# Patient Record
Sex: Female | Born: 1995 | Race: White | Hispanic: No | Marital: Single | Smoking: Never smoker
Health system: Southern US, Community
[De-identification: ages and names within clinical notes are randomized; demographics above are authoritative.]

## PROBLEM LIST (undated history)

## (undated) DIAGNOSIS — T8859XA Other complications of anesthesia, initial encounter: Secondary | ICD-10-CM

## (undated) DIAGNOSIS — R51 Headache: Secondary | ICD-10-CM

## (undated) DIAGNOSIS — E063 Autoimmune thyroiditis: Secondary | ICD-10-CM

## (undated) DIAGNOSIS — F419 Anxiety disorder, unspecified: Secondary | ICD-10-CM

## (undated) DIAGNOSIS — G4700 Insomnia, unspecified: Secondary | ICD-10-CM

## (undated) DIAGNOSIS — N926 Irregular menstruation, unspecified: Secondary | ICD-10-CM

## (undated) DIAGNOSIS — G4730 Sleep apnea, unspecified: Secondary | ICD-10-CM

## (undated) DIAGNOSIS — Z803 Family history of malignant neoplasm of breast: Secondary | ICD-10-CM

## (undated) DIAGNOSIS — Z8719 Personal history of other diseases of the digestive system: Secondary | ICD-10-CM

## (undated) DIAGNOSIS — H539 Unspecified visual disturbance: Secondary | ICD-10-CM

## (undated) DIAGNOSIS — F909 Attention-deficit hyperactivity disorder, unspecified type: Secondary | ICD-10-CM

## (undated) DIAGNOSIS — K219 Gastro-esophageal reflux disease without esophagitis: Secondary | ICD-10-CM

## (undated) DIAGNOSIS — T7840XA Allergy, unspecified, initial encounter: Secondary | ICD-10-CM

## (undated) DIAGNOSIS — E669 Obesity, unspecified: Secondary | ICD-10-CM

## (undated) DIAGNOSIS — I10 Essential (primary) hypertension: Secondary | ICD-10-CM

## (undated) DIAGNOSIS — G932 Benign intracranial hypertension: Secondary | ICD-10-CM

## (undated) DIAGNOSIS — K2970 Gastritis, unspecified, without bleeding: Secondary | ICD-10-CM

## (undated) DIAGNOSIS — F32A Depression, unspecified: Secondary | ICD-10-CM

## (undated) HISTORY — PX: CHOLECYSTECTOMY: SHX55

## (undated) HISTORY — DX: Benign intracranial hypertension: G93.2

## (undated) HISTORY — PX: TONSILLECTOMY: SUR1361

## (undated) HISTORY — DX: Family history of malignant neoplasm of breast: Z80.3

## (undated) HISTORY — DX: Allergy, unspecified, initial encounter: T78.40XA

---
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
## 2014-09-16 NOTE — Op Note (Signed)
PATIENT NAME:  Martha [name] MR#:  [id number] DATE OF BIRTH:  08-17-1995  DATE OF PROCEDURE:  03/12/2012  PREOPERATIVE DIAGNOSIS: Chronic cholecystitis and cholelithiasis.   POSTOPERATIVE DIAGNOSIS: Chronic cholecystitis and cholelithiasis.   OPERATIVE PROCEDURE: Laparoscopic cholecystectomy with intraoperative cholangiograms.   SURGEON: Earline MayotteJeffrey W. Jermone Geister, [name]  ANESTHESIA: General endotracheal under Dr. Chipper OmanKephart  ESTIMATED BLOOD LOSS: Less than 5 mL.   CLINICAL NOTE: This 19 year old woman has had intermittent epigastric and right upper quadrant pain. Ultrasound showed evidence of cholelithiasis. She had previously undergone upper and lower endoscopy without a significant source finding for her symptoms and she was felt to be a candidate for elective cholecystectomy.   OPERATIVE NOTE: With the patient under adequate general endotracheal anesthesia and making use of knee high TED stockings and pneumatic compression stockings due to her morbid obesity for deep vein thrombosis prophylaxis, she underwent general endotracheal anesthesia without difficulty. The abdomen was prepped with ChloraPrep and draped. In Trendelenburg position, a Veress needle was placed through a transumbilical incision. After assuring intra-abdominal location with the hanging drop test, the abdomen was insufflated with CO2 at 12 mmHg pressure. A 10 mm step port was expanded and inspection showed no evidence of injury from initial port placement. An incidental finding was the identification of a patent processus vaginalis on the right side. The left side could not be visualized.   The patient was placed in reverse Trendelenburg position and rolled to the left. Gallbladder was placed on cephalad traction. Due to the generous volume of intra-abdominal adipose tissue a fifth port was placed along the anterior axillary line. A Peer retractor was used to displace the colon and duodenum medially. This allowed good visualization of the  neck of the gallbladder. The cystic duct was cleared and a cystic duct lymph node which was enlarged was removed and sent with the specimen for routine histology. Fluoroscopic cholangiograms were completed using 18 mL of one half strength Conray 60. Prompt filling of the right and left hepatic ducts and free flow into the duodenum without evidence of retained stones were seen. The cystic duct and two branches of the cystic artery were doubly clipped adjacent to the gallbladder. It was then removed from the liver bed making use of hook cautery dissection. It was placed in an Endo Catch bag due to the generous volume of adipose tissue to negotiate the gallbladder through the abdominal wall. The umbilical port site was gently spread and it was possible to deliver the gallbladder and multiple stones with the use of stone forceps. After re-establishing pneumoperitoneum the right upper quadrant was irrigated with lactated Ringer's solution. Excellent hemostasis was noted. The abdomen was then desufflated and ports removed under direct vision. Skin incisions were closed with 4-0 Vicryl subcuticular suture. Benzoin, Steri-Strips, Telfa, and Tegaderm dressing was then applied.   Patient tolerated procedure well and was taken to recovery room in stable condition.   ____________________________ Earline MayotteJeffrey W. Bastian Andreoli, [name] jwb:cms D: 03/12/2012 13:06:35 ET T: 03/12/2012 13:24:26 ET JOB#: [id number]  cc: Earline MayotteJeffrey W. Marven Veley, [name], <Dictator> Martha ParisElizabeth B. Cliffton AstersWhite, NP Christena DeemMartin U. Skulskie, [name]  Martha [name] ELECTRONICALLY SIGNED 03/12/2012 19:42

## 2015-02-23 ENCOUNTER — Encounter: Payer: Self-pay | Admitting: Emergency Medicine

## 2015-02-23 ENCOUNTER — Emergency Department
Admission: EM | Admit: 2015-02-23 | Discharge: 2015-02-23 | Disposition: A | Discharge status: Home / Self Care | Payer: BLUE CROSS/BLUE SHIELD | Attending: Emergency Medicine | Admitting: Emergency Medicine

## 2015-02-23 DIAGNOSIS — E11649 Type 2 diabetes mellitus with hypoglycemia without coma: Secondary | ICD-10-CM | POA: Insufficient documentation

## 2015-02-23 DIAGNOSIS — Z79899 Other long term (current) drug therapy: Secondary | ICD-10-CM | POA: Insufficient documentation

## 2015-02-23 DIAGNOSIS — E162 Hypoglycemia, unspecified: Secondary | ICD-10-CM

## 2015-02-23 DIAGNOSIS — R4182 Altered mental status, unspecified: Secondary | ICD-10-CM | POA: Diagnosis present

## 2015-02-23 HISTORY — DX: Insomnia, unspecified: G47.00

## 2015-02-23 LAB — COMPREHENSIVE METABOLIC PANEL
ALT: 29 U/L (ref 14–54)
ANION GAP: 9 (ref 5–15)
AST: 25 U/L (ref 15–41)
Albumin: 4 g/dL (ref 3.5–5.0)
Alkaline Phosphatase: 50 U/L (ref 38–126)
BUN: 8 mg/dL (ref 6–20)
CHLORIDE: 102 mmol/L (ref 101–111)
CO2: 28 mmol/L (ref 22–32)
CREATININE: 0.77 mg/dL (ref 0.44–1.00)
Calcium: 9.6 mg/dL (ref 8.9–10.3)
Glucose, Bld: 100 mg/dL — ABNORMAL HIGH (ref 65–99)
POTASSIUM: 3.7 mmol/L (ref 3.5–5.1)
SODIUM: 139 mmol/L (ref 135–145)
Total Bilirubin: 0.2 mg/dL — ABNORMAL LOW (ref 0.3–1.2)
Total Protein: 7.8 g/dL (ref 6.5–8.1)

## 2015-02-23 LAB — CBC
HEMATOCRIT: 38.8 % (ref 35.0–47.0)
HEMOGLOBIN: 12.3 g/dL (ref 12.0–16.0)
MCH: 26 pg (ref 26.0–34.0)
MCHC: 31.7 g/dL — ABNORMAL LOW (ref 32.0–36.0)
MCV: 82 fL (ref 80.0–100.0)
PLATELETS: 354 10*3/uL (ref 150–440)
RBC: 4.73 MIL/uL (ref 3.80–5.20)
RDW: 14.9 % — ABNORMAL HIGH (ref 11.5–14.5)
WBC: 11.4 10*3/uL — AB (ref 3.6–11.0)

## 2015-02-23 LAB — TSH: TSH: 2.405 u[IU]/mL (ref 0.350–4.500)

## 2015-02-23 LAB — GLUCOSE, CAPILLARY
GLUCOSE-CAPILLARY: 67 mg/dL (ref 65–99)
GLUCOSE-CAPILLARY: 63 mg/dL — AB (ref 65–99)
Glucose-Capillary: 39 mg/dL — CL (ref 65–99)
Glucose-Capillary: 90 mg/dL (ref 65–99)

## 2015-02-23 NOTE — ED Notes (Signed)
CBG-90, Dr. Huel Cote made aware.

## 2015-02-23 NOTE — Discharge Instructions (Signed)
Blood Glucose Monitoring °Monitoring your blood glucose (also know as blood sugar) helps you to manage your diabetes. It also helps you and your health care provider monitor your diabetes and determine how well your treatment plan is working. °WHY SHOULD YOU MONITOR YOUR BLOOD GLUCOSE? °· It can help you understand how food, exercise, and medicine affect your blood glucose. °· It allows you to know what your blood glucose is at any given moment. You can quickly tell if you are having low blood glucose (hypoglycemia) or high blood glucose (hyperglycemia). °· It can help you and your health care provider know how to adjust your medicines. °· It can help you understand how to manage an illness or adjust medicine for exercise. °WHEN SHOULD YOU TEST? °Your health care provider will help you decide how often you should check your blood glucose. This may depend on the type of diabetes you have, your diabetes control, or the types of medicines you are taking. Be sure to write down all of your blood glucose readings so that this information can be reviewed with your health care provider. See below for examples of testing times that your health care provider may suggest. °Type 1 Diabetes °· Test 4 times a day if you are in good control, using an insulin pump, or perform multiple daily injections. °· If your diabetes is not well controlled or if you are sick, you may need to monitor more often. °· It is a good idea to also monitor: °· Before and after exercise. °· Between meals and 2 hours after a meal. °· Occasionally between 2:00 a.m. and 3:00 a.m. °Type 2 Diabetes °· It can vary with each person, but generally, if you are on insulin, test 4 times a day. °· If you take medicines by mouth (orally), test 2 times a day. °· If you are on a controlled diet, test once a day. °· If your diabetes is not well controlled or if you are sick, you may need to monitor more often. °HOW TO MONITOR YOUR BLOOD GLUCOSE °Supplies  Needed °· Blood glucose meter. °· Test strips for your meter. Each meter has its own strips. You must use the strips that go with your own meter. °· A pricking needle (lancet). °· A device that holds the lancet (lancing device). °· A journal or log book to write down your results. °Procedure °· Wash your hands with soap and water. Alcohol is not preferred. °· Prick the side of your finger (not the tip) with the lancet. °· Gently milk the finger until a small drop of blood appears. °· Follow the instructions that come with your meter for inserting the test strip, applying blood to the strip, and using your blood glucose meter. °Other Areas to Get Blood for Testing °Some meters allow you to use other areas of your body (other than your finger) to test your blood. These areas are called alternative sites. The most common alternative sites are: °· The forearm. °· The thigh. °· The back area of the lower leg. °· The palm of the hand. °The blood flow in these areas is slower. Therefore, the blood glucose values you get may be delayed, and the numbers are different from what you would get from your fingers. Do not use alternative sites if you think you are having hypoglycemia. Your reading will not be accurate. Always use a finger if you are having hypoglycemia. Also, if you cannot feel your lows (hypoglycemia unawareness), always use your fingers for your   blood glucose checks. °ADDITIONAL TIPS FOR GLUCOSE MONITORING °· Do not reuse lancets. °· Always carry your supplies with you. °· All blood glucose meters have a 24-hour "hotline" number to call if you have questions or need help. °· Adjust (calibrate) your blood glucose meter with a control solution after finishing a few boxes of strips. °BLOOD GLUCOSE RECORD KEEPING °It is a good idea to keep a daily record or log of your blood glucose readings. Most glucose meters, if not all, keep your glucose records stored in the meter. Some meters come with the ability to download  your records to your home computer. Keeping a record of your blood glucose readings is especially helpful if you are wanting to look for patterns. Make notes to go along with the blood glucose readings because you might forget what happened at that exact time. Keeping good records helps you and your health care provider to work together to achieve good diabetes management.  °Document Released: 05/19/2003 Document Revised: 09/30/2013 Document Reviewed: 10/08/2012 °[employer]® Patient Information ©2015 [employer]. This information is not intended to replace advice given to you by your health care provider. Make sure you discuss any questions you have with your health care provider. ° °Hypoglycemia °Hypoglycemia occurs when the glucose in your blood is too low. Glucose is a type of sugar that is your body's main energy source. Hormones, such as insulin and glucagon, control the level of glucose in the blood. Insulin lowers blood glucose and glucagon increases blood glucose. Having too much insulin in your blood stream, or not eating enough food containing sugar, can result in hypoglycemia. Hypoglycemia can happen to people with or without diabetes. It can develop quickly and can be a medical emergency.  °CAUSES  °· Missing or delaying meals. °· Not eating enough carbohydrates at meals. °· Taking too much diabetes medicine. °· Not timing your oral diabetes medicine or insulin doses with meals, snacks, and exercise. °· Nausea and vomiting. °· Certain medicines. °· Severe illnesses, such as hepatitis, kidney disorders, and certain eating disorders. °· Increased activity or exercise without eating something extra or adjusting medicines. °· Drinking too much alcohol. °· A nerve disorder that affects body functions like your heart rate, blood pressure, and digestion (autonomic neuropathy). °· A condition where the stomach muscles do not function properly (gastroparesis). Therefore, medicines and food may not absorb  properly. °· Rarely, a tumor of the pancreas can produce too much insulin. °SYMPTOMS  °· Hunger. °· Sweating (diaphoresis). °· Change in body temperature. °· Shakiness. °· Headache. °· Anxiety. °· Lightheadedness. °· Irritability. °· Difficulty concentrating. °· Dry mouth. °· Tingling or numbness in the hands or feet. °· Restless sleep or sleep disturbances. °· Altered speech and coordination. °· Change in mental status. °· Seizures or prolonged convulsions. °· Combativeness. °· Drowsiness (lethargic). °· Weakness. °· Increased heart rate or palpitations. °· Confusion. °· Pale, gray skin color. °· Blurred or double vision. °· Fainting. °DIAGNOSIS  °A physical exam and medical history will be performed. Your caregiver may make a diagnosis based on your symptoms. Blood tests and other lab tests may be performed to confirm a diagnosis. Once the diagnosis is made, your caregiver will see if your signs and symptoms go away once your blood glucose is raised.  °TREATMENT  °Usually, you can easily treat your hypoglycemia when you notice symptoms. °· Check your blood glucose. If it is less than 70 mg/dl, take one of the following:   °¨ 3-4 glucose tablets.   °¨ ½ cup   juice.    cup regular soda.   1 cup skim milk.   -1 tube of glucose gel.   5-6 hard candies.   Avoid high-fat drinks or food that may delay a rise in blood glucose levels.  Do not take more than the recommended amount of sugary foods, drinks, gel, or tablets. Doing so will cause your blood glucose to go too high.   Wait 10-15 minutes and recheck your blood glucose. If it is still less than 70 mg/dl or below your target range, repeat treatment.   Eat a snack if it is more than 1 hour until your next meal.  There may be a time when your blood glucose may go so low that you are unable to treat yourself at home when you start to notice symptoms. You may need someone to help you. You may even faint or be unable to swallow. If you cannot  treat yourself, someone will need to bring you to the hospital.  HOME CARE INSTRUCTIONS  If you have diabetes, follow your diabetes management plan by:  Taking your medicines as directed.  Following your exercise plan.  Following your meal plan. Do not skip meals. Eat on time.  Testing your blood glucose regularly. Check your blood glucose before and after exercise. If you exercise longer or different than usual, be sure to check blood glucose more frequently.  Wearing your medical alert jewelry that says you have diabetes.  Identify the cause of your hypoglycemia. Then, develop ways to prevent the recurrence of hypoglycemia.  Do not take a hot bath or shower right after an insulin shot.  Always carry treatment with you. Glucose tablets are the easiest to carry.  If you are going to drink alcohol, drink it only with meals.  Tell friends or family members ways to keep you safe during a seizure. This may include removing hard or sharp objects from the area or turning you on your side.  Maintain a healthy weight. SEEK MEDICAL CARE IF:   You are having problems keeping your blood glucose in your target range.  You are having frequent episodes of hypoglycemia.  You feel you might be having side effects from your medicines.  You are not sure why your blood glucose is dropping so low.  You notice a change in vision or a new problem with your vision. SEEK IMMEDIATE MEDICAL CARE IF:   Confusion develops.  A change in mental status occurs.  The inability to swallow develops.  Fainting occurs. Document Released: 05/16/2005 Document Revised: 05/21/2013 Document Reviewed: 09/12/2011 [facility] Patient Information 2015 [place], [place]. This information is not intended to replace advice given to you by your health care provider. Make sure you discuss any questions you have with your health care provider.  Please return immediately if condition worsens. Please contact her primary  physician or the physician you were given for referral. If you have any specialist physicians involved in her treatment and plan please also contact them. Thank you for using [facility]. Please stop the Lamisil

## 2015-02-23 NOTE — ED Notes (Addendum)
Patient was given graham crackers and peanut butter. Patient is alert, pleasant. Patient's mother is with patient in the waiting room. Charge nurse is aware.

## 2015-02-23 NOTE — ED Notes (Addendum)
Pt reports last week at school she had an episode of being confused. She was writing and and then all then she didn't understand what she was writing. States, "I noticed I was writing jibberish" Pt reports she went home and told her mom. Pt denies pain, other sx's. Pt reports diabetes runs in the family so she thought maybe her sugar ran low. Denies HA, [place].

## 2015-02-23 NOTE — ED Notes (Signed)
Main lab notified to add TSH, spoke with Morrie Sheldon, states will add.

## 2015-02-24 NOTE — ED Provider Notes (Signed)
Time Seen: Approximately 1620 I have reviewed the triage notes  Chief Complaint: Altered Mental Status   History of Present Illness: Martha [name] is a 19 y.o. female *who describes some periodic episodes mainly at school when work where she intermittently feels confused and feels like she is going to pass out. She states one time she noticed that she was writing "" gibberish" while taking notes in class. Patient had her blood sugar checked prior to arrival which showed that it was slow. She states she was recently started on medication for fungus in her toenails. Medication turns out to be Lamisil. She states these episodes didn't start until after she had started taking Lamisil. She denies taking any other medications or insulin that would lower her blood sugar. She denies any chest pain or shortness of breath or heart palpitations with these episodes. No obvious witnessed seizure activity. Past Medical History  Diagnosis Date  . Anxiety   . Diabetes mellitus     pre-diabetic,told when younger  . ADHD (attention deficit hyperactivity disorder)     no meds taken  . Vision abnormalities     near sighted,wears glasses  . Obesity   . Irregular menstrual cycle     pt states she started BCP,unsure of name to help regulate cycle  . Headache(784.0)     chronic migraines, decrease since BCP started  . Insomnia     Patient Active Problem List   Diagnosis Date Noted  . Suicidal ideation 05/10/2011  . Depression 05/10/2011    Past Surgical History  Procedure Laterality Date  . Tonsillectomy      at 19yo  . Cholecystectomy    . Hernia repair      Past Surgical History  Procedure Laterality Date  . Tonsillectomy      at 19yo  . Cholecystectomy    . Hernia repair      Current Outpatient Rx  Name  Route  Sig  Dispense  Refill  . aspirin-acetaminophen-caffeine (EXCEDRIN MIGRAINE) 250-250-65 MG per tablet   Oral   Take 2 tablets by mouth every 6 (six) hours as needed. For  migraines.          . DULoxetine (CYMBALTA) 30 MG capsule   Oral   Take 90 mg by mouth every morning.           . norethindrone (JOLIVETTE) 0.35 MG tablet   Oral   Take 1 tablet by mouth daily.           Marland Kitchen zolpidem (AMBIEN) 10 MG tablet   Oral   Take 10 mg by mouth at bedtime.             Allergies:  Review of patient's allergies indicates no known allergies.  Family History: History reviewed. No pertinent family history.  Social History: Social History  Substance Use Topics  . Smoking status: Never Smoker   . Smokeless tobacco: None  . Alcohol Use: No     Review of Systems:   10 point review of systems was performed and was otherwise negative:  Constitutional: No fever Eyes: No visual disturbances ENT: No sore throat, ear pain Cardiac: No chest pain Respiratory: No shortness of breath, wheezing, or stridor Abdomen: No abdominal pain, no vomiting, No diarrhea Endocrine: No weight loss, No night sweats Extremities: No peripheral edema, cyanosis Skin: No rashes, easy bruising Neurologic: No focal weakness, trouble with speech or swollowing Urologic: No dysuria, Hematuria, or urinary frequency   Physical Exam:  ED Triage  Vitals  Enc Vitals Group     BP 02/23/15 1324 135/73 mmHg     Pulse Rate 02/23/15 1324 77     Resp 02/23/15 1324 18     Temp 02/23/15 1324 98.6 F (37 C)     Temp Source 02/23/15 1324 Oral     SpO2 02/23/15 1324 100 %     Weight 02/23/15 1324 325 lb (147.419 kg)     Height 02/23/15 1324  (1.753 m)     Head Cir --      Peak Flow --      Pain Score 02/23/15 1619 0     Pain Loc --      Pain Edu? --      Excl. in GC? --     General: Awake , Alert , and Oriented times 3; GCS 15 Head: Normal cephalic , atraumatic Eyes: Pupils equal , round, reactive to light Nose/Throat: No nasal drainage, patent upper airway without erythema or exudate.  Neck: Supple, Full range of motion, No anterior adenopathy or palpable thyroid  masses Lungs: Clear to ascultation without wheezes , rhonchi, or rales Heart: Regular rate, regular rhythm without murmurs , gallops , or rubs Abdomen: Soft, non tender without rebound, guarding , or rigidity; bowel sounds positive and symmetric in all 4 quadrants. No organomegaly .        Extremities: 2 plus symmetric pulses. No edema, clubbing or cyanosis Neurologic: normal ambulation, Motor symmetric without deficits, sensory intact Skin: warm, dry, no rashes   Labs:   All laboratory work was reviewed including any pertinent negatives or positives listed below:  Labs Reviewed  COMPREHENSIVE METABOLIC PANEL - Abnormal; Notable for the following:    Glucose, Bld 100 (*)    Total Bilirubin 0.2 (*)    All other components within normal limits  CBC - Abnormal; Notable for the following:    WBC 11.4 (*)    MCHC 31.7 (*)    RDW 14.9 (*)    All other components within normal limits  GLUCOSE, CAPILLARY - Abnormal; Notable for the following:    Glucose-Capillary 39 (*)    All other components within normal limits  GLUCOSE, CAPILLARY - Abnormal; Notable for the following:    Glucose-Capillary 63 (*)    All other components within normal limits  GLUCOSE, CAPILLARY  TSH  GLUCOSE, CAPILLARY  CBG MONITORING, ED   laboratory work reviewed shows some mild hypoglycemia otherwise no significant abnormalities    ED Course Patient's stay here was uneventful and her blood sugar was repeated periodically during her stay and showed to reach some form of steady state at of 100. Review of her medication (Lamisil) shows that he can have an side effect of hypoglycemia. Patient was advised to stop the medication and see if this does improve her situations. I did not see any other life-threatening cause for her near syncope at this time.    Assessment:  Hypoglycemia secondary to medication   Final Clinical Impression:   Final diagnoses:  Hypoglycemia     Plan:  Patient was advised to return  immediately if condition worsens. Patient was advised to follow up with her primary care physician or other specialized physicians involved and in their current assessment.            Jennye Moccasin, MD 02/24/15 [phone]

## 2015-07-06 DIAGNOSIS — K219 Gastro-esophageal reflux disease without esophagitis: Secondary | ICD-10-CM | POA: Insufficient documentation

## 2015-07-06 DIAGNOSIS — R030 Elevated blood-pressure reading, without diagnosis of hypertension: Secondary | ICD-10-CM | POA: Insufficient documentation

## 2015-10-07 DIAGNOSIS — K449 Diaphragmatic hernia without obstruction or gangrene: Secondary | ICD-10-CM | POA: Insufficient documentation

## 2016-10-25 ENCOUNTER — Telehealth: Payer: Self-pay

## 2016-10-25 NOTE — Telephone Encounter (Signed)
Pt called stating she was given Introvale and her normal is Scientist, research (physical sciences)Quasense.  Adv generic equivalent.

## 2016-10-25 NOTE — Telephone Encounter (Signed)
Pt called stating pharm gave her a totally different bcp than what she has been taking.  Wants to know if it's the same thing.  Left detailed msg to call back c the name of the med she recv'd and the name she was supposed to receive.

## 2016-12-27 ENCOUNTER — Other Ambulatory Visit: Payer: Self-pay | Admitting: Family Medicine

## 2016-12-27 DIAGNOSIS — R222 Localized swelling, mass and lump, trunk: Secondary | ICD-10-CM

## 2016-12-30 ENCOUNTER — Other Ambulatory Visit: Payer: Self-pay | Admitting: Family Medicine

## 2016-12-30 DIAGNOSIS — R229 Localized swelling, mass and lump, unspecified: Secondary | ICD-10-CM

## 2016-12-30 DIAGNOSIS — R222 Localized swelling, mass and lump, trunk: Secondary | ICD-10-CM

## 2017-01-02 ENCOUNTER — Ambulatory Visit
Admission: RE | Admit: 2017-01-02 | Discharge: 2017-01-02 | Disposition: A | Discharge status: Home / Self Care | Payer: BLUE CROSS/BLUE SHIELD | Source: Ambulatory Visit | Attending: Family Medicine | Admitting: Family Medicine

## 2017-01-02 DIAGNOSIS — R222 Localized swelling, mass and lump, trunk: Secondary | ICD-10-CM | POA: Insufficient documentation

## 2017-01-02 DIAGNOSIS — R229 Localized swelling, mass and lump, unspecified: Secondary | ICD-10-CM

## 2017-04-07 ENCOUNTER — Encounter: Payer: Self-pay | Admitting: Obstetrics and Gynecology

## 2017-04-07 ENCOUNTER — Ambulatory Visit (INDEPENDENT_AMBULATORY_CARE_PROVIDER_SITE_OTHER): Payer: BLUE CROSS/BLUE SHIELD | Admitting: Obstetrics and Gynecology

## 2017-04-07 VITALS — BP 126/82 | HR 94 | Ht 69.0 in | Wt 371.0 lb

## 2017-04-07 DIAGNOSIS — Z3041 Encounter for surveillance of contraceptive pills: Secondary | ICD-10-CM | POA: Diagnosis not present

## 2017-04-07 DIAGNOSIS — Z01419 Encounter for gynecological examination (general) (routine) without abnormal findings: Secondary | ICD-10-CM

## 2017-04-07 DIAGNOSIS — Z0000 Encounter for general adult medical examination without abnormal findings: Secondary | ICD-10-CM

## 2017-04-07 MED ORDER — INTROVALE 0.15-0.03 MG PO TABS: 1.0000 | ORAL_TABLET | Freq: Every day | ORAL | 4 refills | Status: DC

## 2017-04-07 NOTE — Progress Notes (Signed)
Gynecology Annual Exam  [place]: [place], [facility]  Chief Complaint:  Chief Complaint  Patient presents with  . Gynecologic Exam    History of Present Illness: Patient is a 21 y.o. No obstetric history on file. presents for annual exam. The patient has no complaints today.   LMP: No LMP recorded. Patient is not currently having periods (Reason: Oral contraceptives). Average Interval: regular, 91 days Duration of flow: 4 days Heavy Menses: no Clots: no Intermenstrual Bleeding: no Postcoital Bleeding: not applicable Dysmenorrhea: no  The patient has never been sexually active. She currently uses OCP (estrogen/progesterone) for contraception. She denies dyspareunia.  The patient does perform self breast exams.  There is notable family history of breast or ovarian cancer in her family.  The patient wears seatbelts: yes.  The patient has regular exercise: no.    The patient repots current symptoms of depression.  She is seeing a physiatrist regularly. She has had recent stressors including her mother needing emergency back surgery. She recently changed antidepressant and sleep aide medication and feels like her mood has improved.   Review of Systems: Review of Systems  Constitutional: Negative.   HENT: Negative.   Eyes: Negative.   Respiratory: Negative.   Cardiovascular: Negative.   Gastrointestinal: Negative.   Genitourinary: Negative.   Musculoskeletal: Negative.   Skin: Negative.   Neurological: Negative.   Endo/Heme/Allergies: Bruises/bleeds easily.  Psychiatric/Behavioral: Positive for depression. Negative for substance abuse and suicidal ideas. The patient does not have insomnia.     Past Medical History:  Past Medical History:  Diagnosis Date  . ADHD (attention deficit hyperactivity disorder)    no meds taken  . Anxiety   . Diabetes mellitus    pre-diabetic,told when younger  . Headache(784.0)    chronic migraines, decrease since BCP started  .  Insomnia   . Irregular menstrual cycle    pt states she started BCP,unsure of name to help regulate cycle  . Obesity   . Vision abnormalities    near sighted,wears glasses    Past Surgical History:  Past Surgical History:  Procedure Laterality Date  . CHOLECYSTECTOMY    . HERNIA REPAIR    . TONSILLECTOMY     at 21yo    Gynecologic History:  No LMP recorded. Patient is not currently having periods (Reason: Oral contraceptives). Contraception: OCP (estrogen/progesterone) Last Pap: Results were: NIL   Obstetric History: No obstetric history on file.  Family History:  No family history on file.  Social History:  Social History   Socioeconomic History  . Marital status: Single    Spouse name: Not on file  . Number of children: Not on file  . Years of education: Not on file  . Highest education level: Not on file  Social Needs  . Financial resource strain: Not on file  . Food insecurity - worry: Not on file  . Food insecurity - inability: Not on file  . Transportation needs - medical: Not on file  . Transportation needs - non-medical: Not on file  Occupational History  . Occupation: Consulting civil engineertudent    Comment: [employer] 12th grade  Tobacco Use  . Smoking status: Never Smoker  . Smokeless tobacco: Never Used  Substance and Sexual Activity  . Alcohol use: Yes    Comment: occ  . Drug use: No  . Sexual activity: Not Currently    Partners: Male    Birth control/protection: Pill  Other Topics Concern  . Not on file  Social  History Narrative  . Not on file    Allergies:  Allergies  Allergen Reactions  . Lamisil [Terbinafine Hcl]     Medications: Prior to Admission medications   Medication Sig Start Date End Date Taking? Authorizing Provider  aspirin-acetaminophen-caffeine (EXCEDRIN MIGRAINE) [phone] MG per tablet Take 2 tablets by mouth every 6 (six) hours as needed. For migraines.    Yes [provider]  desvenlafaxine (PRISTIQ) 50 MG 24 hr tablet  Take by mouth.   Yes [provider]  INTROVALE 0.15-0.03 MG tablet TK 1 T PO  QD FOR 91 DAYS 01/19/17  Yes [provider]  lansoprazole (PREVACID) 30 MG capsule Take by mouth. 07/04/16 07/04/17 Yes [provider]  lansoprazole (PREVACID) 30 MG capsule  04/01/17  Yes [provider]  methylphenidate (RITALIN) 5 MG tablet Take by mouth.   Yes [provider]  zaleplon (SONATA) 10 MG capsule TK 2 CS PO QHS 03/29/17  Yes [provider]    Physical Exam Vitals: Blood pressure 126/82, pulse 94, height 5\' 9"  (1.753 m), weight (!) 371 lb (168.3 kg).  General: NAD HEENT: normocephalic, anicteric Thyroid: no enlargement, no palpable nodules Pulmonary: No increased work of breathing, CTAB Cardiovascular: RRR, distal pulses 2+ Breast: Breast symmetrical, no tenderness, no palpable nodules or masses, no skin or nipple retraction present, no nipple discharge.  No axillary or supraclavicular lymphadenopathy. Abdomen: NABS, soft, non-tender, non-distended.  Umbilicus without lesions.  No hepatomegaly, splenomegaly or masses palpable. No evidence of hernia  Genitourinary:  External: Normal external female genitalia.  Normal urethral meatus, normal  Bartholin's and Skene's glands.    Vagina: Normal vaginal mucosa, no evidence of prolapse.    Uterus: Non-enlarged, mobile, normal contour.  No CMT  Adnexa: ovaries non-enlarged, no adnexal masses  Rectal: deferred  Lymphatic: no evidence of inguinal lymphadenopathy Extremities: no edema, erythema, or tenderness Neurologic: Grossly intact Psychiatric: mood appropriate, affect full  Female chaperone present for pelvic and breast  portions of the physical exam    Assessment: 21 y.o. No obstetric history on file. routine annual exam  Plan: Problem List Items Addressed This Visit    None      1) 4) Gardasil Series discussed and if applicable offered to patient - Patient has previously completed 3  shot series   2) STI screening was offered and declined   3) ASCCP guidelines and rational discussed.  Patient opts for every 3 years screening interval  4) Contraception -will continue current contraception method   5) Follow up 1 year for routine annual exam

## 2017-04-17 ENCOUNTER — Other Ambulatory Visit: Payer: Self-pay | Admitting: Advanced Practice Midwife

## 2017-09-26 ENCOUNTER — Emergency Department
Admission: EM | Admit: 2017-09-26 | Discharge: 2017-09-26 | Disposition: A | Discharge status: Home / Self Care | Payer: 59 | Attending: Emergency Medicine | Admitting: Emergency Medicine

## 2017-09-26 ENCOUNTER — Other Ambulatory Visit: Payer: Self-pay

## 2017-09-26 ENCOUNTER — Encounter: Payer: Self-pay | Admitting: *Deleted

## 2017-09-26 DIAGNOSIS — Z79899 Other long term (current) drug therapy: Secondary | ICD-10-CM | POA: Insufficient documentation

## 2017-09-26 DIAGNOSIS — L509 Urticaria, unspecified: Secondary | ICD-10-CM | POA: Insufficient documentation

## 2017-09-26 MED ORDER — PREDNISONE 50 MG PO TABS: 50.0000 mg | ORAL_TABLET | Freq: Every day | ORAL | 0 refills | Status: DC

## 2017-09-26 MED ORDER — METHYLPREDNISOLONE SODIUM SUCC 125 MG IJ SOLR
125.0000 mg | Freq: Once | INTRAMUSCULAR | Status: AC
  Administered 2017-09-26: 125 mg via INTRAMUSCULAR
  Filled 2017-09-26: qty 2

## 2017-09-26 MED ORDER — FAMOTIDINE 20 MG PO TABS
20.0000 mg | ORAL_TABLET | Freq: Once | ORAL | Status: AC
  Administered 2017-09-26: 20 mg via ORAL
  Filled 2017-09-26: qty 1

## 2017-09-26 MED ORDER — HYDROXYZINE PAMOATE 50 MG PO CAPS: 50.0000 mg | ORAL_CAPSULE | Freq: Three times a day (TID) | ORAL | 0 refills | Status: DC | PRN

## 2017-09-26 MED ORDER — DIPHENHYDRAMINE HCL 50 MG/ML IJ SOLN
50.0000 mg | Freq: Once | INTRAMUSCULAR | Status: AC
  Administered 2017-09-26: 50 mg via INTRAMUSCULAR
  Filled 2017-09-26: qty 1

## 2017-09-26 NOTE — ED Provider Notes (Signed)
[facility] Emergency Department Provider Note  ____________________________________________  Time seen: Approximately 10:23 PM  I have reviewed the triage vital signs and the nursing notes.   HISTORY  Chief Complaint Rash    HPI Martha [name] is a 22 y.o. female who presents emergency department complaining of hives.  Patient reports that over the past couple months she has had several outbreaks of hives with unknown trigger.  Last occurrence happened several months ago.  Patient was given a course of steroids which improved symptoms and she was placed on daily Zyrtec.  Until now, no return of symptoms.  No new foods, medications, beauty products.  Patient denies any angioedema-like symptoms, shortness of breath or difficulty breathing.  Patient reports that hives have improved without medication but she still is pruritic.  No other complaints at this time.  Past Medical History:  Diagnosis Date  . ADHD (attention deficit hyperactivity disorder)    no meds taken  . Anxiety   . Diabetes mellitus    pre-diabetic,told when younger  . Headache(784.0)    chronic migraines, decrease since BCP started  . Insomnia   . Irregular menstrual cycle    pt states she started BCP,unsure of name to help regulate cycle  . Obesity   . Vision abnormalities    near sighted,wears glasses    Patient Active Problem List   Diagnosis Date Noted  . Suicidal ideation 05/10/2011  . Depression 05/10/2011    Past Surgical History:  Procedure Laterality Date  . CHOLECYSTECTOMY    . TONSILLECTOMY     at 22yo    Prior to Admission medications   Medication Sig Start Date End Date Taking? Authorizing Provider  aspirin-acetaminophen-caffeine (EXCEDRIN MIGRAINE) [phone] MG per tablet Take 2 tablets by mouth every 6 (six) hours as needed. For migraines.     [provider]  desvenlafaxine (PRISTIQ) 50 MG 24 hr tablet Take by mouth.    [provider]   hydrOXYzine (VISTARIL) 50 MG capsule Take 1 capsule (50 mg total) by mouth 3 (three) times daily as needed for itching. 09/26/17   Cuthriell, Delorise Royals, PA-C  INTROVALE 0.15-0.03 MG tablet Take 1 tablet daily by mouth. 04/07/17   Schuman, Jaquelyn Bitter, MD  lansoprazole (PREVACID) 30 MG capsule Take by mouth. 07/04/16 07/04/17  [provider]  lansoprazole (PREVACID) 30 MG capsule  04/01/17   [provider]  methylphenidate (RITALIN) 5 MG tablet Take by mouth.    [provider]  predniSONE (DELTASONE) 50 MG tablet Take 1 tablet (50 mg total) by mouth daily with breakfast. 09/26/17   Cuthriell, Delorise Royals, PA-C  zaleplon (SONATA) 10 MG capsule TK 2 CS PO QHS 03/29/17   [provider]    Allergies Lamisil [terbinafine]  Family History  Problem Relation Age of Onset  . Thyroid cancer Mother 37  . Leukemia Paternal Aunt   . Breast cancer Paternal Aunt 40  . Skin cancer Maternal Grandfather     Social History Social History   Tobacco Use  . Smoking status: Never Smoker  . Smokeless tobacco: Never Used  Substance Use Topics  . Alcohol use: Yes    Comment: occ  . Drug use: No     Review of Systems  Constitutional: No fever/chills Eyes: No visual changes. No discharge ENT:  no swelling or itching of the airway Cardiovascular: no chest pain. Respiratory: no cough. No SOB. Gastrointestinal: No abdominal pain.  No nausea, no vomiting.  No diarrhea.  No  constipation. Musculoskeletal: Negative for musculoskeletal pain. Skin: Positive for hives to the upper extremities, chest Neurological: Negative for headaches, focal weakness or numbness. 10-point ROS otherwise negative.  ____________________________________________   PHYSICAL EXAM:  VITAL SIGNS: ED Triage Vitals  Enc Vitals Group     BP 09/26/17 2027 (!) 167/87     Pulse Rate 09/26/17 2027 92     Resp 09/26/17 2027 20     Temp 09/26/17 2023 98.6 F (37 C)     Temp Source 09/26/17 2023  Oral     SpO2 09/26/17 2027 99 %     Weight 09/26/17 2023 (!) 400 lb (181.4 kg)     Height 09/26/17 2023  (1.753 m)     Head Circumference --      Peak Flow --      Pain Score 09/26/17 2026 0     Pain Loc --      Pain Edu? --      Excl. in GC? --      Constitutional: Alert and oriented. Well appearing and in no acute distress. Eyes: Conjunctivae are normal. PERRL. EOMI. Head: Atraumatic. ENT:      Ears:       Nose: No congestion/rhinnorhea.      Mouth/Throat: Mucous membranes are moist.  No angioedema or oropharyngeal erythema or edema.  Neck: No stridor.   Cardiovascular: Normal rate, regular rhythm. Normal S1 and S2.  Good peripheral circulation. Respiratory: Normal respiratory effort without tachypnea or retractions. Lungs CTAB. Good air entry to the bases with no decreased or absent breath sounds. Musculoskeletal: Full range of motion to all extremities. No gross deformities appreciated. Neurologic:  Normal speech and language. No gross focal neurologic deficits are appreciated.  Skin:  Skin is warm, dry and intact.  Wheals noted to bilateral upper extremities, chest region.  These are not erythematous.  No pustules, open lesions.  Signs of infection.  No drainage. Psychiatric: Mood and affect are normal. Speech and behavior are normal. Patient exhibits appropriate insight and judgement.   ____________________________________________   LABS (all labs ordered are listed, but only abnormal results are displayed)  Labs Reviewed - No data to display ____________________________________________  EKG   ____________________________________________  RADIOLOGY   No results found.  ____________________________________________    PROCEDURES  Procedure(s) performed:    Procedures    Medications  methylPREDNISolone sodium succinate (SOLU-MEDROL) 125 mg/2 mL injection 125 mg (has no administration in time range)  diphenhydrAMINE (BENADRYL) injection 50 mg (has  no administration in time range)  famotidine (PEPCID) tablet 20 mg (has no administration in time range)     ____________________________________________   INITIAL IMPRESSION / ASSESSMENT AND PLAN / ED COURSE  Pertinent labs & imaging results that were available during my care of the patient were reviewed by me and considered in my medical decision making (see chart for details).  Review of the [place] CSRS was performed in accordance of the [facility] prior to dispensing any controlled drugs.     Patient's diagnosis is consistent with hives.  Patient presents with onset of hives.  No known trigger.  This is occurred several times in the past.  At this time, patient reports that she has been advised that if this recurs, she should follow-up with ENT for further management.  At this time, she will be given Solu-Medrol, diphenhydramine, Zantac.Marland Kitchen Patient will be discharged home with prescriptions for 5-day course of prednisone and Vistaril. Patient is to follow up with dermatology as needed or otherwise directed.  Patient is given ED precautions to return to the ED for any worsening or new symptoms.     ____________________________________________  FINAL CLINICAL IMPRESSION(S) / ED DIAGNOSES  Final diagnoses:  Hives      NEW MEDICATIONS STARTED DURING THIS VISIT:  ED Discharge Orders        Ordered    hydrOXYzine (VISTARIL) 50 MG capsule  3 times daily PRN     09/26/17 2225    predniSONE (DELTASONE) 50 MG tablet  Daily with breakfast     09/26/17 2225          This chart was dictated using voice recognition software/Dragon. Despite best efforts to proofread, errors can occur which can change the meaning. Any change was purely unintentional.    Racheal Patches, PA-C 09/26/17 2228    Minna Antis, MD 09/26/17 [phone]

## 2017-09-26 NOTE — ED Triage Notes (Signed)
Pt reports she noticed itching on her left thigh when she woke up this morning and this afternoon started having generalized rash and hives. She says that about 2 months ago she had similar episode and was seen by her PCP for the same.

## 2018-04-11 ENCOUNTER — Encounter: Payer: Self-pay | Admitting: Obstetrics and Gynecology

## 2018-04-11 ENCOUNTER — Other Ambulatory Visit (HOSPITAL_COMMUNITY)
Admission: RE | Admit: 2018-04-11 | Discharge: 2018-04-11 | Disposition: A | Discharge status: Home / Self Care | Payer: BLUE CROSS/BLUE SHIELD | Source: Ambulatory Visit | Attending: Obstetrics and Gynecology | Admitting: Obstetrics and Gynecology

## 2018-04-11 ENCOUNTER — Ambulatory Visit: Payer: BLUE CROSS/BLUE SHIELD | Admitting: Obstetrics and Gynecology

## 2018-04-11 ENCOUNTER — Ambulatory Visit (INDEPENDENT_AMBULATORY_CARE_PROVIDER_SITE_OTHER): Payer: BLUE CROSS/BLUE SHIELD | Admitting: Obstetrics and Gynecology

## 2018-04-11 VITALS — BP 130/80 | HR 98 | Ht 69.0 in | Wt 334.0 lb

## 2018-04-11 DIAGNOSIS — Z3009 Encounter for other general counseling and advice on contraception: Secondary | ICD-10-CM

## 2018-04-11 DIAGNOSIS — Z124 Encounter for screening for malignant neoplasm of cervix: Secondary | ICD-10-CM | POA: Diagnosis present

## 2018-04-11 DIAGNOSIS — Z0000 Encounter for general adult medical examination without abnormal findings: Secondary | ICD-10-CM

## 2018-04-11 MED ORDER — INTROVALE 0.15-0.03 MG PO TABS: 1.0000 | ORAL_TABLET | Freq: Every day | ORAL | 4 refills | Status: DC

## 2018-04-11 NOTE — Progress Notes (Signed)
Gynecology Annual Exam  [facility]  Chief Complaint:  Chief Complaint  Patient presents with  . Gynecologic Exam    Headache x 3 days     History of Present Illness: Patient is a 22 y.o. No obstetric history on file. presents for annual exam. The patient has no complaints today. Review of Systems: ROS  Past Medical History:  Past Medical History:  Diagnosis Date  . ADHD (attention deficit hyperactivity disorder)    no meds taken  . Anxiety   . Diabetes mellitus    pre-diabetic,told when younger  . Headache(784.0)    chronic migraines, decrease since BCP started  . Insomnia   . Irregular menstrual cycle    pt states she started BCP,unsure of name to help regulate cycle  . Obesity   . Vision abnormalities    near sighted,wears glasses    Past Surgical History:  Past Surgical History:  Procedure Laterality Date  . CHOLECYSTECTOMY    . TONSILLECTOMY     at 22yo    Gynecologic History:  No LMP recorded. (Menstrual status: Oral contraceptives).  Obstetric History: No obstetric history on file.  Family History:  Family History  Problem Relation Age of Onset  . Thyroid cancer Mother 17  . Leukemia Paternal Aunt   . Breast cancer Paternal Aunt 40  . Skin cancer Maternal Grandfather     Social History:  Social History   Socioeconomic History  . Marital status: Single    Spouse name: Not on file  . Number of children: Not on file  . Years of education: Not on file  . Highest education level: Not on file  Occupational History  . 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 Financial resource strain: Not on file  . Food insecurity:    Worry: Not on file    Inability: Not on file  . Transportation needs:    Medical: Not on file    Non-medical: Not on file  Tobacco Use  . Smoking status: Never Smoker  . Smokeless tobacco: Never Used  Substance and Sexual Activity  . Alcohol use: Yes    Comment:  occ  . Drug use: No  . Sexual activity: Not Currently    Partners: Male    Birth control/protection: Pill  Lifestyle  . Physical activity:    Days per week: 0 days    Minutes per session: 0 min  . Stress: To some extent  Relationships  . Social connections:    Talks on phone: Once a week    Gets together: More than three times a week    Attends religious service: Never    Active member of club or organization: Yes    Attends meetings of clubs or organizations: More than 4 times per year    Relationship status: Never married  . Intimate partner violence:    Fear of current or ex partner: No    Emotionally abused: No    Physically abused: No    Forced sexual activity: No  Other Topics Concern  . Not on file  Social History Narrative  . Not on file    Allergies:  Allergies  Allergen Reactions  . Lamisil [Terbinafine]     Medications: Prior to Admission medications   Medication Sig Start Date End Date Taking? Authorizing Provider  aspirin-acetaminophen-caffeine (EXCEDRIN MIGRAINE) [phone] MG per tablet Take 2 tablets by mouth every 6 (six) hours as needed.  For migraines.    Yes [provider]  cetirizine (ZYRTEC) 10 MG tablet TK 1 T PO BID 10/03/17  Yes [provider]  Cyanocobalamin (VITAMIN B-12) 2000 MCG TBCR Take by mouth. 07/26/17 07/26/18 Yes [provider]  desvenlafaxine (PRISTIQ) 50 MG 24 hr tablet Take by mouth.   Yes [provider]  diazepam (VALIUM) 5 MG tablet TK 1 TO 2 TS PO HS PRF INSOMNIA 12/08/17  Yes [provider]  hydrOXYzine (VISTARIL) 50 MG capsule Take 1 capsule (50 mg total) by mouth 3 (three) times daily as needed for itching. 09/26/17  Yes Cuthriell, Delorise Royals, PA-C  INTROVALE 0.15-0.03 MG tablet Take 1 tablet by mouth daily. 04/11/18  Yes Kaylinn Dedic, Jaquelyn Bitter, MD  lansoprazole (PREVACID) 30 MG capsule  04/01/17  Yes [provider]  meloxicam (MOBIC) 15 MG tablet TK 1 T PO ONCE A DAY 01/23/18   Yes [provider]  methylphenidate (RITALIN) 5 MG tablet Take by mouth.   Yes [provider]  pantoprazole (PROTONIX) 40 MG tablet  04/02/18  Yes [provider]  spironolactone (ALDACTONE) 100 MG tablet TK 1 T PO QD B NOON 04/02/18  Yes [provider]  tiZANidine (ZANAFLEX) 4 MG tablet  01/23/18  Yes [provider]  Vitamin D, Ergocalciferol, (DRISDOL) 1.25 MG (50000 UT) CAPS capsule Take by mouth. 11/17/17  Yes [provider]  zaleplon (SONATA) 10 MG capsule TK 2 CS PO QHS 03/29/17  Yes [provider]  lansoprazole (PREVACID) 30 MG capsule Take by mouth. 07/04/16 07/04/17  [provider]  predniSONE (DELTASONE) 50 MG tablet Take 1 tablet (50 mg total) by mouth daily with breakfast. Patient not taking: Reported on 04/11/2018 09/26/17   Cuthriell, Delorise Royals, PA-C    Physical Exam Vitals: Blood pressure 130/80, pulse 98, height 5\' 9"  (1.753 m), weight (!) 334 lb (151.5 kg).  General: NAD HEENT: normocephalic, anicteric Thyroid: no enlargement, no palpable nodules Pulmonary: No increased work of breathing, CTAB Cardiovascular: RRR, distal pulses 2+ Breast: Breast symmetrical, no tenderness, no palpable nodules or masses, no skin or nipple retraction present, no nipple discharge.  No axillary or supraclavicular lymphadenopathy. Abdomen: NABS, soft, non-tender, non-distended.  Umbilicus without lesions.  No hepatomegaly, splenomegaly or masses palpable. No evidence of hernia  Genitourinary:  External: Normal external female genitalia.  Normal urethral meatus, normal Bartholin's and Skene's glands.    Vagina: Normal vaginal mucosa, no evidence of prolapse.    Cervix: Grossly normal in appearance, no bleeding  Uterus: Non-enlarged, mobile, normal contour.  No CMT  Adnexa: ovaries non-enlarged, no adnexal masses  Rectal: deferred  Lymphatic: no evidence of inguinal lymphadenopathy Extremities: no edema, erythema, or  tenderness Neurologic: Grossly intact Psychiatric: mood appropriate, affect full  Female chaperone present for pelvic and breast  portions of the physical exam    Assessment: 22 y.o. No obstetric history on file. routine annual exam  Plan: Problem List Items Addressed This Visit    None    Visit Diagnoses    Encounter for annual physical exam    -  Primary   Relevant Medications   INTROVALE 0.15-0.03 MG tablet   Screening for cervical cancer       Relevant Orders   Cytology - PAP   Birth control counseling       Relevant Medications   INTROVALE 0.15-0.03 MG tablet     1) STI screening  wasoffered and declined  3)  ASCCP guidelines and rational discussed.  Patient  opts for every 3 years screening interval  4) Contraception - the patient is currently using  OCP (estrogen/progesterone).  She is happy with her current form of contraception and plans to continue We discussed safe sex practices to reduce her furture risk of STI's.    5) Return in about 1 year (around 04/12/2019) for annual.   6) Patient having headache- discussed neurology and posibility of referral Birth control refilled  Emotional support given .   Adelene Idlerhristanna Sharyn Brilliant MD [facility], [facility] 04/11/18 10:18 PM

## 2018-04-13 LAB — CYTOLOGY - PAP: DIAGNOSIS: NEGATIVE

## 2018-04-16 NOTE — Progress Notes (Signed)
Please call patient with normal result.

## 2018-08-28 DIAGNOSIS — J301 Allergic rhinitis due to pollen: Secondary | ICD-10-CM | POA: Insufficient documentation

## 2018-11-09 ENCOUNTER — Other Ambulatory Visit: Payer: Self-pay | Admitting: Neurology

## 2018-11-09 DIAGNOSIS — R519 Headache, unspecified: Secondary | ICD-10-CM

## 2018-11-20 ENCOUNTER — Encounter (HOSPITAL_COMMUNITY): Payer: Self-pay

## 2018-11-20 ENCOUNTER — Other Ambulatory Visit (HOSPITAL_COMMUNITY): Payer: BLUE CROSS/BLUE SHIELD

## 2018-11-20 ENCOUNTER — Ambulatory Visit (HOSPITAL_COMMUNITY): Payer: BLUE CROSS/BLUE SHIELD

## 2019-03-24 ENCOUNTER — Other Ambulatory Visit: Payer: Self-pay

## 2019-03-24 ENCOUNTER — Ambulatory Visit
Admission: EM | Admit: 2019-03-24 | Discharge: 2019-03-24 | Disposition: A | Discharge status: Home / Self Care | Payer: BC Managed Care – PPO | Attending: Emergency Medicine | Admitting: Emergency Medicine

## 2019-03-24 DIAGNOSIS — J019 Acute sinusitis, unspecified: Secondary | ICD-10-CM

## 2019-03-24 DIAGNOSIS — F419 Anxiety disorder, unspecified: Secondary | ICD-10-CM

## 2019-03-24 DIAGNOSIS — Z20828 Contact with and (suspected) exposure to other viral communicable diseases: Secondary | ICD-10-CM

## 2019-03-24 DIAGNOSIS — Z20822 Contact with and (suspected) exposure to covid-19: Secondary | ICD-10-CM

## 2019-03-24 MED ORDER — FLUTICASONE PROPIONATE 50 MCG/ACT NA SUSP: 2.0000 | Freq: Every day | NASAL | 2 refills | Status: DC

## 2019-03-24 MED ORDER — AMOXICILLIN 500 MG PO CAPS: 500.0000 mg | ORAL_CAPSULE | Freq: Three times a day (TID) | ORAL | 0 refills | Status: DC

## 2019-03-24 NOTE — ED Triage Notes (Signed)
Patient complains of possible allergic reaction. Patient states that she is concerned that her shortness of breath is from her pristiq. States that she has been on the medication for years but increased her medication x 2 months ago. Patient states that she is also having sinus drainage that she thinks is unrelated since it started this week. Patient states that she did a telehealth visit on Thursday and was told it was allergies but she said she thinks it a sinus infection and would like treatment. Unsure of any covid exposure so she would like to be swabbed.

## 2019-03-24 NOTE — ED Provider Notes (Signed)
[facility] [place]   Name: Martha [name] DOB: [birth year] MRN: [id number] CSN: [id number] [place]  Arrival date and time:  03/24/19 1205  Chief Complaint:  Shortness of Breath   NOTE: Prior to seeing the patient today, I have reviewed the triage nursing documentation and vital signs. Clinical staff has updated patient's PMH/PSHx, current medication list, and drug allergies/intolerances to ensure comprehensive history available to assist in medical decision making.   History:   HPI: Martha [name] is a 23 y.o. female who presents today with complaints of sinus pressure and shortness of breath. Her sinus pressure started on Tuesday and has not gotten better with oral or intranasal antihistaminic. Her congestion increases when laying down. She denies headaches and fever; associated symptoms include ear pressure and nasal drainage.   Her shortness of breath started last night. She attributes her shortness of breath to an increase of anxiety and depression. She has had multiple losses of life within her family in a short period of time.  She works at the [employer] and is requesting a COVID-19 test during this visit.  Past Medical History:  Diagnosis Date  . ADHD (attention deficit hyperactivity disorder)    no meds taken  . Anxiety   . Diabetes mellitus    pre-diabetic,told when younger  . Headache(784.0)    chronic migraines, decrease since BCP started  . Insomnia   . Irregular menstrual cycle    pt states she started BCP,unsure of name to help regulate cycle  . Obesity   . Vision abnormalities    near sighted,wears glasses    Past Surgical History:  Procedure Laterality Date  . CHOLECYSTECTOMY    . TONSILLECTOMY     at 23yo    Family History  Problem Relation Age of Onset  . Thyroid cancer Mother 7640  . Leukemia Paternal Aunt   . Breast cancer Paternal Aunt 40  . Skin cancer Maternal Grandfather     Social History    Tobacco Use  . Smoking status: Never Smoker  . Smokeless tobacco: Never Used  Substance Use Topics  . Alcohol use: Yes    Comment: occ  . Drug use: No    Patient Active Problem List   Diagnosis Date Noted  . Suicidal ideation 05/10/2011  . Depression 05/10/2011    Home Medications:    Current Meds  Medication Sig  . aspirin-acetaminophen-caffeine (EXCEDRIN MIGRAINE) 250-250-65 MG per tablet Take 2 tablets by mouth every 6 (six) hours as needed. For migraines.   . cetirizine (ZYRTEC) 10 MG tablet TK 1 T PO BID  . desvenlafaxine (PRISTIQ) 100 MG 24 hr tablet Take 100 mg by mouth.   . diazepam (VALIUM) 5 MG tablet TK 1 TO 2 TS PO HS PRF INSOMNIA  . INTROVALE 0.15-0.03 MG tablet Take 1 tablet by mouth daily.  . lansoprazole (PREVACID) 30 MG capsule   . meloxicam (MOBIC) 15 MG tablet TK 1 T PO ONCE A DAY  . methylphenidate (RITALIN) 5 MG tablet Take by mouth.  . spironolactone (ALDACTONE) 100 MG tablet TK 1 T PO QD B NOON  . tiZANidine (ZANAFLEX) 4 MG tablet   . Vitamin D, Ergocalciferol, (DRISDOL) 1.25 MG (50000 UT) CAPS capsule Take by mouth.  . zaleplon (SONATA) 10 MG capsule TK 2 CS PO QHS    Allergies:   Lamisil [terbinafine]  Review of Systems (ROS): Review of Systems  Constitutional: Negative for activity change, appetite change,  chills, diaphoresis, fatigue and fever.  HENT: Positive for congestion, ear pain, postnasal drip, sinus pressure, sinus pain and sore throat. Negative for ear discharge, facial swelling, hearing loss and sneezing.   Eyes: Negative for pain.  Respiratory: Positive for cough. Negative for shortness of breath and wheezing.   Gastrointestinal: Positive for nausea. Negative for constipation and vomiting.  Musculoskeletal: Negative for myalgias.  All other systems reviewed and are negative.    Vital Signs: Today's Vitals   03/24/19 1225 03/24/19 1232 03/24/19 1258  BP:  (!) 149/98   Pulse:  90   Resp:  16   Temp:  98.2 F (36.8 C)    TempSrc:  Oral   SpO2:  97%   Weight: (!) 350 lb (158.8 kg)    Height: 5\' 9"  (1.753 m)    PainSc: 0-No pain  0-No pain    Physical Exam: Physical Exam Vitals signs and nursing note reviewed.  HENT:     Head: Normocephalic.     Right Ear: No middle ear effusion.     Left Ear: Tympanic membrane normal.  No middle ear effusion.     Nose: Congestion present.     Right Sinus: No maxillary sinus tenderness or frontal sinus tenderness.     Left Sinus: Maxillary sinus tenderness and frontal sinus tenderness present.     Mouth/Throat:     Mouth: Mucous membranes are moist.     Pharynx: Posterior oropharyngeal erythema present.     Tonsils: No tonsillar exudate or tonsillar abscesses.  Neck:     Musculoskeletal: Normal range of motion.  Cardiovascular:     Rate and Rhythm: Normal rate and regular rhythm.     Heart sounds: Normal heart sounds.  Pulmonary:     Effort: Pulmonary effort is normal.     Breath sounds: Normal breath sounds.  Lymphadenopathy:     Cervical: No cervical adenopathy.  Skin:    General: Skin is warm and dry.  Neurological:     Mental Status: She is alert.      Urgent Care Treatments / Results:   LABS: PLEASE NOTE: all labs that were ordered this encounter are listed, however only abnormal results are displayed. Labs Reviewed  NOVEL CORONAVIRUS, NAA (HOSP ORDER, SEND-OUT TO REF LAB; TAT 18-24 HRS)    EKG: -None  RADIOLOGY: No results found.  PROCEDURES: Procedures  MEDICATIONS RECEIVED THIS VISIT: Medications - No data to display  PERTINENT CLINICAL COURSE NOTES/UPDATES:   Initial Impression / Assessment and Plan / Urgent Care Course:  Pertinent labs & imaging results that were available during my care of the patient were personally reviewed by me and considered in my medical decision making (see lab/imaging section of note for values and interpretations).  Martha [name] is a 23 y.o. female who presents to [facility] today with  complaints of sinus pressure and shortness of breath, diagnosed with sinusitis andanxiety, and treated as such with the medications below. NP and patient reviewed discharge instructions below during visit.   Patient is well appearing overall in clinic today. She does not appear to be in any acute distress. Presenting symptoms (see HPI) and exam as documented above.   I have reviewed the follow up and strict return precautions for any new or worsening symptoms. Patient is aware of symptoms that would be deemed urgent/emergent, and would thus require further evaluation either here or in the emergency department. At the time of discharge, she verbalized understanding and consent with the discharge plan  as it was reviewed with her. All questions were fielded by provider and/or clinic staff prior to patient discharge.    Final Clinical Impressions / Urgent Care Diagnoses:   Final diagnoses:  Acute non-recurrent sinusitis, unspecified location  Anxiety  Encounter for screening laboratory testing for COVID-19 virus in asymptomatic patient    New Prescriptions:  [place] Controlled Substance Registry consulted? Not Applicable  Meds ordered this encounter  Medications  . amoxicillin (AMOXIL) 500 MG capsule    Sig: Take 1 capsule (500 mg total) by mouth 3 (three) times daily.    Dispense:  30 capsule    Refill:  0  . fluticasone (FLONASE) 50 MCG/ACT nasal spray    Sig: Place 2 sprays into both nostrils daily.    Dispense:  18.2 mL    Refill:  2      Discharge Instructions     You are being treated for a sinus infection.  Take the antibiotics as prescribed until they're finished. If you think you're having a reaction, stop the medication, take benadryl and go to the nearest urgent care/emergency room. Take a probiotic while taking the antibiotic to decrease the chances of stomach upset.   Follow-up with your therapist regarding your increased anxiety/depression.   You were tested for COVID-19  during this visit. You should restrict activities outside your home, except for getting medical care. Do not go to work, school, or public areas, and do not use public transportation or taxis until you are notified of a negative test.       Recommended Follow up Care:  Patient encouraged to follow up with the following provider within the specified time frame, or sooner as dictated by the severity of her symptoms. As always, she was instructed that for any urgent/emergent care needs, she should seek care either here or in the emergency department for more immediate evaluation.   Gertie Baron, DNP, NP-c   Gertie Baron, NP 03/24/19 [phone]

## 2019-03-24 NOTE — Discharge Instructions (Signed)
You are being treated for a sinus infection.  Take the antibiotics as prescribed until they're finished. If you think you're having a reaction, stop the medication, take benadryl and go to the nearest urgent care/emergency room. Take a probiotic while taking the antibiotic to decrease the chances of stomach upset.   Follow-up with your therapist regarding your increased anxiety/depression.   You were tested for COVID-19 during this visit. You should restrict activities outside your home, except for getting medical care. Do not go to work, school, or public areas, and do not use public transportation or taxis until you are notified of a negative test.

## 2019-03-26 LAB — NOVEL CORONAVIRUS, NAA (HOSP ORDER, SEND-OUT TO REF LAB; TAT 18-24 HRS): SARS-CoV-2, NAA: NOT DETECTED

## 2019-04-15 ENCOUNTER — Encounter: Payer: Self-pay | Admitting: Obstetrics and Gynecology

## 2019-04-15 ENCOUNTER — Ambulatory Visit (INDEPENDENT_AMBULATORY_CARE_PROVIDER_SITE_OTHER): Payer: BC Managed Care – PPO | Admitting: Obstetrics and Gynecology

## 2019-04-15 ENCOUNTER — Other Ambulatory Visit: Payer: Self-pay

## 2019-04-15 DIAGNOSIS — Z3009 Encounter for other general counseling and advice on contraception: Secondary | ICD-10-CM

## 2019-04-15 DIAGNOSIS — Z01419 Encounter for gynecological examination (general) (routine) without abnormal findings: Secondary | ICD-10-CM

## 2019-04-15 DIAGNOSIS — Z0000 Encounter for general adult medical examination without abnormal findings: Secondary | ICD-10-CM

## 2019-04-15 MED ORDER — INTROVALE 0.15-0.03 MG PO TABS: 1.0000 | ORAL_TABLET | Freq: Every day | ORAL | 4 refills | Status: DC

## 2019-04-15 NOTE — Progress Notes (Signed)
Gynecology Annual Exam  PCP: [facility]  Chief Complaint:  Chief Complaint  Patient presents with  . Gynecologic Exam    History of Present Illness: Patient is a 23 y.o. G0P0000 presents for annual exam. The patient has no complaints today.   LMP: Patient's last menstrual period was 01/14/2019 (approximate). Menarche:not applicable Average Interval: regular, 90 days Duration of flow: 5 days Heavy Menses: no Clots: no Intermenstrual Bleeding: no Postcoital Bleeding: no Dysmenorrhea: no  The patient is not sexually active. She currently uses OCP (estrogen/progesterone) for contraception. She denies dyspareunia.  The patient does perform self breast exams.  There is notable family history of breast or ovarian cancer in her family.  The patient wears seatbelts: no.  The patient has regular exercise: yes.    The patient denies current symptoms of depression.    Review of Systems: Review of Systems  Constitutional: Negative for chills, fever, malaise/fatigue and weight loss.  HENT: Negative for congestion, hearing loss and sinus pain.   Eyes: Negative for blurred vision and double vision.  Respiratory: Negative for cough, sputum production, shortness of breath and wheezing.   Cardiovascular: Negative for chest pain, palpitations, orthopnea and leg swelling.  Gastrointestinal: Negative for abdominal pain, constipation, diarrhea, nausea and vomiting.  Genitourinary: Negative for dysuria, flank pain, frequency, hematuria and urgency.  Musculoskeletal: Negative for back pain, falls and joint pain.  Skin: Negative for itching and rash.  Neurological: Negative for dizziness and headaches.  Psychiatric/Behavioral: Negative for depression, substance abuse and suicidal ideas. The patient is not nervous/anxious.     Past Medical History:  Past Medical History:  Diagnosis Date  . ADHD (attention deficit hyperactivity disorder)    no meds taken  . Anxiety   .  Diabetes mellitus    pre-diabetic,told when younger  . Headache(784.0)    chronic migraines, decrease since BCP started  . Idiopathic intracranial hypertension   . Insomnia   . Irregular menstrual cycle    pt states she started BCP,unsure of name to help regulate cycle  . Obesity   . Vision abnormalities    near sighted,wears glasses    Past Surgical History:  Past Surgical History:  Procedure Laterality Date  . CHOLECYSTECTOMY    . TONSILLECTOMY     at [place] History:  Patient's last menstrual period was 01/14/2019 (approximate). Contraception: OCP (estrogen/progesterone) Last Pap: Results were: NIL 2019  Obstetric History: G0P0000  Family History:  Family History  Problem Relation Age of Onset  . Thyroid cancer Mother 50  . Leukemia Paternal Aunt   . Breast cancer Paternal Aunt 107  . Skin cancer Maternal Grandfather     Social History:  Social History   Socioeconomic History  . Marital status: Single    Spouse name: Not on file  . Number of children: Not on file  . Years of education: Not on file  . Highest education level: Not on file  Occupational History  . Occupation: Ship broker    Comment: [employer] 12th grade  Social Needs  . Financial resource strain: Not on file  . Food insecurity    Worry: Not on file    Inability: Not on file  . Transportation needs    Medical: Not on file    Non-medical: Not on file  Tobacco Use  . Smoking status: Never Smoker  . Smokeless tobacco: Never Used  Substance and Sexual Activity  . Alcohol use: Yes    Comment: occ  .  Drug use: No  . Sexual activity: Not Currently    Partners: Male    Birth control/protection: Pill  Lifestyle  . Physical activity    Days per week: 0 days    Minutes per session: 0 min  . Stress: To some extent  Relationships  . Social Musicianconnections    Talks on phone: Once a week    Gets together: More than three times a week    Attends religious service: Never    Active  member of club or organization: Yes    Attends meetings of clubs or organizations: More than 4 times per year    Relationship status: Never married  . Intimate partner violence    Fear of current or ex partner: No    Emotionally abused: No    Physically abused: No    Forced sexual activity: No  Other Topics Concern  . Not on file  Social History Narrative  . Not on file    Allergies:  Allergies  Allergen Reactions  . Lamisil [Terbinafine]     Medications: Prior to Admission medications   Medication Sig Start Date End Date Taking? Authorizing Provider  aspirin-acetaminophen-caffeine (EXCEDRIN MIGRAINE) [phone] MG per tablet Take 2 tablets by mouth every 6 (six) hours as needed. For migraines.    Yes [provider]  busPIRone (BUSPAR) 10 MG tablet Take 10 mg by mouth 2 (two) times daily. 04/08/19  Yes [provider]  cetirizine (ZYRTEC) 10 MG tablet TK 1 T PO BID 10/03/17  Yes [provider]  Cholecalciferol (VITAMIN D-1000 MAX ST) 25 MCG (1000 UT) tablet Take by mouth.   Yes [provider]  desvenlafaxine (PRISTIQ) 100 MG 24 hr tablet Take 100 mg by mouth.    Yes [provider]  diazepam (VALIUM) 5 MG tablet TK 1 TO 2 TS PO HS PRF INSOMNIA 12/08/17  Yes [provider]  INTROVALE 0.15-0.03 MG tablet Take 1 tablet by mouth daily. 04/11/18  Yes Marcellus Pulliam, Jaquelyn Bitterhristanna R, MD  lansoprazole (PREVACID) 30 MG capsule  04/01/17  Yes [provider]  meloxicam (MOBIC) 15 MG tablet TK 1 T PO ONCE A DAY 01/23/18  Yes [provider]  methylphenidate (RITALIN) 10 MG tablet Take 10 mg by mouth daily. 04/02/19  Yes [provider]  Multiple Vitamin (MULTI-VITAMIN) tablet Take by mouth.   Yes [provider]  pantoprazole (PROTONIX) 40 MG tablet  04/02/18  Yes [provider]  predniSONE (DELTASONE) 50 MG tablet Take 1 tablet (50 mg total) by mouth daily with breakfast. 09/26/17  Yes Cuthriell, Delorise RoyalsJonathan  D, PA-C  spironolactone (ALDACTONE) 100 MG tablet TK 1 T PO QD B NOON 04/02/18  Yes [provider]  tiZANidine (ZANAFLEX) 4 MG tablet  01/23/18  Yes [provider]  topiramate (TOPAMAX) 50 MG tablet 50 mg in the morning and 100 mg at night. 12/13/18  Yes [provider]  vitamin B-12 (CYANOCOBALAMIN) 1000 MCG tablet Take by mouth.   Yes [provider]  zaleplon (SONATA) 10 MG capsule TK 2 CS PO QHS 03/29/17  Yes [provider]    Physical Exam Vitals: Blood pressure 130/80, height 5\' 8"  (1.727 m), weight (!) 343 lb (155.6 kg), last menstrual period 01/14/2019.  General: NAD HEENT: normocephalic, anicteric Thyroid: no enlargement, no palpable nodules Pulmonary: No increased work of breathing, CTAB Cardiovascular: RRR, distal pulses 2+ Breast: Breast symmetrical, no tenderness, no palpable nodules or masses, no skin or nipple retraction present, no nipple discharge.  No axillary or supraclavicular lymphadenopathy. Abdomen: NABS, soft, non-tender, non-distended.  Umbilicus without lesions.  No hepatomegaly, splenomegaly or masses palpable. No evidence of hernia  Genitourinary:  External: Normal external female genitalia.  Normal urethral meatus, normal Bartholin's and Skene's glands.    Vagina: Normal vaginal mucosa, no evidence of prolapse.    Cervix: Grossly normal in appearance, no bleeding  Uterus: Non-enlarged, mobile, normal contour.  No CMT  Adnexa: ovaries non-enlarged, no adnexal masses  Rectal: deferred  Lymphatic: no evidence of inguinal lymphadenopathy Extremities: no edema, erythema, or tenderness Neurologic: Grossly intact Psychiatric: mood appropriate, affect full  Female chaperone present for pelvic and breast  portions of the physical exam    Assessment: 23 y.o. G0P0000 routine annual exam  Plan: Problem List Items Addressed This Visit    None    Visit Diagnoses    Encounter for annual physical exam       Relevant  Medications   INTROVALE 0.15-0.03 MG tablet   Birth control counseling       Relevant Medications   INTROVALE 0.15-0.03 MG tablet      1)  STI screening  was offered and declined  2)  ASCCP guidelines and rational discussed.  Patient opts for every 3 years screening interval  3) Contraception - the patient is currently using  OCP (estrogen/progesterone).  She is happy with her current form of contraception and plans to continue We discussed safe sex practices to reduce her furture risk of STI's.    Refill sent for birth control  4) Discussed depression and anxiety- she is following up with her PCP actively for this issue every month and it is managed by them. Stable.   5) Return in about 1 year (around 04/14/2020) for annual.   Adelene Idler MD [facility], [facility] 04/15/2019 11:33 AM

## 2019-04-15 NOTE — Patient Instructions (Signed)
Institute of Medicine Recommended Dietary Allowances for Calcium and Vitamin D  Age (yr) Calcium Recommended Dietary Allowance (mg/day) Vitamin D Recommended Dietary Allowance (international units/day)  9-18 1,300 600  19-50 1,000 600  51-70 1,200 600  71 and older 1,200 800  Data from Institute of Medicine. Dietary reference intakes: calcium, vitamin D. [place], [place]: National Academies Press; 2011.    

## 2019-05-15 ENCOUNTER — Encounter: Payer: Self-pay | Admitting: Obstetrics and Gynecology

## 2019-05-15 NOTE — Telephone Encounter (Signed)
This encounter was created in error - please disregard.

## 2019-07-10 ENCOUNTER — Ambulatory Visit: Admit: 2019-07-10 | Payer: BC Managed Care – PPO | Admitting: Internal Medicine

## 2019-07-10 SURGERY — ESOPHAGOGASTRODUODENOSCOPY (EGD) WITH PROPOFOL
Anesthesia: General

## 2019-08-01 IMAGING — US US ABDOMEN LIMITED
1 series · 14 of 15 positions shown · non-contrast
Comparison: None

CLINICAL DATA: Superficial lump in abdominal wall of RIGHT abdomen

EXAM:
LIMITED ULTRASOUND OF ABDOMINAL SOFT TISSUES
TECHNIQUE: Ultrasound examination of the abdominal wall soft tissues was
performed in the area of clinical concern.

[Series 1: us abdomen limited · 0.07mm/px · 15 acquisitions, 14 frames shown]
[im 1/15]
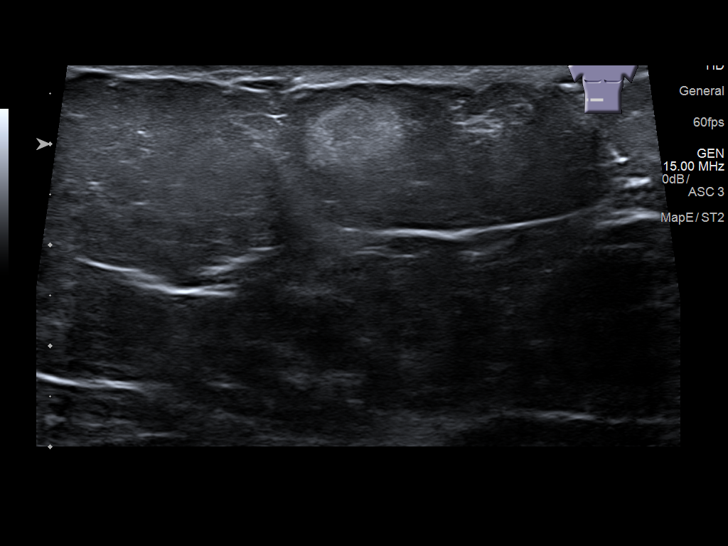
[im 2/15]
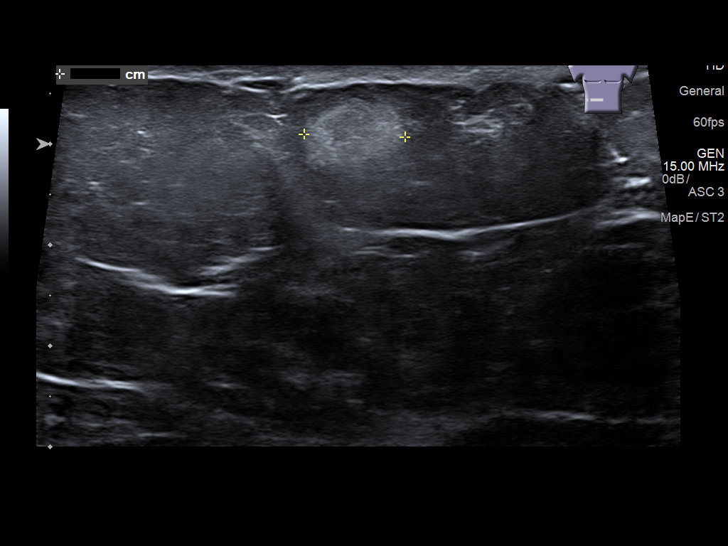
[im 3/15]
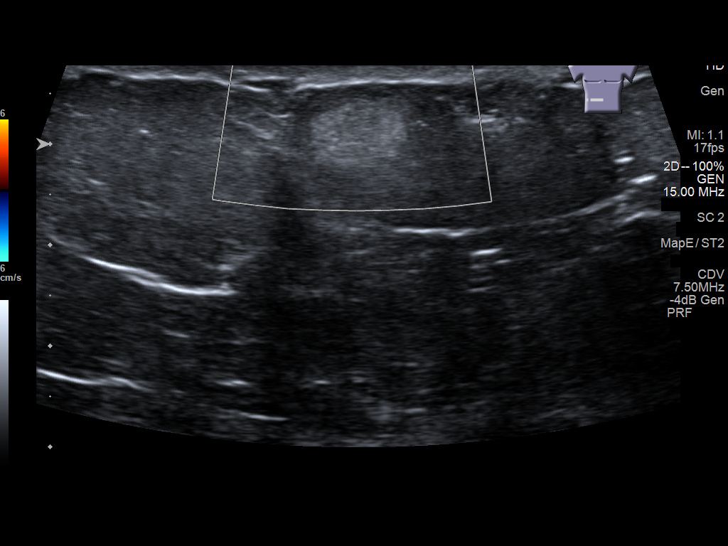
[im 4/15]
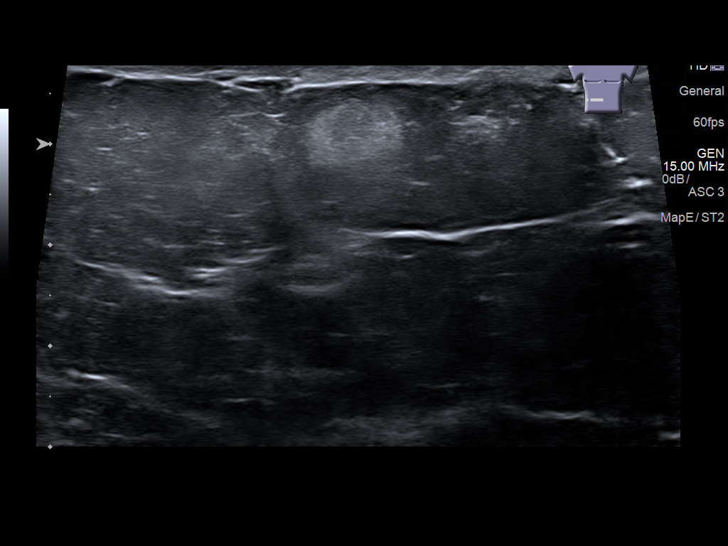
[im 5/15]
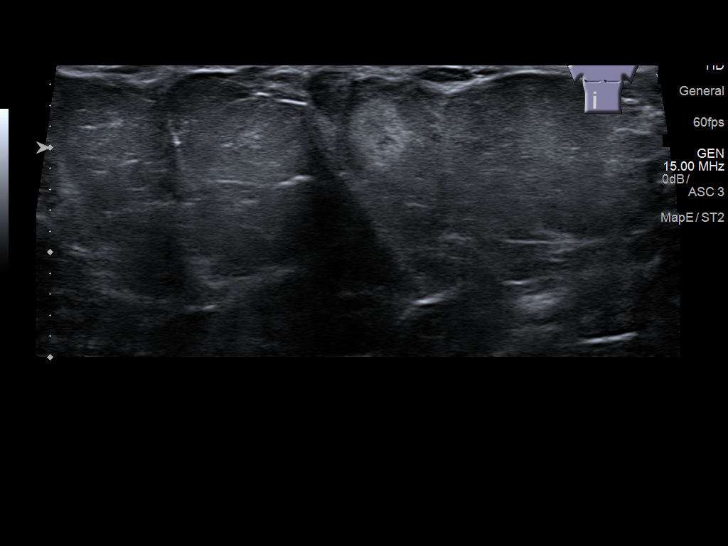
[im 6/15]
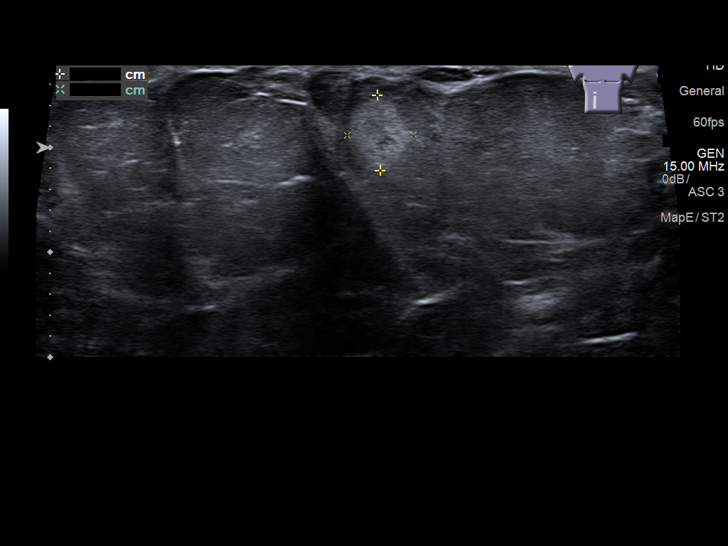
[im 7/15]
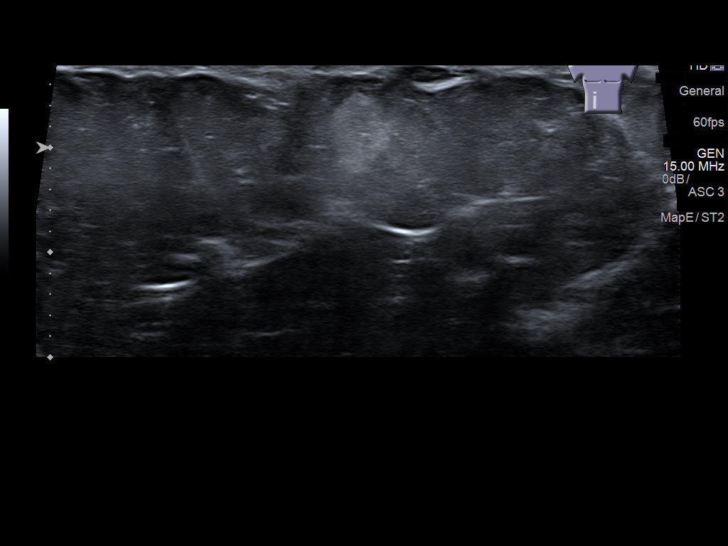
[im 9/15]
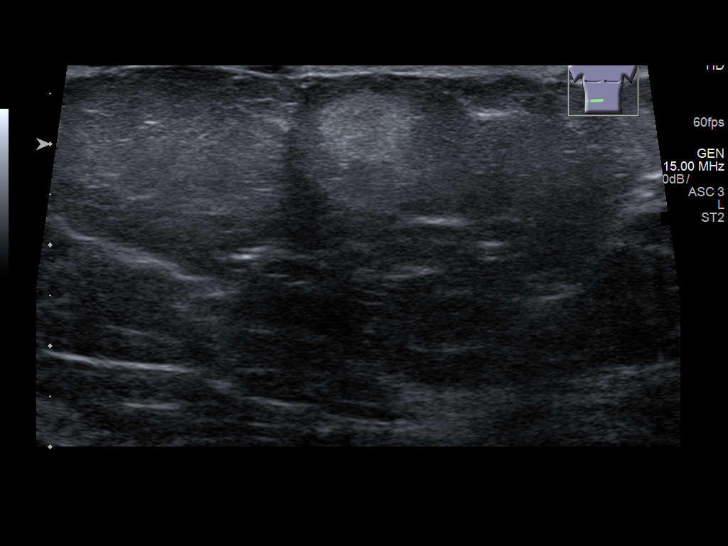
[im 10/15]
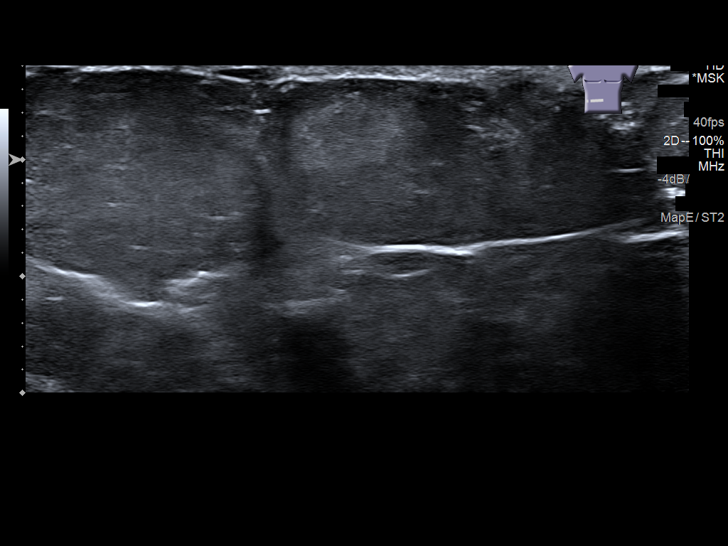
[im 11/15]
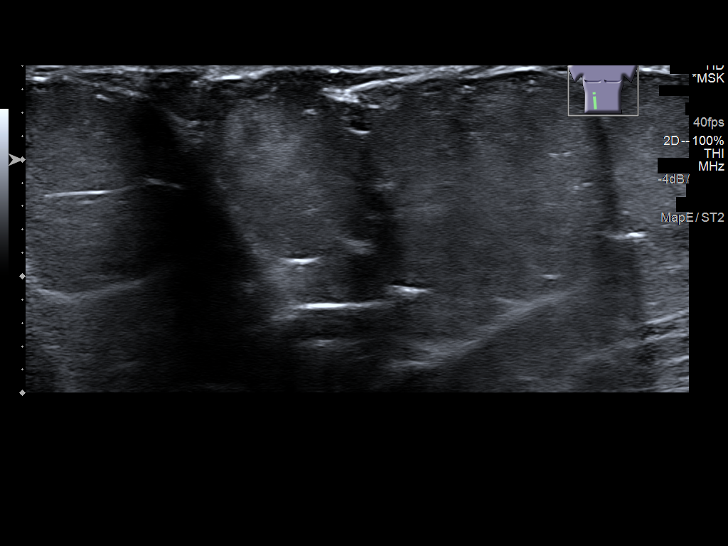
[im 12/15]
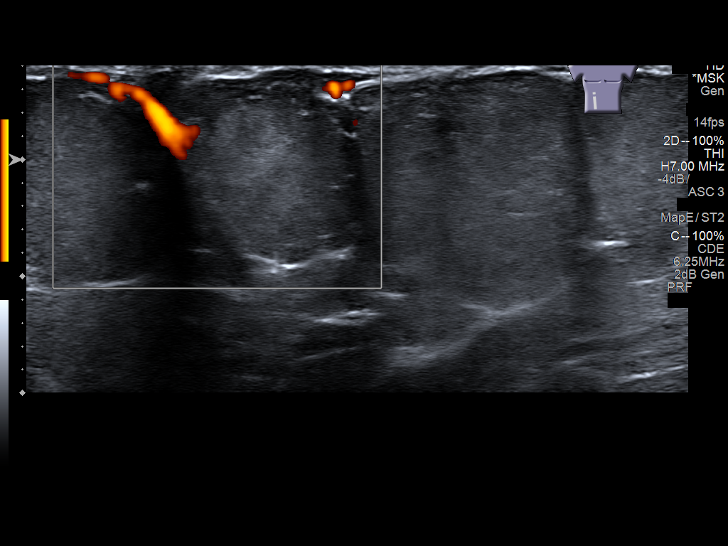
[im 13/15]
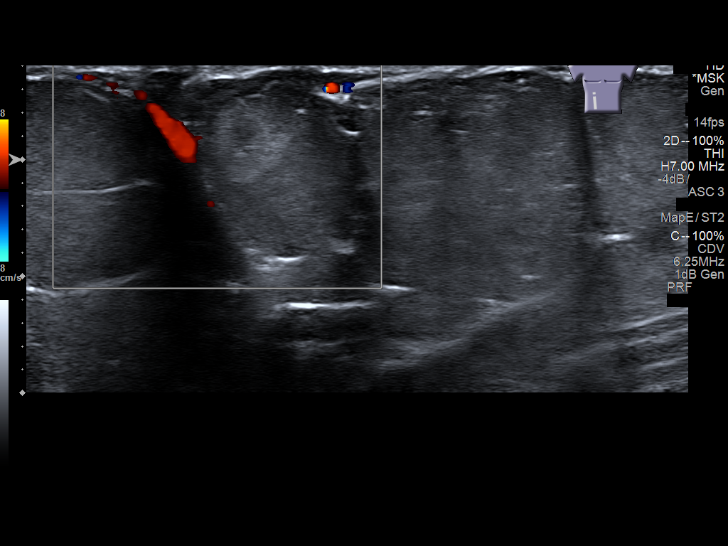
[im 14/15]
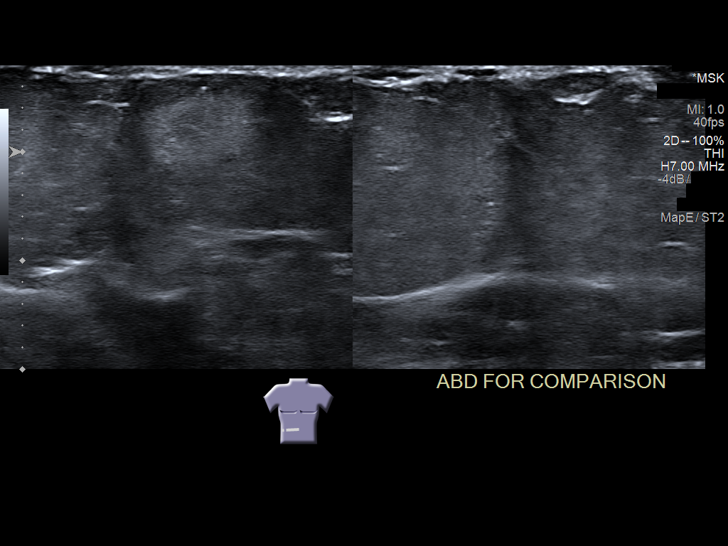
[im 15/15]
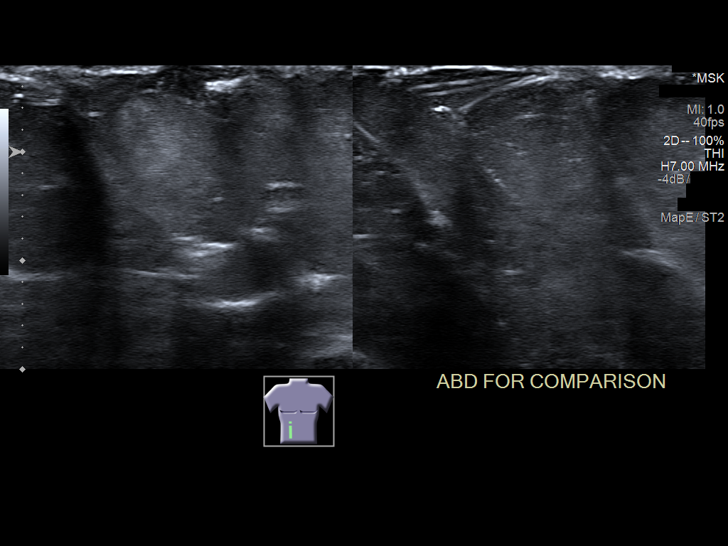

[14 of 15 positions shown; findings below may reference images not displayed]

FINDINGS: At the site of clinical concern, an area of hyperechogenicity seen
within the RIGHT rectus abdominus muscle, 10 x 6 x 7 mm in size.
Sonographic appearance is nonspecific. No hypervascularity on color
Doppler imaging. No calcifications or fluid component.
IMPRESSION: Nonspecific 10 mm hyperechoic focus within the rectus abdominis
muscle in the anterior RIGHT abdominal wall, corresponding to site
of palpable concern.

Sonographic appearance is nonspecific and this could represent a
small intramuscular lipoma, site of muscular injury, hemorrhage, or
fibrosis though a discrete soft tissue mass is not entirely
excluded.

## 2019-08-21 ENCOUNTER — Other Ambulatory Visit: Payer: Self-pay

## 2019-08-21 ENCOUNTER — Other Ambulatory Visit
Admission: RE | Admit: 2019-08-21 | Discharge: 2019-08-21 | Disposition: A | Discharge status: Home / Self Care | Payer: BC Managed Care – PPO | Source: Ambulatory Visit | Attending: Neurology | Admitting: Neurology

## 2019-08-21 DIAGNOSIS — Z01812 Encounter for preprocedural laboratory examination: Secondary | ICD-10-CM | POA: Diagnosis present

## 2019-08-21 DIAGNOSIS — Z20822 Contact with and (suspected) exposure to covid-19: Secondary | ICD-10-CM | POA: Insufficient documentation

## 2019-08-21 LAB — SARS CORONAVIRUS 2 (TAT 6-24 HRS): SARS Coronavirus 2: NEGATIVE

## 2019-08-23 ENCOUNTER — Ambulatory Visit: Payer: BC Managed Care – PPO | Attending: Neurology

## 2019-08-23 DIAGNOSIS — R0683 Snoring: Secondary | ICD-10-CM | POA: Diagnosis present

## 2019-08-23 DIAGNOSIS — G4733 Obstructive sleep apnea (adult) (pediatric): Secondary | ICD-10-CM | POA: Insufficient documentation

## 2019-08-26 ENCOUNTER — Other Ambulatory Visit: Payer: Self-pay

## 2019-10-13 ENCOUNTER — Ambulatory Visit
Admission: EM | Admit: 2019-10-13 | Discharge: 2019-10-13 | Disposition: A | Discharge status: Home / Self Care | Payer: BC Managed Care – PPO | Attending: Family Medicine | Admitting: Family Medicine

## 2019-10-13 ENCOUNTER — Encounter: Payer: Self-pay | Admitting: Emergency Medicine

## 2019-10-13 ENCOUNTER — Other Ambulatory Visit: Payer: Self-pay

## 2019-10-13 DIAGNOSIS — H6501 Acute serous otitis media, right ear: Secondary | ICD-10-CM | POA: Diagnosis not present

## 2019-10-13 MED ORDER — AMOXICILLIN 875 MG PO TABS: 875.0000 mg | ORAL_TABLET | Freq: Two times a day (BID) | ORAL | 0 refills | Status: DC

## 2019-10-13 NOTE — ED Triage Notes (Signed)
Patient c/o right ear pain that started yesterday.  Patient has not checked her temperature.

## 2019-10-13 NOTE — ED Provider Notes (Signed)
[facility]    CSN: [id number] Arrival date & time: 10/13/19  1317      History   Chief Complaint Chief Complaint  Patient presents with  . Otalgia    right    HPI Martha [name] is a 24 y.o. female.   24 yo female with a c/o right ear pain since yesterday. Denies any drainage, fevers, chills, congestion. States she takes medication for seasonal allergies.    Otalgia   Past Medical History:  Diagnosis Date  . ADHD (attention deficit hyperactivity disorder)    no meds taken  . Anxiety   . Diabetes mellitus    pre-diabetic,told when younger  . Family history of breast cancer    12/20 cancer genetic testing letter sent  . Headache(784.0)    chronic migraines, decrease since BCP started  . Idiopathic intracranial hypertension   . Insomnia   . Irregular menstrual cycle    pt states she started BCP,unsure of name to help regulate cycle  . Obesity   . Vision abnormalities    near sighted,wears glasses    Patient Active Problem List   Diagnosis Date Noted  . Suicidal ideation 05/10/2011  . Depression 05/10/2011    Past Surgical History:  Procedure Laterality Date  . CHOLECYSTECTOMY    . TONSILLECTOMY     at 24yo    OB History    Gravida  0   Para  0   Term  0   Preterm  0   AB  0   Living  0     SAB  0   TAB  0   Ectopic  0   Multiple  0   Live Births  0            Home Medications    Prior to Admission medications   Medication Sig Start Date End Date Taking? Authorizing Provider  busPIRone (BUSPAR) 10 MG tablet Take 15 mg by mouth 2 (two) times daily.  04/08/19  Yes [provider]  cetirizine (ZYRTEC) 10 MG tablet TK 1 T PO BID 10/03/17  Yes [provider]  Cholecalciferol (VITAMIN D-1000 MAX ST) 25 MCG (1000 UT) tablet Take by mouth.   Yes [provider]  desvenlafaxine (PRISTIQ) 100 MG 24 hr tablet Take 100 mg by mouth.    Yes [provider]  INTROVALE 0.15-0.03 MG tablet Take 1  tablet by mouth daily. 04/15/19  Yes Schuman, Jaquelyn Bitter, MD  lansoprazole (PREVACID) 30 MG capsule  04/01/17  Yes [provider]  methylphenidate (RITALIN) 10 MG tablet Take 10 mg by mouth daily. 04/02/19  Yes [provider]  Multiple Vitamin (MULTI-VITAMIN) tablet Take by mouth.   Yes [provider]  topiramate (TOPAMAX) 50 MG tablet 50 mg in the morning and 100 mg at night. 12/13/18  Yes [provider]  vitamin B-12 (CYANOCOBALAMIN) 1000 MCG tablet Take by mouth.   Yes [provider]  zaleplon (SONATA) 10 MG capsule TK 2 CS PO QHS 03/29/17  Yes [provider]  amoxicillin (AMOXIL) 875 MG tablet Take 1 tablet (875 mg total) by mouth 2 (two) times daily. 10/13/19   Payton Mccallum, MD  aspirin-acetaminophen-caffeine (EXCEDRIN MIGRAINE) [phone] MG per tablet Take 2 tablets by mouth every 6 (six) hours as needed. For migraines.     [provider]  diazepam (VALIUM) 5 MG tablet TK 1 TO 2 TS PO HS PRF INSOMNIA 12/08/17   [provider]  EMGALITY 120  MG/ML SOAJ SMARTSIG:1 Milliliter(s) SUB-Q Every 4 Weeks 09/16/19   [provider]  meloxicam (MOBIC) 15 MG tablet TK 1 T PO ONCE A DAY 01/23/18   [provider]  pantoprazole (PROTONIX) 40 MG tablet  04/02/18   [provider]  predniSONE (DELTASONE) 50 MG tablet Take 1 tablet (50 mg total) by mouth daily with breakfast. 09/26/17   Cuthriell, Delorise Royals, PA-C  spironolactone (ALDACTONE) 100 MG tablet TK 1 T PO QD B NOON 04/02/18   [provider]  tiZANidine (ZANAFLEX) 4 MG tablet  01/23/18   [provider]    Family History Family History  Problem Relation Age of Onset  . Thyroid cancer Mother 62  . Leukemia Paternal Aunt   . Breast cancer Paternal Aunt 40  . Skin cancer Maternal Grandfather     Social History Social History   Tobacco Use  . Smoking status: Never Smoker  . Smokeless tobacco: Never Used  Substance Use  Topics  . Alcohol use: Yes    Comment: occ  . Drug use: No     Allergies   Lamisil [terbinafine]   Review of Systems Review of Systems  HENT: Positive for ear pain.      Physical Exam Triage Vital Signs ED Triage Vitals  Enc Vitals Group     BP 10/13/19 1336 (!) 155/96     Pulse Rate 10/13/19 1336 78     Resp 10/13/19 1336 16     Temp 10/13/19 1336 98.1 F (36.7 C)     Temp Source 10/13/19 1336 Oral     SpO2 10/13/19 1336 98 %     Weight 10/13/19 1331 (!) 320 lb (145.2 kg)     Height 10/13/19 1331 5\' 8"  (1.727 m)     Head Circumference --      Peak Flow --      Pain Score 10/13/19 1331 3     Pain Loc --      Pain Edu? --      Excl. in GC? --    No data found.  Updated Vital Signs BP (!) 155/96 (BP Location: Left Arm)   Pulse 78   Temp 98.1 F (36.7 C) (Oral)   Resp 16   Ht 5\' 8"  (1.727 m)   Wt (!) 145.2 kg   SpO2 98%   BMI 48.66 kg/m   Visual Acuity Right Eye Distance:   Left Eye Distance:   Bilateral Distance:    Right Eye Near:   Left Eye Near:    Bilateral Near:     Physical Exam Vitals and nursing note reviewed.  Constitutional:      General: She is not in acute distress.    Appearance: She is not toxic-appearing or diaphoretic.  HENT:     Right Ear: Tympanic membrane is erythematous and bulging.     Left Ear: Tympanic membrane and ear canal normal.  Neurological:     Mental Status: She is alert.      UC Treatments / Results  Labs (all labs ordered are listed, but only abnormal results are displayed) Labs Reviewed - No data to display  EKG   Radiology No results found.  Procedures Procedures (including critical care time)  Medications Ordered in UC Medications - No data to display  Initial Impression / Assessment and Plan / UC Course  I have reviewed the triage vital signs and the nursing notes.  Pertinent labs & imaging results that were available during my care of  the patient were reviewed by me and considered in my  medical decision making (see chart for details).      Final Clinical Impressions(s) / UC Diagnoses   Final diagnoses:  Right acute serous otitis media, recurrence not specified    ED Prescriptions    Medication Sig Dispense Auth. Provider   amoxicillin (AMOXIL) 875 MG tablet Take 1 tablet (875 mg total) by mouth 2 (two) times daily. 20 tablet Norval Gable, MD      1. diagnosis reviewed with patient 2. rx as per orders above; reviewed possible side effects, interactions, risks and benefits  3. Recommend supportive treatment with otc analgesics prn 4. Follow-up prn if symptoms worsen or don't improve   PDMP not reviewed this encounter.   Norval Gable, MD 10/13/19 1504

## 2020-04-16 ENCOUNTER — Encounter: Payer: Self-pay | Admitting: Obstetrics and Gynecology

## 2020-04-16 ENCOUNTER — Ambulatory Visit (INDEPENDENT_AMBULATORY_CARE_PROVIDER_SITE_OTHER): Payer: 59 | Admitting: Obstetrics and Gynecology

## 2020-04-16 ENCOUNTER — Other Ambulatory Visit: Payer: Self-pay

## 2020-04-16 VITALS — BP 136/74 | Ht 68.0 in | Wt 352.4 lb

## 2020-04-16 DIAGNOSIS — Z01419 Encounter for gynecological examination (general) (routine) without abnormal findings: Secondary | ICD-10-CM

## 2020-04-16 DIAGNOSIS — Z131 Encounter for screening for diabetes mellitus: Secondary | ICD-10-CM

## 2020-04-16 DIAGNOSIS — Z0000 Encounter for general adult medical examination without abnormal findings: Secondary | ICD-10-CM

## 2020-04-16 DIAGNOSIS — Z130 Encounter for screening for diseases of the blood and blood-forming organs and certain disorders involving the immune mechanism: Secondary | ICD-10-CM

## 2020-04-16 DIAGNOSIS — Z1329 Encounter for screening for other suspected endocrine disorder: Secondary | ICD-10-CM | POA: Diagnosis not present

## 2020-04-16 DIAGNOSIS — Z1239 Encounter for other screening for malignant neoplasm of breast: Secondary | ICD-10-CM

## 2020-04-16 DIAGNOSIS — Z23 Encounter for immunization: Secondary | ICD-10-CM | POA: Diagnosis not present

## 2020-04-16 DIAGNOSIS — Z3041 Encounter for surveillance of contraceptive pills: Secondary | ICD-10-CM

## 2020-04-16 DIAGNOSIS — Z13220 Encounter for screening for lipoid disorders: Secondary | ICD-10-CM

## 2020-04-16 MED ORDER — LEVONORGEST-ETH ESTRAD 91-DAY 0.15-0.03 MG PO TABS: 1.0000 | ORAL_TABLET | Freq: Every day | ORAL | 4 refills | Status: DC

## 2020-04-16 NOTE — Progress Notes (Signed)
Gynecology Annual Exam  PCP: [facility]  Chief Complaint:  Chief Complaint  Patient presents with  . Gynecologic Exam    annual exam    History of Present Illness: Patient is a 24 y.o. G0P0000 presents for annual exam. The patient has no complaints today.   LMP: Patient's last menstrual period was 04/06/2020. Average Interval: regular, every 90 days Duration of flow: 3 days Heavy Menses: no Clots: no Intermenstrual Bleeding: no Postcoital Bleeding: not applicable Dysmenorrhea: no   The patient is not currently sexually active. She currently uses OCP (estrogen/progesterone) for contraception. She denies dyspareunia.  The patient does perform self breast exams.  There is notable family history of breast or ovarian cancer in her family.  The patient wears seatbelts: no.   The patient has regular exercise: no.    The patient denies current symptoms of depression.    PHQ-9: 9 GAD-7: 7  Review of Systems: Review of Systems  Constitutional: Negative for chills, fever, malaise/fatigue and weight loss.  HENT: Negative for congestion, hearing loss and sinus pain.   Eyes: Negative for blurred vision and double vision.  Respiratory: Negative for cough, sputum production, shortness of breath and wheezing.   Cardiovascular: Negative for chest pain, palpitations, orthopnea and leg swelling.  Gastrointestinal: Negative for abdominal pain, constipation, diarrhea, nausea and vomiting.  Genitourinary: Negative for dysuria, flank pain, frequency, hematuria and urgency.  Musculoskeletal: Negative for back pain, falls and joint pain.  Skin: Negative for itching and rash.  Neurological: Negative for dizziness and headaches.  Psychiatric/Behavioral: Negative for depression, substance abuse and suicidal ideas. The patient is not nervous/anxious.     Past Medical History:  Patient Active Problem List   Diagnosis Date Noted  . Suicidal ideation 05/10/2011  . Depression  05/10/2011    Past Surgical History:  Past Surgical History:  Procedure Laterality Date  . CHOLECYSTECTOMY    . TONSILLECTOMY     at 24yo    Gynecologic History:  Patient's last menstrual period was 04/06/2020. Contraception: OCP (estrogen/progesterone) Last Pap: Results were: 2019 NIL  Obstetric History: G0P0000  Family History:  Family History  Problem Relation Age of Onset  . Thyroid cancer Mother 36  . Leukemia Paternal Aunt   . Breast cancer Paternal Aunt 40  . Skin cancer Maternal Grandfather     Social History:  Social History   Socioeconomic History  . Marital status: Single    Spouse name: Not on file  . Number of children: Not on file  . Years of education: Not on file  . Highest education level: Not on file  Occupational History  . Occupation: Consulting civil engineer    Comment: [employer] 12th grade  Tobacco Use  . Smoking status: Never Smoker  . Smokeless tobacco: Never Used  Vaping Use  . Vaping Use: Some days  Substance and Sexual Activity  . Alcohol use: Yes    Comment: occ  . Drug use: No  . Sexual activity: Not Currently    Partners: Male    Birth control/protection: Pill  Other Topics Concern  . Not on file  Social History Narrative  . Not on file   Social Determinants of Health   Financial Resource Strain:   . Difficulty of Paying Living Expenses: Not on file  Food Insecurity:   . Worried About Programme researcher, broadcasting/film/video in the Last Year: Not on file  . Ran Out of Food in the Last Year: Not on file  Transportation Needs:   .  Lack of Transportation (Medical): Not on file  . Lack of Transportation (Non-Medical): Not on file  Physical Activity:   . Days of Exercise per Week: Not on file  . Minutes of Exercise per Session: Not on file  Stress:   . Feeling of Stress : Not on file  Social Connections:   . Frequency of Communication with Friends and Family: Not on file  . Frequency of Social Gatherings with Friends and Family: Not on file  .  Attends Religious Services: Not on file  . Active Member of Clubs or Organizations: Not on file  . Attends Banker Meetings: Not on file  . Marital Status: Not on file  Intimate Partner Violence:   . Fear of Current or Ex-Partner: Not on file  . Emotionally Abused: Not on file  . Physically Abused: Not on file  . Sexually Abused: Not on file    Allergies:  Allergies  Allergen Reactions  . Lamisil [Terbinafine]     Medications: Prior to Admission medications   Medication Sig Start Date End Date Taking? Authorizing Provider  busPIRone (BUSPAR) 10 MG tablet Take 15 mg by mouth 2 (two) times daily.  04/08/19  Yes [provider]  cetirizine (ZYRTEC) 10 MG tablet TK 1 T PO BID 10/03/17  Yes [provider]  Cholecalciferol (VITAMIN D-1000 MAX ST) 25 MCG (1000 UT) tablet Take by mouth.   Yes [provider]  desvenlafaxine (PRISTIQ) 100 MG 24 hr tablet Take 100 mg by mouth.    Yes [provider]  diazepam (VALIUM) 5 MG tablet TK 1 TO 2 TS PO HS PRF INSOMNIA 12/08/17  Yes [provider]  meloxicam (MOBIC) 15 MG tablet TK 1 T PO ONCE A DAY 01/23/18  Yes [provider]  methylphenidate (RITALIN) 10 MG tablet Take 10 mg by mouth daily. 04/02/19  Yes [provider]  Multiple Vitamin (MULTI-VITAMIN) tablet Take by mouth.   Yes [provider]  pantoprazole (PROTONIX) 40 MG tablet  04/02/18  Yes [provider]  spironolactone (ALDACTONE) 100 MG tablet TK 1 T PO QD B NOON 04/02/18  Yes [provider]  vitamin B-12 (CYANOCOBALAMIN) 1000 MCG tablet Take by mouth.   Yes [provider]  zaleplon (SONATA) 10 MG capsule TK 2 CS PO QHS 03/29/17  Yes [provider]  aspirin-acetaminophen-caffeine (EXCEDRIN MIGRAINE) 250-250-65 MG per tablet Take 2 tablets by mouth every 6 (six) hours as needed. For migraines.     [provider]  levonorgestrel-ethinyl estradiol (INTROVALE)  0.15-0.03 MG tablet Take 1 tablet by mouth daily. 04/16/20   Natale Milch, MD    Physical Exam Vitals: Height 5\' 8"  (1.727 m), weight (!) 352 lb 6.4 oz (159.8 kg), last menstrual period 04/06/2020.  General: NAD HEENT: normocephalic, anicteric Thyroid: no enlargement, no palpable nodules Pulmonary: No increased work of breathing, CTAB Cardiovascular: RRR, distal pulses 2+ Breast: Breast symmetrical, no tenderness, no palpable nodules or masses, no skin or nipple retraction present, no nipple discharge.  No axillary or supraclavicular lymphadenopathy. Abdomen: NABS, soft, non-tender, non-distended.  Umbilicus without lesions.  No hepatomegaly, splenomegaly or masses palpable. No evidence of hernia  Genitourinary:  External: Normal external female genitalia.  Normal urethral meatus, normal Bartholin's and Skene's glands.    Vagina: Normal vaginal mucosa, no evidence of prolapse.    Cervix: Grossly normal in appearance, no bleeding  Uterus: Non-enlarged, mobile, normal contour.  No CMT  Adnexa: ovaries non-enlarged, no adnexal masses  Rectal:  deferred  Lymphatic: no evidence of inguinal lymphadenopathy Extremities: no edema, erythema, or tenderness Neurologic: Grossly intact Psychiatric: mood appropriate, affect full  Female chaperone present for pelvic and breast  portions of the physical exam    Assessment: 24 y.o. G0P0000 routine annual exam  Plan: Problem List Items Addressed This Visit    None    Visit Diagnoses    Need for immunization against influenza    -  Primary   Relevant Orders   Flu Vaccine QUAD 36+ mos IM (Completed)   Encounter for annual routine gynecological examination       Health maintenance examination       Screening for thyroid disorder       Relevant Orders   TSH + free T4   Screening for deficiency anemia       Relevant Orders   CBC With Differential   Screening for diabetes mellitus       Relevant Orders   Comprehensive metabolic panel    Hemoglobin A1c   Screening cholesterol level       Relevant Orders   Lipid panel   Encounter for screening breast examination       Encounter for gynecological examination without abnormal finding       Encounter for birth control pills maintenance       Relevant Medications   levonorgestrel-ethinyl estradiol (INTROVALE) 0.15-0.03 MG tablet      1) STI screening  wasoffered and declined  2) ASCCP guidelines and rational discussed.  Patient opts for every 3 years screening interval  3) Contraception - the patient is currently using  OCP (estrogen/progesterone).  She is happy with her current form of contraception and plans to continue  4)  Routine healthcare maintenance including cholesterol, diabetes screening discussed To return fasting at a later date  5) Return for fasting labs, asap, annual 1 year.   Natale Milch [facility], [facility] 04/16/2020, 11:04 AM

## 2020-04-16 NOTE — Progress Notes (Signed)
Annual Exam

## 2020-04-16 NOTE — Patient Instructions (Addendum)
Institute [employer] Recommended Dietary Allowances for Calcium and Vitamin D  Age (yr) Calcium Recommended Dietary Allowance (mg/day) Vitamin D Recommended Dietary Allowance (international units/day)  9-18 1,300 600  19-50 1,000 600  51-70 1,200 600  71 and older 1,200 800  Data from [employer]. Dietary reference intakes: calcium, vitamin D. [place], [place]: [employer] Academies [employer]; 2011.     Exercising to Stay Healthy To become healthy and stay healthy, it is recommended that you do moderate-intensity and vigorous-intensity exercise. You can tell that you are exercising at a moderate intensity if your heart starts beating faster and you start breathing faster but can still hold a conversation. You can tell that you are exercising at a vigorous intensity if you are breathing much harder and faster and cannot hold a conversation while exercising. Exercising regularly is important. It has many health benefits, such as:  Improving overall fitness, flexibility, and endurance.  Increasing bone density.  Helping with weight control.  Decreasing body fat.  Increasing muscle strength.  Reducing stress and tension.  Improving overall health. How often should I exercise? Choose an activity that you enjoy, and set realistic goals. Your health care provider can help you make an activity plan that works for you. Exercise regularly as told by your health care provider. This may include:  Doing strength training two times a week, such as: ? Lifting weights. ? Using resistance bands. ? Push-ups. ? Sit-ups. ? Yoga.  Doing a certain intensity of exercise for a given amount of time. Choose from these options: ? A total of 150 minutes of moderate-intensity exercise every week. ? A total of 75 minutes of vigorous-intensity exercise every week. ? A mix of moderate-intensity and vigorous-intensity exercise every week. Children, pregnant women, people who have not exercised  regularly, people who are overweight, and older adults may need to talk with a health care provider about what activities are safe to do. If you have a medical condition, be sure to talk with your health care provider before you start a new exercise program. What are some exercise ideas? Moderate-intensity exercise ideas include:  Walking 1 mile (1.6 km) in about 15 minutes.  Biking.  Hiking.  Golfing.  Dancing.  Water aerobics. Vigorous-intensity exercise ideas include:  Walking 4.5 miles (7.2 km) or more in about 1 hour.  Jogging or running 5 miles (8 km) in about 1 hour.  Biking 10 miles (16.1 km) or more in about 1 hour.  Lap swimming.  Roller-skating or in-line skating.  Cross-country skiing.  Vigorous competitive sports, such as football, basketball, and soccer.  Jumping rope.  Aerobic dancing. What are some everyday activities that can help me to get exercise?  Yard work, such as: ? Pushing a lawn mower. ? Raking and bagging leaves.  Washing your car.  Pushing a stroller.  Shoveling snow.  Gardening.  Washing windows or floors. How can I be more active in my day-to-day activities?  Use stairs instead of an elevator.  Take a walk during your lunch break.  If you drive, park your car farther away from your work or school.  If you take public transportation, get off one stop early and walk the rest of the way.  Stand up or walk around during all of your indoor phone calls.  Get up, stretch, and walk around every 30 minutes throughout the day.  Enjoy exercise with a friend. Support to continue exercising will help you keep a regular routine of activity. What guidelines can   I follow while exercising?  Before you start a new exercise program, talk with your health care provider.  Do not exercise so much that you hurt yourself, feel dizzy, or get very short of breath.  Wear comfortable clothes and wear shoes with good support.  Drink plenty of  water while you exercise to prevent dehydration or heat stroke.  Work out until your breathing and your heartbeat get faster. Where to find more information  [place] Department of Health and Human Services: www.hhs.gov  Centers for Disease Control and Prevention (CDC): www.cdc.gov Summary  Exercising regularly is important. It will improve your overall fitness, flexibility, and endurance.  Regular exercise also will improve your overall health. It can help you control your weight, reduce stress, and improve your bone density.  Do not exercise so much that you hurt yourself, feel dizzy, or get very short of breath.  Before you start a new exercise program, talk with your health care provider. This information is not intended to replace advice given to you by your health care provider. Make sure you discuss any questions you have with your health care provider. Document Revised: 04/28/2017 Document Reviewed: 04/06/2017 Elsevier Patient Education  2020 [employer]   Budget-Friendly Healthy Eating There are many ways to save money at the grocery store and continue to eat healthy. You can be successful if you:  Plan meals according to your budget.  Make a grocery list and only purchase food according to your grocery list.  Prepare food yourself. What are tips for following this plan?  Reading food labels  Compare food labels between brand name foods and the store brand. Often the nutritional value is the same, but the store brand is lower cost.  Look for products that do not have added sugar, fat, or salt (sodium). These often cost the same but are healthier for you. Products may be labeled as: ? Sugar-free. ? Nonfat. ? Low-fat. ? Sodium-free. ? Low-sodium.  Look for lean ground beef labeled as at least 92% lean and 8% fat. Shopping  Buy only the items on your grocery list and go only to the areas of the store that have the items on your list.  Use coupons only for foods  and brands you normally buy. Avoid buying items you wouldn't normally buy simply because they are on sale.  Check online and in newspapers for weekly deals.  Buy healthy items from the bulk bins when available, such as herbs, spices, flour, pasta, nuts, and dried fruit.  Buy fruits and vegetables that are in season. Prices are usually lower on in-season produce.  Look at the unit price on the price tag. Use it to compare different brands and sizes to find out which item is the best deal.  Choose healthy items that are often low-cost, such as carrots, potatoes, apples, bananas, and oranges. Dried or canned beans are a low-cost protein source.  Buy in bulk and freeze extra food. Items you can buy in bulk include meats, fish, poultry, frozen fruits, and frozen vegetables.  Avoid buying "ready-to-eat" foods, such as pre-cut fruits and vegetables and pre-made salads.  If possible, shop around to discover where you can find the best prices. Consider other retailers such as dollar stores, larger wholesale stores, local fruit and vegetable stands, and farmers markets.  Do not shop when you are hungry. If you shop while hungry, it may be hard to stick to your list and budget.  Resist impulse buying. Use your grocery list as   your official plan for the week.  Buy a variety of vegetables and fruits by purchasing fresh, frozen, and canned items.  Look at the top and bottom shelves for deals. Foods at eye level (eye level of an adult or child) are usually more expensive.  Be efficient with your time when shopping. The more time you spend at the store, the more money you are likely to spend.  To save money when choosing more expensive foods like meats and dairy: ? Choose cheaper cuts of meat, such as bone-in chicken thighs and drumsticks instead of skinless and boneless chicken. When you are ready to prepare the chicken, you can remove the skin yourself to make it healthier. ? Choose lean meats like  chicken or turkey instead of beef. ? Choose canned seafood, such as tuna, salmon, or sardines. ? Buy eggs as a low-cost source of protein. ? Buy dried beans and peas, such as lentils, split peas, or kidney beans instead of meats. Dried beans and peas are a good alternative source of protein. ? Buy the larger tubs of yogurt instead of individual-sized containers.  Choose water instead of sodas and other sweetened beverages.  Avoid buying chips, cookies, and other "junk food." These items are usually expensive and not healthy. Cooking  Make extra food and freeze the extras in meal-sized containers or in individual portions for fast meals and snacks.  Pre-cook on days when you have extra time to prepare meals in advance. You can keep these meals in the fridge or freezer and reheat for a quick meal.  When you come home from the grocery store, wash, peel, and cut fruits and vegetables so they are ready to use and eat. This will help reduce food waste. Meal planning  Do not eat out or get fast food. Prepare food at home.  Make a grocery list and make sure to bring it with you to the store. If you have a smart phone, you could use your phone to create your shopping list.  Plan meals and snacks according to a grocery list and budget you create.  Use leftovers in your meal plan for the week.  Look for recipes where you can cook once and make enough food for two meals.  Include budget-friendly meals like stews, casseroles, and stir-fry dishes.  Try some meatless meals or try "no cook" meals like salads.  Make sure that half your plate is filled with fruits or vegetables. Choose from fresh, frozen, or canned fruits and vegetables. If eating canned, remember to rinse them before eating. This will remove any excess salt added for packaging. Summary  Eating healthy on a budget is possible if you plan your meals according to your budget, purchase according to your budget and grocery list, and  prepare food yourself.  Tips for buying more food on a limited budget include buying generic brands, using coupons only for foods you normally buy, and buying healthy items from the bulk bins when available.  Tips for buying cheaper food to replace expensive food include choosing cheaper, lean cuts of meat, and buying dried beans and peas. This information is not intended to replace advice given to you by your health care provider. Make sure you discuss any questions you have with your health care provider. Document Revised: 05/17/2017 Document Reviewed: 05/17/2017 Elsevier Patient Education  2020 [employer].   Bone Health Bones protect organs, store calcium, anchor muscles, and support the whole body. Keeping your bones strong is important, especially as you   get older. You can take actions to help keep your bones strong and healthy. Why is keeping my bones healthy important?  Keeping your bones healthy is important because your body constantly replaces bone cells. Cells get old, and new cells take their place. As we age, we lose bone cells because the body may not be able to make enough new cells to replace the old cells. The amount of bone cells and bone tissue you have is referred to as bone mass. The higher your bone mass, the stronger your bones. The aging process leads to an overall loss of bone mass in the body, which can increase the likelihood of:  Joint pain and stiffness.  Broken bones.  A condition in which the bones become weak and brittle (osteoporosis). A large decline in bone mass occurs in older adults. In women, it occurs about the time of menopause. What actions can I take to keep my bones healthy? Good health habits are important for maintaining healthy bones. This includes eating nutritious foods and exercising regularly. To have healthy bones, you need to get enough of the right minerals and vitamins. Most nutrition experts recommend getting these nutrients from the  foods that you eat. In some cases, taking supplements may also be recommended. Doing certain types of exercise is also important for bone health. What are the nutritional recommendations for healthy bones?  Eating a well-balanced diet with plenty of calcium and vitamin D will help to protect your bones. Nutritional recommendations vary from person to person. Ask your health care provider what is healthy for you. Here are some general guidelines. Get enough calcium Calcium is the most important (essential) mineral for bone health. Most people can get enough calcium from their diet, but supplements may be recommended for people who are at risk for osteoporosis. Good sources of calcium include:  Dairy products, such as low-fat or nonfat milk, cheese, and yogurt.  Dark green leafy vegetables, such as bok choy and broccoli.  Calcium-fortified foods, such as orange juice, cereal, bread, soy beverages, and tofu products.  Nuts, such as almonds. Follow these recommended amounts for daily calcium intake:  Children, age 1-3: 700 mg.  Children, age 4-8: 1,000 mg.  Children, age 9-13: 1,300 mg.  Teens, age 14-18: 1,300 mg.  Adults, age 19-50: 1,000 mg.  Adults, age 51-70: ? Men: 1,000 mg. ? Women: 1,200 mg.  Adults, age 71 or older: 1,200 mg.  Pregnant and breastfeeding females: ? Teens: 1,300 mg. ? Adults: 1,000 mg. Get enough vitamin D Vitamin D is the most essential vitamin for bone health. It helps the body absorb calcium. Sunlight stimulates the skin to make vitamin D, so be sure to get enough sunlight. If you live in a cold climate or you do not get outside often, your health care provider may recommend that you take vitamin D supplements. Good sources of vitamin D in your diet include:  Egg yolks.  Saltwater fish.  Milk and cereal fortified with vitamin D. Follow these recommended amounts for daily vitamin D intake:  Children and teens, age 1-18: 600 international  units.  Adults, age 50 or younger: 400-800 international units.  Adults, age 51 or older: 800-1,000 international units. Get other important nutrients Other nutrients that are important for bone health include:  Phosphorus. This mineral is found in meat, poultry, dairy foods, nuts, and legumes. The recommended daily intake for adult men and adult women is 700 mg.  Magnesium. This mineral is found in seeds, nuts, dark   green vegetables, and legumes. The recommended daily intake for adult men is 400-420 mg. For adult women, it is 310-320 mg.  Vitamin K. This vitamin is found in green leafy vegetables. The recommended daily intake is 120 mg for adult men and 90 mg for adult women. What type of physical activity is best for building and maintaining healthy bones? Weight-bearing and strength-building activities are important for building and maintaining healthy bones. Weight-bearing activities cause muscles and bones to work against gravity. Strength-building activities increase the strength of the muscles that support bones. Weight-bearing and muscle-building activities include:  Walking and hiking.  Jogging and running.  Dancing.  Gym exercises.  Lifting weights.  Tennis and racquetball.  Climbing stairs.  Aerobics. Adults should get at least 30 minutes of moderate physical activity on most days. Children should get at least 60 minutes of moderate physical activity on most days. Ask your health care provider what type of exercise is best for you. How can I find out if my bone mass is low? Bone mass can be measured with an X-ray test called a bone mineral density (BMD) test. This test is recommended for all women who are age 65 or older. It may also be recommended for:  Men who are age 70 or older.  People who are at risk for osteoporosis because of: ? Having bones that break easily. ? Having a long-term disease that weakens bones, such as kidney disease or rheumatoid  arthritis. ? Having menopause earlier than normal. ? Taking medicine that weakens bones, such as steroids, thyroid hormones, or hormone treatment for breast cancer or prostate cancer. ? Smoking. ? Drinking three or more alcoholic drinks a day. If you find that you have a low bone mass, you may be able to prevent osteoporosis or further bone loss by changing your diet and lifestyle. Where can I find more information? For more information, check out the following websites:  National Osteoporosis Foundation: www.nof.org/patients  National Institutes of Health: www.bones.nih.gov  International Osteoporosis Foundation: www.iofbonehealth.org Summary  The aging process leads to an overall loss of bone mass in the body, which can increase the likelihood of broken bones and osteoporosis.  Eating a well-balanced diet with plenty of calcium and vitamin D will help to protect your bones.  Weight-bearing and strength-building activities are also important for building and maintaining strong bones.  Bone mass can be measured with an X-ray test called a bone mineral density (BMD) test. This information is not intended to replace advice given to you by your health care provider. Make sure you discuss any questions you have with your health care provider. Document Revised: 06/12/2017 Document Reviewed: 06/12/2017 Elsevier Patient Education  2020 [employer].   

## 2020-04-20 ENCOUNTER — Other Ambulatory Visit: Payer: Self-pay

## 2020-04-20 ENCOUNTER — Other Ambulatory Visit: Payer: 59

## 2020-04-20 DIAGNOSIS — Z131 Encounter for screening for diabetes mellitus: Secondary | ICD-10-CM

## 2020-04-20 DIAGNOSIS — Z13220 Encounter for screening for lipoid disorders: Secondary | ICD-10-CM

## 2020-04-20 DIAGNOSIS — Z130 Encounter for screening for diseases of the blood and blood-forming organs and certain disorders involving the immune mechanism: Secondary | ICD-10-CM

## 2020-04-20 DIAGNOSIS — Z1329 Encounter for screening for other suspected endocrine disorder: Secondary | ICD-10-CM

## 2020-04-21 ENCOUNTER — Other Ambulatory Visit: Payer: 59

## 2020-04-21 LAB — LIPID PANEL
Chol/HDL Ratio: 3.8 ratio (ref 0.0–4.4)
Cholesterol, Total: 151 mg/dL (ref 100–199)
HDL: 40 mg/dL (ref >39)
LDL Chol Calc (NIH): 87 mg/dL (ref 0–99)
Triglycerides: 136 mg/dL (ref 0–149)
VLDL Cholesterol Cal: 24 mg/dL (ref 5–40)

## 2020-04-21 LAB — COMPREHENSIVE METABOLIC PANEL
ALT: 15 IU/L (ref 0–32)
AST: 15 IU/L (ref 0–40)
Albumin/Globulin Ratio: 1.7 (ref 1.2–2.2)
Albumin: 4 g/dL (ref 3.9–5.0)
Alkaline Phosphatase: 46 IU/L (ref 44–121)
BUN/Creatinine Ratio: 13 (ref 9–23)
BUN: 10 mg/dL (ref 6–20)
Bilirubin Total: 0.2 mg/dL (ref 0.0–1.2)
CO2: 25 mmol/L (ref 20–29)
Calcium: 9.2 mg/dL (ref 8.7–10.2)
Chloride: 103 mmol/L (ref 96–106)
Creatinine, Ser: 0.77 mg/dL (ref 0.57–1.00)
GFR calc Af Amer: 123 mL/min/{1.73_m2} (ref >59)
GFR calc non Af Amer: 107 mL/min/{1.73_m2} (ref >59)
Globulin, Total: 2.4 g/dL (ref 1.5–4.5)
Glucose: 93 mg/dL (ref 65–99)
Potassium: 4.6 mmol/L (ref 3.5–5.2)
Sodium: 141 mmol/L (ref 134–144)
Total Protein: 6.4 g/dL (ref 6.0–8.5)

## 2020-04-21 LAB — CBC WITH DIFFERENTIAL
Basophils Absolute: 0 10*3/uL (ref 0.0–0.2)
Basos: 0 %
EOS (ABSOLUTE): 0.1 10*3/uL (ref 0.0–0.4)
Eos: 1 %
Hematocrit: 36.7 % (ref 34.0–46.6)
Hemoglobin: 12.3 g/dL (ref 11.1–15.9)
Immature Grans (Abs): 0 10*3/uL (ref 0.0–0.1)
Immature Granulocytes: 0 %
Lymphocytes Absolute: 2.9 10*3/uL (ref 0.7–3.1)
Lymphs: 28 %
MCH: 29.6 pg (ref 26.6–33.0)
MCHC: 33.5 g/dL (ref 31.5–35.7)
MCV: 88 fL (ref 79–97)
Monocytes Absolute: 0.6 10*3/uL (ref 0.1–0.9)
Monocytes: 6 %
Neutrophils Absolute: 6.6 10*3/uL (ref 1.4–7.0)
Neutrophils: 65 %
RBC: 4.15 x10E6/uL (ref 3.77–5.28)
RDW: 12.1 % (ref 11.7–15.4)
WBC: 10.3 10*3/uL (ref 3.4–10.8)

## 2020-04-21 LAB — HEMOGLOBIN A1C
Est. average glucose Bld gHb Est-mCnc: 134 mg/dL
Hgb A1c MFr Bld: 6.3 % — ABNORMAL HIGH (ref 4.8–5.6)

## 2020-04-21 LAB — TSH+FREE T4
Free T4: 1.13 ng/dL (ref 0.82–1.77)
TSH: 6.67 u[IU]/mL — ABNORMAL HIGH (ref 0.450–4.500)
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
## 2020-07-02 ENCOUNTER — Telehealth (INDEPENDENT_AMBULATORY_CARE_PROVIDER_SITE_OTHER): Payer: 59 | Admitting: Psychiatry

## 2020-07-02 ENCOUNTER — Encounter: Payer: Self-pay | Admitting: Psychiatry

## 2020-07-02 ENCOUNTER — Other Ambulatory Visit: Payer: Self-pay

## 2020-07-02 DIAGNOSIS — F330 Major depressive disorder, recurrent, mild: Secondary | ICD-10-CM

## 2020-07-13 ENCOUNTER — Telehealth: Payer: Self-pay

## 2020-07-13 NOTE — Telephone Encounter (Signed)
pt called left a message that she will need refills on all her medications.

## 2020-07-13 NOTE — Telephone Encounter (Signed)
She has several meds which has been prescribed by other provider. Could you ask her which med she needs refill? Thanks.

## 2020-07-14 ENCOUNTER — Other Ambulatory Visit: Payer: Self-pay | Admitting: Psychiatry

## 2020-07-14 DIAGNOSIS — F419 Anxiety disorder, unspecified: Secondary | ICD-10-CM

## 2020-07-14 DIAGNOSIS — R41840 Attention and concentration deficit: Secondary | ICD-10-CM

## 2020-07-14 NOTE — Telephone Encounter (Signed)
Could you ask her which pharmacy she uses. Please also remind her that ritalin will not be prescribed until she has neuropsych testing/uds done. Advise her to get uds at any [facility].

## 2020-07-14 NOTE — Telephone Encounter (Signed)
Sorry for the type of. Pt states that she been working for 8 hours straight.  She stated that she has not received her labwork orders to mail them to her and when I ask her about the  neuropsych testing she stated that she had not had time to call them back to make and appt but she will do it today.

## 2020-07-14 NOTE — Telephone Encounter (Signed)
Noted. Let me know which pharmacy she uses so that I can call in the rest of her medication. Although labs is ordered electronically, please print out and mail it to her if she prefers that way.

## 2020-07-14 NOTE — Telephone Encounter (Signed)
pt states she been 8 hours straight for last few weeks she will call today and make appt. she also states she needs the labwork orders mailed to her

## 2020-07-14 NOTE — Telephone Encounter (Signed)
pt return call she states that you were takin over care for her medication. she needs buspirone, ritalin, zaleplon and pristiq

## 2020-07-20 ENCOUNTER — Other Ambulatory Visit: Payer: Self-pay | Admitting: Psychiatry

## 2020-07-20 MED ORDER — DESVENLAFAXINE SUCCINATE ER 100 MG PO TB24: 100.0000 mg | ORAL_TABLET | Freq: Every day | ORAL | 0 refills | Status: DC

## 2020-07-20 MED ORDER — BUSPIRONE HCL 30 MG PO TABS: 30.0000 mg | ORAL_TABLET | Freq: Every day | ORAL | 0 refills | Status: DC

## 2020-07-23 NOTE — Progress Notes (Signed)
Virtual Visit via Video Note  I connected with Martha [name] on 07/30/20 at  2:30 PM EST by a video enabled telemedicine application and verified that I am speaking with the correct person using two identifiers.  Location: Patient: home Provider: office Persons participated in the visit- patient, provider    I discussed the limitations of evaluation and management by telemedicine and the availability of in person appointments. The patient expressed understanding and agreed to proceed.      I discussed the assessment and treatment plan with the patient. The patient was provided an opportunity to ask questions and all were answered. The patient agreed with the plan and demonstrated an understanding of the instructions.   The patient was advised to call back or seek an in-person evaluation if the symptoms worsen or if the condition fails to improve as anticipated.  I provided 20 minutes of non-face-to-face time during this encounter.   Martha Hottereina Kahmari Koller, MD    [facility] MD/[facility]/NP OP Progress Note  07/30/2020 3:05 PM Martha [name]  MRN:  [id number]  Chief Complaint:  Chief Complaint    Follow-up; Depression     HPI:  This is a follow-up appointment for depression.  She states that she was extremely busy.  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  She was burnout, and she quit the job few days ago.  She has been feeling better since then.  Although she used to have panic attacks when she was around this manager, and had anticipatory anxiety before going to work, she feels that weights have been lifted. She has been doing more house duties, and her father is fine with the current situation as long as she does these things.  She is also seeking a job as she is concerned about financial strain.  She visits her grandmother when she is outside the facility.  She is concerned that disease is progressing. She has been doing well at school.  She sleeps better for  the past few days.  She feels less depressed.  She has been able to focus better.  She denies change in weight or appetite.  She denies SI.  She feels comfortable to stay on her medication.   Daily routine: School work, Dietitianvideo games, social media Exercise:  Employment: quit in March, used to work as [employer] for one year Support: mother Household: parents, one Medical laboratory scientific officercat. Older brother lives next to them.  Marital status: single Number of children: 0  Education: [employer], will be graduating in June, majoring in medical office administration. She was reportedly diagnosed with ADHD. 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 Mild episode of recurrent major depressive disorder (HCC)  F33.0   2. Insomnia, unspecified type  G47.00     Past Psychiatric History: Please see initial evaluation for full details. I have reviewed the history. No updates at this time.     Past Medical History:  Past Medical History:  Diagnosis Date  . ADHD (attention deficit hyperactivity disorder)    no meds taken  . Anxiety   . Diabetes mellitus    pre-diabetic,told when younger  . Family history of breast cancer    12/20 cancer genetic testing letter sent  . Headache(784.0)    chronic migraines, decrease since BCP started  . Idiopathic intracranial hypertension   . Insomnia   . Irregular menstrual cycle    pt states she  started BCP,unsure of name to help regulate cycle  . Obesity   . Vision abnormalities    near sighted,wears glasses    Past Surgical History:  Procedure Laterality Date  . CHOLECYSTECTOMY    . TONSILLECTOMY     at 25yo    Family Psychiatric History: Please see initial evaluation for full details. I have reviewed the history. No updates at this time.     Family History:  Family History  Problem Relation Age of Onset  . Thyroid cancer Mother 63  . Depression Mother   . Leukemia Paternal Aunt   . Breast cancer Paternal Aunt 40   . Skin cancer Maternal Grandfather   . Anxiety disorder Father     Social History:  Social History   Socioeconomic History  . Marital status: Single    Spouse name: Not on file  . Number of children: Not on file  . Years of education: Not on file  . Highest education level: Not on file  Occupational History  . Occupation: Consulting civil engineer    Comment: [employer] 12th grade  Tobacco Use  . Smoking status: Never Smoker  . Smokeless tobacco: Never Used  Vaping Use  . Vaping Use: Some days  Substance and Sexual Activity  . Alcohol use: Yes    Comment: occ  . Drug use: No  . Sexual activity: Not Currently    Partners: Male    Birth control/protection: Pill  Other Topics Concern  . Not on file  Social History Narrative  . Not on file   Social Determinants of Health   Financial Resource Strain: Not on file  Food Insecurity: Not on file  Transportation Needs: Not on file  Physical Activity: Not on file  Stress: Not on file  Social Connections: Not on file    Allergies:  Allergies  Allergen Reactions  . Lamisil [Terbinafine]     Metabolic Disorder Labs: Lab Results  Component Value Date   HGBA1C 6.3 (H) 04/20/2020   No results found for: PROLACTIN Lab Results  Component Value Date   CHOL 151 04/20/2020   TRIG 136 04/20/2020   HDL 40 04/20/2020   CHOLHDL 3.8 04/20/2020   LDLCALC 87 04/20/2020   Lab Results  Component Value Date   TSH 6.670 (H) 04/20/2020   TSH 2.405 02/23/2015    Therapeutic Level Labs: No results found for: LITHIUM No results found for: VALPROATE No components found for:  CBMZ  Current Medications: Current Outpatient Medications  Medication Sig Dispense Refill  . aspirin-acetaminophen-caffeine (EXCEDRIN MIGRAINE) 250-250-65 MG per tablet Take 2 tablets by mouth every 6 (six) hours as needed. For migraines.     Melene Muller ON 08/17/2020] busPIRone (BUSPAR) 30 MG tablet Take 1 tablet (30 mg total) by mouth at bedtime. 30 tablet 2  .  cetirizine (ZYRTEC) 10 MG tablet TK 1 T PO BID    . Cholecalciferol (VITAMIN D-1000 MAX ST) 25 MCG (1000 UT) tablet Take by mouth.    Melene Muller ON 08/17/2020] desvenlafaxine (PRISTIQ) 100 MG 24 hr tablet Take 1 tablet (100 mg total) by mouth daily. 30 tablet 2  . levonorgestrel-ethinyl estradiol (INTROVALE) 0.15-0.03 MG tablet Take 1 tablet by mouth daily. 91 tablet 4  . meloxicam (MOBIC) 15 MG tablet TK 1 T PO ONCE A DAY  2  . methylphenidate (RITALIN) 10 MG tablet Take 10 mg by mouth daily.    . Multiple Vitamin (MULTI-VITAMIN) tablet Take by mouth.    . pantoprazole (PROTONIX) 40 MG tablet  11  . spironolactone (ALDACTONE) 100 MG tablet TK 1 T PO QD B NOON  9  . vitamin B-12 (CYANOCOBALAMIN) 1000 MCG tablet Take by mouth.    . zaleplon (SONATA) 10 MG capsule TK 2 CS PO QHS  1   No current facility-administered medications for this visit.     Musculoskeletal: Strength & Muscle Tone: N/A Gait & Station: N/A Patient leans: N/A  Psychiatric Specialty Exam: Review of Systems  Psychiatric/Behavioral: Positive for dysphoric mood and sleep disturbance. Negative for agitation, behavioral problems, confusion, decreased concentration, hallucinations, self-injury and suicidal ideas. The patient is nervous/anxious. The patient is not hyperactive.   All other systems reviewed and are negative.   There were no vitals taken for this visit.There is no height or weight on file to calculate BMI.  General Appearance: Fairly Groomed  Eye Contact:  Good  Speech:  Clear and Coherent  Volume:  Normal  Mood:  better  Affect:  Appropriate, Congruent and less down  Thought Process:  Coherent  Orientation:  Full (Time, Place, and Person)  Thought Content: Logical   Suicidal Thoughts:  No  Homicidal Thoughts:  No  Memory:  Immediate;   Good  Judgement:  Good  Insight:  Good  Psychomotor Activity:  Normal  Concentration:  Concentration: Good and Attention Span: Good  Recall:  Good  Fund of Knowledge:  Good  Language: Good  Akathisia:  No  Handed:  Right  AIMS (if indicated): not done  Assets:  Communication Skills Desire for Improvement  ADL's:  Intact  Cognition: WNL  Sleep:  Fair   Screenings: GAD-7   Flowsheet Row Office Visit from [facility]/16/2020 in [facility] Office Visit from 04/15/2019 in [facility]  Total GAD-7 Score 7 10    PHQ2-9   Flowsheet Row Office Visit from 04/16/2020 in [facility] Office Visit from 04/15/2019 in [facility]  PHQ-2 Total Score 2 1  PHQ-9 Total Score 9 10    Flowsheet Row Video Visit [facility] 07/30/2020 in [facility]  C-SSRS RISK CATEGORY No Risk    PHQ9 not done due to significant improvement/change in her mood symptoms over the past few days   Assessment and Plan:  TOREY REGAN is a 25 y.o. year old female with a history of depression, anxiety, ADHD by history, migraine, GERD , who presents for follow up appointment for below.   1. Mild episode of recurrent major depressive disorder (HCC) She reports significant improvement in depressive symptoms since quitting her job a few days ago.  Other psychosocial stressors includes financial strain, conflict with her father, medical condition of her grandmother with Alzheimer disease, who used to be a mother for a year as a child.  She has grief of loss of her grandfather and her aunt as well.  Will continue current medication regimen given she reports significant improvement in her mood symptoms over the past few days.  Will continue Pristiq to target depression.  Will continue BuSpar for anxiety.  She will greatly benefit from CBT; she has an appointment today.   # Insomnia She has pending another sleep evaluation after payment is done.  She is aware to pursue another evaluation after this payment. Will continue zaleplon at the current dose to target insomnia.   # history of ADHD She was reportedly diagnosed with ADHD and learning  disability as a child.  She is willing to have an exam given she does not have any recent test/record of  this diagnosis.   Plan - she will contact the clinic if she needs refill.  1. Continue Pristiq 100 mg daily  2. Continue buspirone 30 mg at night  3. Continue Zaleplon 10 mg qhs - she has another refill from other provider 4. Continue Ritalin 10 mg daily- she verbalized her understanding that this medication will not be filled unless she gets UDS and schedule testing for ADHD.  5. Referral to ADHD testing 6. Next appointment: 4/14 at 3 PM for 30 mins, video - She will have an appointment for therapy today - She has an upcoming appointment with her neurology for migraine  The patient demonstrates the following risk factors for suicide: Chronic risk factors for suicide include: psychiatric disorder of depression, anxiety. Acute risk factors for suicide include: family or marital conflict. Protective factors for this patient include: positive social support, coping skills and hope for the future. Considering these factors, the overall suicide risk at this point appears to be low. Patient is appropriate for outpatient follow up.   Martha Hotter, MD 07/30/2020, 3:05 PM

## 2020-07-30 ENCOUNTER — Other Ambulatory Visit: Payer: Self-pay

## 2020-07-30 ENCOUNTER — Ambulatory Visit (INDEPENDENT_AMBULATORY_CARE_PROVIDER_SITE_OTHER): Payer: 59 | Admitting: Licensed Clinical Social Worker

## 2020-07-30 ENCOUNTER — Telehealth (INDEPENDENT_AMBULATORY_CARE_PROVIDER_SITE_OTHER): Payer: 59 | Admitting: Psychiatry

## 2020-07-30 ENCOUNTER — Encounter: Payer: Self-pay | Admitting: Psychiatry

## 2020-07-30 DIAGNOSIS — F330 Major depressive disorder, recurrent, mild: Secondary | ICD-10-CM

## 2020-07-30 DIAGNOSIS — G4700 Insomnia, unspecified: Secondary | ICD-10-CM

## 2020-07-30 MED ORDER — DESVENLAFAXINE SUCCINATE ER 100 MG PO TB24: 100.0000 mg | ORAL_TABLET | Freq: Every day | ORAL | 2 refills | Status: DC

## 2020-07-30 MED ORDER — BUSPIRONE HCL 30 MG PO TABS: 30.0000 mg | ORAL_TABLET | Freq: Every day | ORAL | 2 refills | Status: DC
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
## 2020-08-24 ENCOUNTER — Ambulatory Visit (INDEPENDENT_AMBULATORY_CARE_PROVIDER_SITE_OTHER): Payer: 59 | Admitting: Licensed Clinical Social Worker

## 2020-08-24 ENCOUNTER — Other Ambulatory Visit: Payer: Self-pay

## 2020-08-24 DIAGNOSIS — F330 Major depressive disorder, recurrent, mild: Secondary | ICD-10-CM | POA: Diagnosis not present

## 2020-08-24 NOTE — Progress Notes (Signed)
Virtual Visit via Video Note  I connected with Martha [name] on 08/24/20 at  2:00 PM EDT by a video enabled telemedicine application and verified that I am speaking with the correct person using two identifiers.  Location: Patient: home Provider: remote office [place], [place])   I discussed the limitations of evaluation and management by telemedicine and the availability of in person appointments. The patient expressed understanding and agreed to proceed.   I discussed the assessment and treatment plan with the patient. The patient was provided an opportunity to ask questions and all were answered. The patient agreed with the plan and demonstrated an understanding of the instructions.   The patient was advised to call back or seek an in-person evaluation if the symptoms worsen or if the condition fails to improve as anticipated.  I provided 35 minutes of non-face-to-face time during this encounter.   Younique Casad R Oden Lindaman, LCSW    THERAPIST PROGRESS NOTE  Session Time: 2-2:35p  Participation Level: Active  Behavioral Response: Neat and Well GroomedAlertAnxious  Type of Therapy: Individual Therapy  Treatment Goals addressed: Anxiety and Coping  Interventions: CBT and Reframing  Summary: Martha [name] is a 25 y.o. female who presents with improving symptoms related to depression diagnosis. Pt reports that overall mood is stable and that she is managing stress and anxiety well. Pt reports that she is getting fair quality and quantity of sleep, depending on whether she is getting distracted at bedtime.   Allowed pt to  explore and express thoughts and feelings about new job. Pt is working at [employer]. Not sure if she likes it or not--has only worked two shifts. Discussed previous job at [employer] and how pt left--just walked out. 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 Pt currently going to school for medical office administration.  Reviewed current coping skills and  encouraged pt to continue utilizing.  Continued recommendations are as follows: self care behaviors, positive social engagements, focusing on overall work/home/life balance, and focusing on positive physical and emotional wellness.   Suicidal/Homicidal: No  Therapist Response: Jahni reports that her overall mood has improved and that she is trying to incorporate activities to boost socialization. Jesica is active in overall academic achievement and gaining and maintaining healthy eating patterns. Saidy is continuing to work on developing healthy cognitive patterns and beliefs about self and the world that help alleviate relapse of depression symptoms. These behaviors are reflective of personal growth and progress. Treatment to continue.  Plan: Return again in 3 weeks.  Diagnosis: Axis I: MDD, mild    Axis II: No diagnosis    Ernest Haber Pleasant Bensinger, LCSW 08/24/2020

## 2020-09-03 ENCOUNTER — Other Ambulatory Visit: Payer: Self-pay | Admitting: Psychiatry

## 2020-09-03 ENCOUNTER — Telehealth: Payer: Self-pay

## 2020-09-03 MED ORDER — ZALEPLON 10 MG PO CAPS: 10.0000 mg | ORAL_CAPSULE | Freq: Every day | ORAL | 0 refills | Status: DC

## 2020-09-03 NOTE — Progress Notes (Signed)
Virtual Visit via Video Note  I connected with Martha [name] on 09/09/20 at  2:00 PM EDT by a video enabled telemedicine application and verified that I am speaking with the correct person using two identifiers.  Location: Patient: home Provider: office Persons participated in the visit- patient, provider   I discussed the limitations of evaluation and management by telemedicine and the availability of in person appointments. The patient expressed understanding and agreed to proceed.   I discussed the assessment and treatment plan with the patient. The patient was provided an opportunity to ask questions and all were answered. The patient agreed with the plan and demonstrated an understanding of the instructions.   The patient was advised to call back or seek an in-person evaluation if the symptoms worsen or if the condition fails to improve as anticipated.  I provided 15 minutes of non-face-to-face time during this encounter.   Neysa Hotter, MD    [facility] MD/PA/NP OP Progress Note  09/09/2020 2:30 PM Martha [name]  MRN:  [id number]  Chief Complaint:  Chief Complaint    Depression; Follow-up     HPI:  This is a follow-up appointment for depression and insomnia.  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  It has been going well.  She is now doing 4 hours shift.  She feels less stressed compared to the job she had before.  She will be done for school in 3 weeks.  She believes she has been handling things well.  She has depressive symptoms as in PHQ-9.  She denies SI.  She was sleeping better on the higher dose of Zaleplon.  She is trying to make payment to have another sleep study, although she does not know when she can do it.  She denies alcohol use or drug use.   Daily routine:School work, Clinical cytogeneticist games, social media Exercise:  Employment: works in a Teacher, music,  used to work [employer] for one year Support:mother Household:parents, one cat.  Older brother lives next to them. 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 She was reportedly diagnosed with ADHD. 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 Mild episode of recurrent major depressive disorder (HCC)  F33.0   2. Insomnia, unspecified type  G47.00     Past Psychiatric History: Please see initial evaluation for full details. I have reviewed the history. No updates at this time.     Past Medical History:  Past Medical History:  Diagnosis Date  . ADHD (attention deficit hyperactivity disorder)    no meds taken  . Anxiety   . Diabetes mellitus    pre-diabetic,told when younger  . Family history of breast cancer    12/20 cancer genetic testing letter sent  . Headache(784.0)    chronic migraines, decrease since BCP started  . Idiopathic intracranial hypertension   . Insomnia   . Irregular menstrual cycle    pt states she started BCP,unsure of name to help regulate cycle  . Obesity   . Vision abnormalities    near sighted,wears glasses    Past Surgical History:  Procedure Laterality Date  . CHOLECYSTECTOMY    . TONSILLECTOMY     at 25yo    Family Psychiatric History: Please see initial evaluation for full details. I have reviewed the history. No updates at this time.     Family  History:  Family History  Problem Relation Age of Onset  . Thyroid cancer Mother 32  . Depression Mother   . Leukemia Paternal Aunt   . Breast cancer Paternal Aunt 40  . Skin cancer Maternal Grandfather   . Anxiety disorder Father     Social History:  Social History   Socioeconomic History  . Marital status: Single    Spouse name: Not on file  . Number of children: Not on file  . Years of education: Not on file  . Highest education level: Not on file  Occupational History  . Occupation: Consulting civil engineer    Comment: [employer]  12th grade  Tobacco Use  . Smoking status: Never Smoker  . Smokeless tobacco: Never Used  Vaping Use  . Vaping Use: Some days  Substance and Sexual Activity  . Alcohol use: Yes    Comment: occ  . Drug use: No  . Sexual activity: Not Currently    Partners: Male    Birth control/protection: Pill  Other Topics Concern  . Not on file  Social History Narrative  . Not on file   Social Determinants of Health   Financial Resource Strain: Not on file  Food Insecurity: Not on file  Transportation Needs: Not on file  Physical Activity: Not on file  Stress: Not on file  Social Connections: Not on file    Allergies:  Allergies  Allergen Reactions  . Lamisil [Terbinafine]     Metabolic Disorder Labs: Lab Results  Component Value Date   HGBA1C 6.3 (H) 04/20/2020   No results found for: PROLACTIN Lab Results  Component Value Date   CHOL 151 04/20/2020   TRIG 136 04/20/2020   HDL 40 04/20/2020   CHOLHDL 3.8 04/20/2020   LDLCALC 87 04/20/2020   Lab Results  Component Value Date   TSH 6.670 (H) 04/20/2020   TSH 2.405 02/23/2015    Therapeutic Level Labs: No results found for: LITHIUM No results found for: VALPROATE No components found for:  CBMZ  Current Medications: Current Outpatient Medications  Medication Sig Dispense Refill  . aspirin-acetaminophen-caffeine (EXCEDRIN MIGRAINE) 250-250-65 MG per tablet Take 2 tablets by mouth every 6 (six) hours as needed. For migraines.     Melene Muller ON 11/17/2020] busPIRone (BUSPAR) 30 MG tablet Take 1 tablet (30 mg total) by mouth at bedtime. 30 tablet 0  . cetirizine (ZYRTEC) 10 MG tablet TK 1 T PO BID    . Cholecalciferol (VITAMIN D-1000 MAX ST) 25 MCG (1000 UT) tablet Take by mouth.    Melene Muller ON 11/17/2020] desvenlafaxine (PRISTIQ) 100 MG 24 hr tablet Take 1 tablet (100 mg total) by mouth daily. 30 tablet 0  . levonorgestrel-ethinyl estradiol (INTROVALE) 0.15-0.03 MG tablet Take 1 tablet by mouth daily. 91 tablet 4  .  meloxicam (MOBIC) 15 MG tablet TK 1 T PO ONCE A DAY  2  . methylphenidate (RITALIN) 10 MG tablet Take 10 mg by mouth daily.    . Multiple Vitamin (MULTI-VITAMIN) tablet Take by mouth.    . pantoprazole (PROTONIX) 40 MG tablet   11  . spironolactone (ALDACTONE) 100 MG tablet TK 1 T PO QD B NOON  9  . vitamin B-12 (CYANOCOBALAMIN) 1000 MCG tablet Take by mouth.    Melene Muller ON 09/22/2020] zaleplon (SONATA) 10 MG capsule Take 2 capsules (20 mg total) by mouth at bedtime. 60 capsule 2   No current facility-administered medications for this visit.     Musculoskeletal: Strength & Muscle Tone: N/A  Gait & Station: N/A Patient leans: N/A  Psychiatric Specialty Exam: Review of Systems  Psychiatric/Behavioral: Positive for decreased concentration, dysphoric mood and sleep disturbance. Negative for agitation, behavioral problems, confusion, hallucinations, self-injury and suicidal ideas. The patient is not nervous/anxious and is not hyperactive.   All other systems reviewed and are negative.   There were no vitals taken for this visit.There is no height or weight on file to calculate BMI.  General Appearance: Fairly Groomed  Eye Contact:  Good  Speech:  Clear and Coherent  Volume:  Normal  Mood:  good  Affect:  Appropriate, Congruent and slightly fatigued  Thought Process:  Coherent  Orientation:  Full (Time, Place, and Person)  Thought Content: Logical   Suicidal Thoughts:  No  Homicidal Thoughts:  No  Memory:  Immediate;   Good  Judgement:  Good  Insight:  Fair  Psychomotor Activity:  Normal  Concentration:  Concentration: Good and Attention Span: Good  Recall:  Good  Fund of Knowledge: Good  Language: Good  Akathisia:  No  Handed:  Right  AIMS (if indicated): not done  Assets:  Communication Skills Desire for Improvement  ADL's:  Intact  Cognition: WNL  Sleep:  Poor   Screenings: GAD-7   Flowsheet Row Office Visit from 04/16/2020 in [facility] Office Visit from  04/15/2019 in [facility]  Total GAD-7 Score 7 10    PHQ2-9   Flowsheet Row Video Visit from 09/09/2020 in [facility] Office Visit from 04/16/2020 in [facility] Office Visit from 04/15/2019 in [facility]  PHQ-2 Total Score 2 2 1   PHQ-9 Total Score 9 9 10     Flowsheet Row Video Visit from 09/09/2020 in [facility] Counselor from 08/24/2020 in [facility] Video Visit from 07/30/2020 in [name]  C-SSRS RISK CATEGORY No Risk No Risk No Risk       Assessment and Plan:  Martha [name] is a 26 y.o. year old female with a history of  depression, anxiety, ADHD by history, migraine, GERD, who presents for follow up appointment for below.    1. Mild episode of recurrent major depressive disorder (HCC) There has been overall improvement in her mood symptoms since changing in her job.  Other psychosocial stressors includes financial strain, conflict with her father, medical condition of her grandmother with Alzheimer disease, who used to be a mother for a year as a child.  She has grief of loss of her grandfather and her aunt as well.   Will continue Pristiq to target depression.  We will continue BuSpar for anxiety.  She will continue to see a therapist.   2. Insomnia, unspecified type She has limited benefit from the Zaleplon.  We uptitrate the dose to optimize treatment for insomnia.  Noted that she does have a pending sleep evaluation after payment is done.  She is encouraged to arrange this to ensure appropriate management.   # history of ADHD She was reportedly diagnosed with ADHD and learning disability as a child.Referral was made to neuropsychological testing.  She is advised to call the clinic for an appointment. Will obtain UDS.    Plan - she will contact the clinic if she needs refill.  1. ContinuePristiq 100 mg daily 2.  Continuebuspirone30 mg at night  3. ContinueZaleplon 10 mg qhs- she has another refill from other provider 4. Continue Ritalin 10 mg daily- sheverbalized her understanding that this medication will not be  filled unless shegets UDS and schedule testing for ADHD.  5. Referral to ADHD testing 6. Next appointment: 7/12 at 1 PM for 20 mins, video - She has an upcoming appointment with her neurology for migraine  The patient demonstrates the following risk factors for suicide: Chronic risk factors for suicide include:psychiatric disorder ofdepression, anxiety. Acute risk factorsfor suicide include: family or marital conflict. Protective factorsfor this patient include: positive social support, coping skills and hope for the future. Considering these factors, the overall suicide risk at this point appears to below. Patientisappropriate for outpatient follow up.   Neysa Hottereina Darin Redmann, MD 09/09/2020, 2:30 PM

## 2020-09-03 NOTE — Telephone Encounter (Signed)
Ordered.   I have utilized the [place] Controlled Substances Reporting System (PMP AWARxE) to confirm adherence regarding the patient's medication. My review reveals appropriate prescription fills.

## 2020-09-03 NOTE — Telephone Encounter (Signed)
pt called states she needs refill on the zaleplon that she is out and she needs it to sleep.

## 2020-09-09 ENCOUNTER — Telehealth: Payer: Self-pay

## 2020-09-09 ENCOUNTER — Other Ambulatory Visit: Payer: Self-pay

## 2020-09-09 ENCOUNTER — Encounter: Payer: Self-pay | Admitting: Psychiatry

## 2020-09-09 ENCOUNTER — Telehealth (INDEPENDENT_AMBULATORY_CARE_PROVIDER_SITE_OTHER): Payer: 59 | Admitting: Psychiatry

## 2020-09-09 DIAGNOSIS — G4700 Insomnia, unspecified: Secondary | ICD-10-CM

## 2020-09-09 DIAGNOSIS — F330 Major depressive disorder, recurrent, mild: Secondary | ICD-10-CM | POA: Diagnosis not present

## 2020-09-09 MED ORDER — DESVENLAFAXINE SUCCINATE ER 100 MG PO TB24: 100.0000 mg | ORAL_TABLET | Freq: Every day | ORAL | 0 refills | Status: DC

## 2020-09-09 MED ORDER — BUSPIRONE HCL 30 MG PO TABS: 30.0000 mg | ORAL_TABLET | Freq: Every day | ORAL | 0 refills | Status: DC

## 2020-09-09 MED ORDER — ZALEPLON 10 MG PO CAPS: 20.0000 mg | ORAL_CAPSULE | Freq: Every day | ORAL | 2 refills | Status: DC

## 2020-09-09 NOTE — Telephone Encounter (Signed)
faxed and confirmed labwork order for toxassure select 13 unrine dx r41.840. sent to [facility]

## 2020-09-10 ENCOUNTER — Telehealth: Payer: 59 | Admitting: Psychiatry

## 2020-09-21 ENCOUNTER — Ambulatory Visit (INDEPENDENT_AMBULATORY_CARE_PROVIDER_SITE_OTHER): Payer: 59 | Admitting: Licensed Clinical Social Worker

## 2020-09-21 ENCOUNTER — Other Ambulatory Visit: Payer: Self-pay

## 2020-09-21 DIAGNOSIS — F330 Major depressive disorder, recurrent, mild: Secondary | ICD-10-CM
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
## 2020-10-14 ENCOUNTER — Other Ambulatory Visit: Payer: Self-pay

## 2020-10-14 ENCOUNTER — Ambulatory Visit (INDEPENDENT_AMBULATORY_CARE_PROVIDER_SITE_OTHER): Payer: 59 | Admitting: Licensed Clinical Social Worker

## 2020-10-14 DIAGNOSIS — F330 Major depressive disorder, recurrent, mild: Secondary | ICD-10-CM

## 2020-10-14 NOTE — Progress Notes (Signed)
Virtual Visit via Video Note  I connected with Martha [name] on 10/14/20 at  1:00 PM EDT by a video enabled telemedicine application and verified that I am speaking with the correct person using two identifiers.  Location: Patient: home Provider: [facility]   I discussed the limitations of evaluation and management by telemedicine and the availability of in person appointments. The patient expressed understanding and agreed to proceed.  I discussed the assessment and treatment plan with the patient. The patient was provided an opportunity to ask questions and all were answered. The patient agreed with the plan and demonstrated an understanding of the instructions.   The patient was advised to call back or seek an in-person evaluation if the symptoms worsen or if the condition fails to improve as anticipated.  I provided 41 minutes of non-face-to-face time during this encounter.   Delphia Kaylor R Alban Marucci, LCSW    THERAPIST PROGRESS NOTE  Session Time: 1-1:41p  Participation Level: Active  Behavioral Response: Neat and Well GroomedAlertAnxious  Type of Therapy: Individual Therapy  Treatment Goals addressed: Anxiety  Interventions: CBT, Strength-based and Supportive  Summary: Martha [name] is a 25 y.o. female who presents with symptoms consistent with depression.  Pt reports that currently her mood is stable and that she is managing stress and anxiety well.   Allowed pt to explore and express thoughts and feelings associated with recent external stressors. Pt reports that it is now the end of semester and pt is very happy that she is finished with her degree. Pt reports that she wants to graduate with cap and gown, but doesn't feel if anyone would attend graduation. "My mom is going to a horse show with my nieces.   Pt expressed concerns about recent diagnosis of hashimoto's. "at least I know what's been going on with me". Pt reports that she also has hypertension and is currently taking  medication to control that. Pt did not reflect any additional lifestyle changes that were recommended or that she would like to pursue.   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 Pt was very appreciative and is currently working half-shifts (four hours).    Pt wants to pursue a full time job and wants that search to start next week. Pt is feeling more accomplished with improved sense of self worth.  Continued recommendations are as follows: self care behaviors, positive social engagements, focusing on overall work/home/life balance, and focusing on positive physical and emotional wellness.   Suicidal/Homicidal: No  Therapist Response: Martha [name] is reporting an elevation in mood and is functioning well academically and vocationally. Martha [name] is able to verbally express understanding of the relationship between depressed mood and repression of feelings. Martha [name] is expressing feelings more during session. These behaviors are reflective of both personal growth and progress. Treatment to continue as indicated.  Plan: Return again in 4 weeks.  Diagnosis: Axis I: MDD, mild    Axis II: No diagnosis    Ernest Haber Shaterra Sanzone, LCSW 10/14/2020

## 2020-11-11 ENCOUNTER — Ambulatory Visit (INDEPENDENT_AMBULATORY_CARE_PROVIDER_SITE_OTHER): Payer: 59 | Admitting: Licensed Clinical Social Worker

## 2020-11-11 ENCOUNTER — Other Ambulatory Visit: Payer: Self-pay

## 2020-11-11 DIAGNOSIS — F330 Major depressive disorder, recurrent, mild: Secondary | ICD-10-CM
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
## 2020-11-19 ENCOUNTER — Telehealth: Payer: Self-pay

## 2020-11-19 NOTE — Telephone Encounter (Signed)
pt called very upset she stated that you stopped her ritalin because she had not done labwork she states that she was stopped cold [place] on her ritalin and she also went several time to get labwork done and both times they dont have an order so she needs to speak with you to find out what is going on and if you going to give her the medication or not.

## 2020-11-20 ENCOUNTER — Other Ambulatory Visit: Payer: Self-pay | Admitting: Psychiatry

## 2020-11-20 DIAGNOSIS — R41840 Attention and concentration deficit: Secondary | ICD-10-CM

## 2020-11-20 NOTE — Telephone Encounter (Signed)
Discussed with the patient.  She reports her frustration of not being able to get UDS, although she went to lab yesterday.  She states that it occurred a few times.  She was informed that the order was expired last month.  She also states that although she called back to the clinic for neuropsychological testing, she does not hear back from them.  She verbalized her understanding that with that and will not be prescribed unless she is done for both UDS/neuropsychological testing do indicate ADHD.  She agrees to try both again.  We will plan to mail you this older.  She agrees to contact the clinic again for neuropsychological testing.  Will contact the clinic to see whether they can try to reach her again.

## 2020-12-06 NOTE — Progress Notes (Signed)
Virtual Visit via Video Note  I connected with Martha [name] on 12/08/20 at  1:00 PM EDT by a video enabled telemedicine application and verified that I am speaking with the correct person using two identifiers.  Location: Patient: home Provider: office Persons participated in the visit- patient, provider    I discussed the limitations of evaluation and management by telemedicine and the availability of in person appointments. The patient expressed understanding and agreed to proceed.   I discussed the assessment and treatment plan with the patient. The patient was provided an opportunity to ask questions and all were answered. The patient agreed with the plan and demonstrated an understanding of the instructions.   The patient was advised to call back or seek an in-person evaluation if the symptoms worsen or if the condition fails to improve as anticipated.  I provided 10 minutes of non-face-to-face time during this encounter.   Martha Hotter, MD   [facility] MD/PA/NP OP Progress Note  12/08/2020 1:26 PM Martha [name]  MRN:  [id number]  Chief Complaint:  Chief Complaint   Follow-up; Depression; Anxiety    HPI:  This is a follow-up appointment for depression.  She states that she was found to have Hashimoto's disease, and will find out more after the blot test.  She graduated in June, and she is trying to get the new job, hopefully at the medical office.  She tends to stay in the house most of the time due to financial strain.  She enjoys playing video games.  She states that she has "okay" relationship with her parents.  Although she tends to feel down, defeated on Sundays regardless of what she does, she tries to push through it.  She has been feeling anxious and tense at times and has occasional panic attacks.  She has difficulty in concentration.  She has an upcoming appointment with her neuropsychologist.   She believes that her mood symptoms are likely stemming from her inability to  focus, and preferred to stay on the current medication regimen.  She denies alcohol use or drug use. She has fair sleep.    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 Older brother lives next to them.  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 She was reportedly diagnosed with ADHD. 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 Mild episode of recurrent major depressive disorder (HCC)  F33.0     2. Insomnia, unspecified type  G47.00       Past Psychiatric History: Please see initial evaluation for full details. I have reviewed the history. No updates at this time.     Past Medical History:  Past Medical History:  Diagnosis Date   ADHD (attention deficit hyperactivity disorder)    no meds taken   Anxiety    Diabetes mellitus    pre-diabetic,told when younger   Family history of breast cancer    12/20 cancer genetic testing letter sent   Headache(784.0)    chronic migraines, decrease since BCP started   Idiopathic intracranial hypertension    Insomnia    Irregular menstrual cycle    pt states she started BCP,unsure of name to help regulate cycle   Obesity    Vision abnormalities  near sighted,wears glasses    Past Surgical History:  Procedure Laterality Date   CHOLECYSTECTOMY     TONSILLECTOMY     at 25yo    Family Psychiatric History: Please see initial evaluation for full details. I have reviewed the history. No updates at this time.     Family History:  Family History  Problem Relation Age of Onset   Thyroid cancer Mother 1540   Depression Mother    Leukemia Paternal Aunt    Breast cancer Paternal Aunt 3040   Skin cancer Maternal Grandfather    Anxiety disorder Father      Social History:  Social History   Socioeconomic History   Marital status: Single    Spouse name: Not on file   Number of children: Not on file   Years of education: Not on file   Highest education level: Not on file  Occupational History   Occupation: Consulting civil engineertudent    Comment: [employer] 12th grade  Tobacco Use   Smoking status: Never   Smokeless tobacco: Never  Vaping Use   Vaping Use: Some days  Substance and Sexual Activity   Alcohol use: Yes    Comment: occ   Drug use: No   Sexual activity: Not Currently    Partners: Male    Birth control/protection: Pill  Other Topics Concern   Not on file  Social History Narrative   Not on file   Social Determinants of Health   Financial Resource Strain: Not on file  Food Insecurity: Not on file  Transportation Needs: Not on file  Physical Activity: Not on file  Stress: Not on file  Social Connections: Not on file    Allergies:  Allergies  Allergen Reactions   Lamisil [Terbinafine]     Metabolic Disorder Labs: Lab Results  Component Value Date   HGBA1C 6.3 (H) 04/20/2020   No results found for: PROLACTIN Lab Results  Component Value Date   CHOL 151 04/20/2020   TRIG 136 04/20/2020   HDL 40 04/20/2020   CHOLHDL 3.8 04/20/2020   LDLCALC 87 04/20/2020   Lab Results  Component Value Date   TSH 6.670 (H) 04/20/2020   TSH 2.405 02/23/2015    Therapeutic Level Labs: No results found for: LITHIUM No results found for: VALPROATE No components found for:  CBMZ  Current Medications: Current Outpatient Medications  Medication Sig Dispense Refill   hydrochlorothiazide (MICROZIDE) 12.5 MG capsule Take 12.5 mg by mouth daily.     aspirin-acetaminophen-caffeine (EXCEDRIN MIGRAINE) 250-250-65 MG per tablet Take 2 tablets by mouth every 6 (six) hours as needed. For migraines.      [START ON 12/17/2020] busPIRone (BUSPAR) 30 MG tablet Take 1 tablet (30 mg total) by mouth at bedtime. 30 tablet 1   cetirizine  (ZYRTEC) 10 MG tablet TK 1 T PO BID     Cholecalciferol (VITAMIN D-1000 MAX ST) 25 MCG (1000 UT) tablet Take by mouth.     [START ON 12/17/2020] desvenlafaxine (PRISTIQ) 100 MG 24 hr tablet Take 1 tablet (100 mg total) by mouth daily. 30 tablet 3   levonorgestrel-ethinyl estradiol (INTROVALE) 0.15-0.03 MG tablet Take 1 tablet by mouth daily. 91 tablet 4   meloxicam (MOBIC) 15 MG tablet TK 1 T PO ONCE A DAY  2   methylphenidate (RITALIN) 10 MG tablet Take 10 mg by mouth daily.     Multiple Vitamin (MULTI-VITAMIN) tablet Take by mouth.     pantoprazole (PROTONIX) 40 MG tablet   11   spironolactone (  ALDACTONE) 100 MG tablet TK 1 T PO QD B NOON  9   vitamin B-12 (CYANOCOBALAMIN) 1000 MCG tablet Take by mouth.     [START ON 12/22/2020] zaleplon (SONATA) 10 MG capsule Take 2 capsules (20 mg total) by mouth at bedtime. 60 capsule 1   No current facility-administered medications for this visit.     Musculoskeletal: Strength & Muscle Tone:  N/A Gait & Station:  N/A Patient leans: N/A  Psychiatric Specialty Exam: Review of Systems  Psychiatric/Behavioral:  Positive for decreased concentration and dysphoric mood. Negative for agitation, behavioral problems, confusion, hallucinations, self-injury, sleep disturbance and suicidal ideas. The patient is nervous/anxious. The patient is not hyperactive.   All other systems reviewed and are negative.  There were no vitals taken for this visit.There is no height or weight on file to calculate BMI.  General Appearance: Fairly Groomed  Eye Contact:  Good  Speech:  Clear and Coherent  Volume:  Normal  Mood:   fine  Affect:  Appropriate, Congruent, and calm, slightly down  Thought Process:  Coherent  Orientation:  Full (Time, Place, and Person)  Thought Content: Logical   Suicidal Thoughts:  No  Homicidal Thoughts:  No  Memory:  Immediate;   Good  Judgement:  Good  Insight:  Fair  Psychomotor Activity:  Normal  Concentration:  Concentration: Good and  Attention Span: Good  Recall:  Good  Fund of Knowledge: Good  Language: Good  Akathisia:  No  Handed:  Right  AIMS (if indicated): not done  Assets:  Communication Skills Desire for Improvement  ADL's:  Intact  Cognition: WNL  Sleep:  Fair   Screenings: GAD-7    Flowsheet Row Office Visit from 04/16/2020 in [facility] Office Visit from 04/15/2019 in [facility]  Total GAD-7 Score 7 10      PHQ2-9    Flowsheet Row Counselor from 11/11/2020 in [facility] Video Visit from 09/09/2020 in [facility] Office Visit from 04/16/2020 in [facility] Office Visit from 04/15/2019 in [facility]  PHQ-2 Total Score 1 2 2 1   PHQ-9 Total Score -- 9 9 10       Flowsheet Row Video Visit from 12/08/2020 in [facility] Counselor from 11/11/2020 in [facility] Counselor from 10/14/2020 in [facility]  C-SSRS RISK CATEGORY No Risk No Risk No Risk        Assessment and Plan:  Martha [name] is a 25 y.o. year old female with a history of depression, anxiety, ADHD by history, migraine, GERD, who presents for follow up appointment for below.    1. Mild episode of recurrent major depressive disorder (HCC) # Anxiety She reports occasional depressive symptoms and an anxiety in the context of stress at work, and financial strain.  Other  psychosocial stressors includes financial strain, conflict with her father, medical condition of her grandmother with Alzheimer disease, who used to be a mother for a year as a child, grief of loss of her grandfather and her aunt.  Although it was recommended to adjust her medication due to her ongoing mood symptoms, she reports preference to stay on the current medication regimen until she is able to restart stimulant for possible ADHD.  Will continue Pristiq to target depression.  We will  continue BuSpar for anxiety.  She will continue to see a therapist.   2. Insomnia, unspecified type She reports fair sleep since the last visit.  Will continue zaleplon as needed for insomnia.    # history of ADHD She was reportedly diagnosed with ADHD and learning disability as a child. She has an upcoming appointment for neuropsych evaluation. Pending UDS, which was reportedly done yesterday.     Plan 1. Continue Pristiq 100 mg daily  2. Continue buspirone 30 mg at night 3. Continue Zaleplon 10 mg at night as needed for sleep 4.  Next appointment: 9/7 at 11 AM, video - She has an upcoming appointment with her neurology for migraine   I have utilized the [place] Controlled Substances Reporting System (PMP AWARxE) to confirm adherence regarding the patient's medication. My review reveals appropriate prescription fills.    Past trials of medication: sertraline, Pristiq, Abilify (visual change)     The patient demonstrates the following risk factors for suicide: Chronic risk factors for suicide include: psychiatric disorder of depression, anxiety. Acute risk factors for suicide include: family or marital conflict. Protective factors for this patient include: positive social support, coping skills and hope for the future. Considering these factors, the overall suicide risk at this point appears to be low. Patient is appropriate for outpatient follow up.       Martha Hotter, MD 12/08/2020, 1:26 PM

## 2020-12-07 ENCOUNTER — Other Ambulatory Visit (HOSPITAL_COMMUNITY): Payer: Self-pay | Admitting: Psychiatry

## 2020-12-08 ENCOUNTER — Telehealth (INDEPENDENT_AMBULATORY_CARE_PROVIDER_SITE_OTHER): Payer: 59 | Admitting: Psychiatry

## 2020-12-08 ENCOUNTER — Other Ambulatory Visit: Payer: Self-pay

## 2020-12-08 ENCOUNTER — Encounter: Payer: Self-pay | Admitting: Psychiatry

## 2020-12-08 DIAGNOSIS — F330 Major depressive disorder, recurrent, mild: Secondary | ICD-10-CM | POA: Diagnosis not present

## 2020-12-08 DIAGNOSIS — G4700 Insomnia, unspecified: Secondary | ICD-10-CM

## 2020-12-08 MED ORDER — ZALEPLON 10 MG PO CAPS: 20.0000 mg | ORAL_CAPSULE | Freq: Every day | ORAL | 1 refills | Status: DC

## 2020-12-08 MED ORDER — DESVENLAFAXINE SUCCINATE ER 100 MG PO TB24: 100.0000 mg | ORAL_TABLET | Freq: Every day | ORAL | 3 refills | Status: DC

## 2020-12-08 MED ORDER — BUSPIRONE HCL 30 MG PO TABS: 30.0000 mg | ORAL_TABLET | Freq: Every day | ORAL | 1 refills | Status: DC

## 2020-12-08 NOTE — Patient Instructions (Signed)
1. Continue Pristiq 100 mg daily  2. Continue buspirone 30 mg at night 3. Continue Zaleplon 10 mg at night as needed for insomnia 4.  Next appointment: 9/7 at 11 AM

## 2020-12-12 LAB — TOXASSURE SELECT 13 (MW), URINE

## 2020-12-16 ENCOUNTER — Encounter: Payer: 59 | Attending: Psychology | Admitting: Psychology

## 2020-12-16 ENCOUNTER — Other Ambulatory Visit: Payer: Self-pay

## 2020-12-16 ENCOUNTER — Encounter: Payer: Self-pay | Admitting: Psychology

## 2020-12-16 DIAGNOSIS — G4700 Insomnia, unspecified: Secondary | ICD-10-CM | POA: Insufficient documentation

## 2020-12-16 DIAGNOSIS — R41840 Attention and concentration deficit: Secondary | ICD-10-CM | POA: Insufficient documentation

## 2020-12-16 DIAGNOSIS — F330 Major depressive disorder, recurrent, mild: Secondary | ICD-10-CM | POA: Diagnosis present

## 2020-12-16 NOTE — Progress Notes (Signed)
Neuropsychological Consultation   Patient:   Martha [name]   DOB:   1996/04/24  MR Number:  [id number]  Location:  [facility] FOR PAIN AND [facility] MEDICINE [facility] PHYSICAL MEDICINE AND REHABILITATION [address] [place], [address] [id number] [facility] [place] [address] Dept: [phone]           Date of Service:   12/16/2020  Start Time:   3 PM End Time:   5 PM  Today's visit was an in person visit was conducted in my outpatient clinic office.  The patient myself were present for this face-to-face clinical interview.  1 hour and 15 minutes was spent in clinical interview and the other 45 minutes were spent in records review, report writing and setting up testing protocols.  Provider/Observer:  Arley Phenix, Psy.D.       Clinical Neuropsychologist       Billing Code/Service: 96116/96121  Chief Complaint:    Martha [name] is a 25 year old female that was referred for neuropsychological evaluation by her treating psychiatrist Neysa Hotter, MD because of ongoing issues of attention and concentration issues in the setting of underlying mood swings, depression, anxiety and recurrent migraines.  The patient has a past medical history including recent diagnosis of Hashimoto's disease with further diagnostic test needed.  The patient has a past medical history of diagnoses of attention deficit hyperactivity disorder, anxiety, diabetes type 2, recurrent headaches, insomnia, and vision abnormalities.  The patient is also having significant issues with dental conditions.  Reason for Service:  Martha [name] is a 25 year old female that was referred for neuropsychological evaluation by her treating psychiatrist Neysa Hotter, MD because of ongoing issues of attention and concentration issues in the setting of underlying mood swings, depression, anxiety and recurrent migraines.  The patient has a past medical history including recent diagnosis of Hashimoto's disease with further  diagnostic test needed.  The patient has a past medical history of diagnoses of attention deficit hyperactivity disorder, anxiety, diabetes type 2, recurrent headaches, insomnia, and vision abnormalities.  The patient is also having significant issues with dental conditions.  The patient has a long history of depression and anxiety symptoms and reports that she was diagnosed with depression and manic depression/bipolar disorder along with panic attacks in the sixth or seventh grade.  The patient was placed on antidepressants at this time.  The patient had a significant depressive event with suicidal ideation and inpatient hospitalization at 25 years of age.  She reports that she has not had another event like this.  She reports that medications for her depression have been helpful.  The patient reports that she also was diagnosed with attention deficit disorder in the first grade and started on psychostimulant medications but does not recall the specific 1.  She continued on this medication until a period during middle school where they were stopped around the times of her depression treatment.  They were restarted in high school where she started taking Ritalin.  Each of these times where she was on psychostimulant medications the patient reports that her academic performance improved.  The patient reports that they recently were stopped with changes in her prescribing physician.  The patient reports that she is having significant issues with attention and concentration issues forgetting appointments and various day-to-day activities.  These are described as having complications with her remembering doctors appointments for some of the medical issues she is having including issues with her eyes as well as her dental difficulties and potential for  tooth loss.  The patient also reports that she has significant disturbance in her sleep patterns and then has become more disturbed recently.    The patient continues  to take BuSpar for her mood and anxiety as well as Pristiq for her depressive symptomatology.  She reports that she is doing well on these but has not been able to continue to take her Ritalin until we complete formal evaluation.  The patient was very emotional about her inability to get her psychostimulant at this time is quite concerned about what impact it is happening on her.  The patient reports worsening insomnia and that she is trying to follow a very strict schedule and trying to go to bed at 9 PM but often she will stay awake until 1 or 2 AM before she falls asleep.  The patient reports that she tries to get up at 9 AM but when she has her worst nights of sleep she sometimes will sleep to as late as 10 or 11 AM before she is able to get up.  The patient reports that there has been a significant change in her appetite.  She reports that she is primarily eating 1 meal a day and only having snacks in between.  The patient is having some concerns around thyroid issues but denies any growth in her thyroid gland at this time and they are still doing tests around Hashimoto's thyroiditis/Hashimoto's disease.  The patient denies any history of significant concussive events or loss of consciousness, denies any history of seizures, motor tics but does acknowledge issues with anxiety and depression primarily depression through the years.  Behavioral Observation: Martha [name]  presents as a 25 y.o.-year-old Right Caucasian Female who appeared her stated age. her dress was Appropriate and she was Well Groomed and her manners were Appropriate to the situation.  her participation was indicative of Appropriate behaviors.  There were not physical disabilities noted.  she displayed an appropriate level of cooperation and motivation.     Interactions:    Active Appropriate and Redirectable  Attention:   abnormal and patient tended to be distracted by internal preoccupations and worries  Memory:   within normal  limits; recent and remote memory intact  Visuo-spatial:  not examined  Speech (Volume):  normal  Speech:   normal; normal  Thought Process:  Coherent and Relevant  Though Content:  WNL; not suicidal and not homicidal  Orientation:   person, place, time/date, and situation  Judgment:   Good  Planning:   Poor  Affect:    Anxious and Tearful  Mood:    Dysphoric  Insight:   Good  Intelligence:   high  Marital Status/Living: The patient was born and raised in [place] and grew up with 1 sibling.  There were some complications at birth noted and the patient weighed 7 pounds 1 ounce at birth.  The patient's only significant medical issues growing up for tonsillitis.  She was told she was very colicky when she was a child.  The patient currently lives with her parents and is single and has no children.  Current Employment: The patient is not currently working but has been looking for a job in the medical field with her recent completion of her associates degree and medical office administration.  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  Substance Use:  No concerns of substance abuse are  reported.  The patient reports only very occasional alcohol use and will have an alcoholic drink on rare occasions and never more than 1.  She denies any other substance use.  Education:   Patient has recently completed her associates degree in medical office administration through [employer].  She maintained a 3.75 GPA.  The patient reports that she is always done well in math and had some difficulties in English/reading and attributes some of those difficulties to her sustained attentional issues.  Medical History:   Past Medical History:  Diagnosis Date   ADHD (attention deficit hyperactivity disorder)    no meds taken   Anxiety    Diabetes mellitus     pre-diabetic,told when younger   Family history of breast cancer    12/20 cancer genetic testing letter sent   Headache(784.0)    chronic migraines, decrease since BCP started   Idiopathic intracranial hypertension    Insomnia    Irregular menstrual cycle    pt states she started BCP,unsure of name to help regulate cycle   Obesity    Vision abnormalities    near sighted,wears glasses        There are no problems to display for this patient.        Psychiatric History:  The patient has a significant prior psychiatric history with diagnosis of ADHD when she was in the first grade and treatment with various psychostimulant medications in elementary school.  In middle school she developed significant depression and was treated with SSRI antidepressants for some time.  She was restarted on psychostimulants in high school including Ritalin.  The patient is also been diagnosed at various times with a mood disorder/manic depression/bipolar disorder as well.  The patient's current psychiatric diagnosis is of a recurrent depressive disorder and the current evaluation is to assess for adult residual attention deficit disorder.  Family Med/Psych History:  Family History  Problem Relation Age of Onset   Thyroid cancer Mother 55   Depression Mother    Leukemia Paternal Aunt    Breast cancer Paternal Aunt 34   Skin cancer Maternal Grandfather    Anxiety disorder Father     Risk of Suicide/Violence: virtually non-existent the patient acknowledges an episode when she was 25 years old where she developed significant suicidal ideation with hospitalization but denies any current suicidal or homicidal ideation.  Impression/DX:  Martha [name] is a 25 year old female that was referred for neuropsychological evaluation by her treating psychiatrist Neysa Hotter, MD because of ongoing issues of attention and concentration issues in the setting of underlying mood swings, depression, anxiety and recurrent  migraines.  The patient has a past medical history including recent diagnosis of Hashimoto's disease with further diagnostic test needed.  The patient has a past medical history of diagnoses of attention deficit hyperactivity disorder, anxiety, diabetes type 2, recurrent headaches, insomnia, and vision abnormalities.  The patient is also having significant issues with dental conditions.  Disposition/Plan:  We set the patient up for formal neuropsychological testing consisting of the comprehensive attention battery and the CPT measures.  Once we have completed this objective assessment a formal report will be produced and provided to her referring physician Dr. Vanetta Shawl, made available in her EMR and feedback will be provided to the patient regarding the results with recommendations going forward.  Diagnosis:    Attention and concentration deficit  Mild episode of recurrent major depressive disorder (HCC)  Insomnia, unspecified type         Electronically  Signed   _______________________ Arley Phenix, Psy.D. Clinical Neuropsychologist

## 2020-12-21 ENCOUNTER — Ambulatory Visit (INDEPENDENT_AMBULATORY_CARE_PROVIDER_SITE_OTHER): Payer: 59 | Admitting: Licensed Clinical Social Worker

## 2020-12-21 ENCOUNTER — Other Ambulatory Visit: Payer: Self-pay

## 2020-12-21 DIAGNOSIS — G4700 Insomnia, unspecified: Secondary | ICD-10-CM | POA: Diagnosis not present

## 2020-12-21 DIAGNOSIS — F330 Major depressive disorder, recurrent, mild: Secondary | ICD-10-CM

## 2020-12-21 DIAGNOSIS — F411 Generalized anxiety disorder: Secondary | ICD-10-CM | POA: Diagnosis not present

## 2020-12-21 NOTE — Progress Notes (Signed)
Virtual Visit via Video Note  I connected with Martha [name] on 12/21/20 at  9:00 AM EDT by a video enabled telemedicine application and verified that I am speaking with the correct person using two identifiers.  Location: Patient: home Provider: remote office [place], [place])   I discussed the limitations of evaluation and management by telemedicine and the availability of in person appointments. The patient expressed understanding and agreed to proceed.  I discussed the assessment and treatment plan with the patient. The patient was provided an opportunity to ask questions and all were answered. The patient agreed with the plan and demonstrated an understanding of the instructions.   The patient was advised to call back or seek an in-person evaluation if the symptoms worsen or if the condition fails to improve as anticipated.  I provided 60 minutes of non-face-to-face time during this encounter.   Quynn Vilchis R Marino Rogerson, LCSW   THERAPIST PROGRESS NOTE  Session Time: 9-10a  Participation Level: Active  Behavioral Response: Neat and Well GroomedAlertAnxious and Depressed  Type of Therapy: Individual Therapy  Treatment Goals addressed: Anxiety and Coping  Interventions: CBT  Summary: Martha [name] is a 25 y.o. female who presents with continuing symptoms related to anxiety diagnosis. Patient reports that she is continuing to have trouble with her focus and concentration. Patient reports that she is having ADHD testing done in two days. Patient reports that overall mood has been stable, and patient feels that she is managing situational anxiety and stress will. Patient reports good quality and quantity of sleep period patient admits that she often goes to bed late, and sleeps late on days that she's not working early. Allowed patient safe space to explore and express thoughts and feelings about recent external stressors. Discussed situations at work that had been triggering for patient.  Allowed patient to explore ways that she can calm down and make her feel less anxious both while at work, and at home. Patient reports that she is not organized--reviewed some organizational skills with patient. Patient reports that she often uses Siri or technology to help her state organized, and reflect on her To Do List. Encourage patient to get a calendar and write down all events, bills do you, etc and review it regularly. Patient reports that she is still continuing to experience irritability, and that she has some difficulty engaging socially with clients at work. Patient just states that she prefers being an introvert. Discuss health related concerns, and patient feels that she has arthritis and thyroid issues. encouraged patient to follow up routinely with medical providers. Reviewed panic management.Continued recommendations are as follows: self care behaviors, positive social engagements, focusing on overall work/home/life balance, and focusing on positive physical and emotional wellness.  .   Suicidal/Homicidal: No  Therapist Response: Patient reports that she is continuing to reduce overall level, frequency, and intensity of anxiety so that daily functioning is not impaired. Patient is able to identify and use specific coping strategies for anxiety reduction. Patient is developing behavioral and cognitive strategies to reduce or eliminate irrational anxiety patterns. Patient is continuing to attempt to reduce irritability and increase normal social interaction with family and friends. Patient is trying to utilize behavioral strategies to overcome depression, and express feelings of hurt, disappointment, shame, and anger that are associated with early life experiences. These behaviors are consistent with personal growth and progress. Treatment to continue as indicated.  Plan: Return again in 4 weeks.  Diagnosis: Axis I: MDD, recurrent, mild, Anxiety  Axis II: No diagnosis    Ernest Haber Maevyn Riordan, LCSW 12/21/2020

## 2020-12-22 DIAGNOSIS — R41840 Attention and concentration deficit: Secondary | ICD-10-CM | POA: Diagnosis not present

## 2020-12-22 NOTE — Progress Notes (Signed)
   Behavioral Observations  The patient appeared well-groomed and appropriately dressed for the testing session. Her manners were polite and appropriate to the situation. The patient seemed hesitant to ask questions about subtests that she didn't understand.    Neuropsychology Note  Kaysa Roulhac Goldwater completed 90 minutes of neuropsychological testing with technician, Marica Otter, BA, under the supervision of Arley Phenix, PsyD., Clinical Neuropsychologist. The patient did not appear overtly distressed by the testing session, per behavioral observation or via self-report to the technician. Rest breaks were offered.   Clinical Decision Making: In considering the patient's current level of functioning, level of presumed impairment, nature of symptoms, emotional and behavioral responses during clinical interview, level of literacy, and observed level of motivation/effort, a battery of tests was selected by Dr. Kieth Brightly during initial consultation on 12/16/20. This was communicated to the technician. Communication between the neuropsychologist and technician was ongoing throughout the testing session and changes were made as deemed necessary based on patient performance on testing, technician observations and additional pertinent factors such as those listed above.  Tests Administered: Comprehensive Attention Battery (CAB) Continuous Performance Test (CPT)   Results: Will be included in final report   Feedback to Patient: CAYLA WIEGAND will return on 05/19/2021 for an interactive feedback session with Dr. Kieth Brightly at which time her test performances, clinical impressions and treatment recommendations will be reviewed in detail. The patient understands she can contact our office should she require our assistance before this time.  60 minutes spent face-to-face with patient administering standardized tests, 30 minutes spent scoring Radiographer, therapeutic). [CPT [id number], [id number]]  Full report to follow.

## 2021-01-25 ENCOUNTER — Ambulatory Visit (INDEPENDENT_AMBULATORY_CARE_PROVIDER_SITE_OTHER): Payer: 59 | Admitting: Licensed Clinical Social Worker

## 2021-01-25 ENCOUNTER — Other Ambulatory Visit: Payer: Self-pay

## 2021-01-25 DIAGNOSIS — F411 Generalized anxiety disorder: Secondary | ICD-10-CM

## 2021-01-25 DIAGNOSIS — F330 Major depressive disorder, recurrent, mild: Secondary | ICD-10-CM

## 2021-01-25 NOTE — Progress Notes (Signed)
Virtual Visit via Video Note  I connected with Martha [name] on 01/25/21 at  1:00 PM EDT by a video enabled telemedicine application and verified that I am speaking with the correct person using two identifiers.  Location: Patient: home Provider: remote office [place], [place])   I discussed the limitations of evaluation and management by telemedicine and the availability of in person appointments. The patient expressed understanding and agreed to proceed.  I discussed the assessment and treatment plan with the patient. The patient was provided an opportunity to ask questions and all were answered. The patient agreed with the plan and demonstrated an understanding of the instructions.   The patient was advised to call back or seek an in-person evaluation if the symptoms worsen or if the condition fails to improve as anticipated.  I provided 45 minutes of non-face-to-face time during this encounter.   Martha Mccarey R Taysha Majewski, LCSW   THERAPIST PROGRESS NOTE  Session Time: 1-1:45p  Participation Level: Active  Behavioral Response: Neat and Well GroomedAlertAnxious  Type of Therapy: Individual Therapy  Treatment Goals addressed: Anxiety  Interventions: CBT and DBT  Summary: Martha [name] is a 25 y.o. female who presents with continuing symptoms related to depression and anxiety. Pt reports that her mood is stable. Pt reports that she is trying hard to manage stress and anxiety well. Martha [name] reports that her sleep patterns are inconsistent. Patient reports she often falls asleep around 1:00 to 2:00 AM and goes to bed at 9:00 to 10:00 PM patient reports that she tries to follow her sleep hygiene, but it's not working. Patient reports better control of panic attack episodes--patient almost had one, but feels like she was in control and managed it and it never manifested into a full blown panic attack. Patient reports that her relationships are good with family members, and that she's looking  forward to upcoming church fall festival. Patient reports her father is cooking BBQ in Environmental health practitioner sauce, so she is excited about being a part of that. 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 Patient reports that sometimes she feels like she has anger inside of her that just needs to be unleashed. Reviewed healthy coping mechanisms for stress. Continued recommendations are as follows: self care behaviors, positive social engagements, focusing on overall work/home/life balance, and focusing on positive physical and emotional wellness.    Suicidal/Homicidal: No  Therapist Response: Patient reports that she is continuing to reduce overall level, frequency, and intensity of anxiety so that daily functioning is not impaired. Patient is able to identify and use specific coping strategies for anxiety reduction. Patient is developing behavioral and cognitive strategies to reduce or eliminate irrational anxiety patterns. Patient is continuing to attempt to reduce irritability and increase normal social interaction with family and friends. Patient is trying to utilize behavioral strategies to overcome depression, and express feelings of hurt, disappointment, shame, and anger that are associated with early life experiences. These behaviors are consistent with personal growth and progress. Treatment to continue as indicated.    Plan: Return again in 4 weeks.  Diagnosis: Axis I: MDD, recurrent; GAD    Axis II: No diagnosis    Martha [name] Martha Markwell, LCSW 01/25/2021

## 2021-01-28 NOTE — Progress Notes (Signed)
Virtual Visit via Video Note  I connected with Martha [name] on 02/03/21 at 11:00 AM EDT by a video enabled telemedicine application and verified that I am speaking with the correct person using two identifiers.  Location: Patient: home Provider: office Persons participated in the visit- patient, provider    I discussed the limitations of evaluation and management by telemedicine and the availability of in person appointments. The patient expressed understanding and agreed to proceed.   I discussed the assessment and treatment plan with the patient. The patient was provided an opportunity to ask questions and all were answered. The patient agreed with the plan and demonstrated an understanding of the instructions.   The patient was advised to call back or seek an in-person evaluation if the symptoms worsen or if the condition fails to improve as anticipated.  I provided 20 minutes of non-face-to-face time during this encounter.   Neysa Hottereina Vittorio Mohs, MD    [facility] OP Progress Note  02/03/2021 12:11 PM Martha [name]  MRN:  [id number]  Chief Complaint:  Chief Complaint   Depression; Follow-up    HPI:  This is a follow-up appointment for depression and insomnia.  She states that she feels agitated lately.  She feels stressed at work.  She was told by others that she is incompetent.  She had "meltdown" at work the other day.  She felt she had attack after an attack.  She had to go to back and cried.  She feels always on the verge of panic attacks especially at work.  She also talks about loss of her cat, who she was with for 20 years. She felt good when she took care of kitten.  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  She has not had any luck to get interview yet.  Although she misses people who she lost, she thinks time will heal this. She has depressive symptoms as in PHQ-9.  She denies SI, HI.  She is willing to try bupropion at this time.  She continues to have insomnia.     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 Older brother lives next to them.  Marital status: single Number of children: 0 Education: [employer], will be graduating in June, majoring in medical office administration. She was reportedly diagnosed with ADHD. 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 Moderate episode of recurrent major depressive disorder (HCC)  F33.1     2. Insomnia, unspecified type  G47.00       Past Psychiatric History: Please see initial evaluation for full details. I have reviewed the history. No updates at this time.     Past Medical History:  Past Medical History:  Diagnosis Date   ADHD (attention deficit hyperactivity disorder)    no meds taken   Anxiety    Diabetes mellitus    pre-diabetic,told when younger   Family history of breast cancer    12/20 cancer genetic testing letter sent   Headache(784.0)    chronic migraines, decrease since BCP started   Idiopathic intracranial hypertension    Insomnia    Irregular menstrual cycle    pt states she started BCP,unsure of name to help regulate cycle   Obesity    Vision abnormalities    near sighted,wears glasses    Past  Surgical History:  Procedure Laterality Date   CHOLECYSTECTOMY     TONSILLECTOMY     at 25yo    Family Psychiatric History: Please see initial evaluation for full details. I have reviewed the history. No updates at this time.     Family History:  Family History  Problem Relation Age of Onset   Thyroid cancer Mother 9   Depression Mother    Leukemia Paternal Aunt    Breast cancer Paternal Aunt 53   Skin cancer Maternal Grandfather    Anxiety disorder Father     Social History:  Social History   Socioeconomic History   Marital status: Single    Spouse name: Not on file    Number of children: Not on file   Years of education: Not on file   Highest education level: Not on file  Occupational History   Occupation: Consulting civil engineer    Comment: [employer] 12th grade  Tobacco Use   Smoking status: Never   Smokeless tobacco: Never  Vaping Use   Vaping Use: Some days  Substance and Sexual Activity   Alcohol use: Yes    Comment: occ   Drug use: No   Sexual activity: Not Currently    Partners: Male    Birth control/protection: Pill  Other Topics Concern   Not on file  Social History Narrative   Not on file   Social Determinants of Health   Financial Resource Strain: Not on file  Food Insecurity: Not on file  Transportation Needs: Not on file  Physical Activity: Not on file  Stress: Not on file  Social Connections: Not on file    Allergies:  Allergies  Allergen Reactions   Lamisil [Terbinafine]     Metabolic Disorder Labs: Lab Results  Component Value Date   HGBA1C 6.3 (H) 04/20/2020   No results found for: PROLACTIN Lab Results  Component Value Date   CHOL 151 04/20/2020   TRIG 136 04/20/2020   HDL 40 04/20/2020   CHOLHDL 3.8 04/20/2020   LDLCALC 87 04/20/2020   Lab Results  Component Value Date   TSH 6.670 (H) 04/20/2020   TSH 2.405 02/23/2015    Therapeutic Level Labs: No results found for: LITHIUM No results found for: VALPROATE No components found for:  CBMZ  Current Medications: Current Outpatient Medications  Medication Sig Dispense Refill   buPROPion (WELLBUTRIN XL) 150 MG 24 hr tablet Take 1 tablet (150 mg total) by mouth daily. 30 tablet 1   aspirin-acetaminophen-caffeine (EXCEDRIN MIGRAINE) 250-250-65 MG per tablet Take 2 tablets by mouth every 6 (six) hours as needed. For migraines.      [START ON 02/16/2021] busPIRone (BUSPAR) 30 MG tablet Take 1 tablet (30 mg total) by mouth at bedtime. 30 tablet 3   cetirizine (ZYRTEC) 10 MG tablet TK 1 T PO BID     Cholecalciferol (VITAMIN D-1000 MAX ST) 25 MCG (1000 UT) tablet  Take by mouth.     desvenlafaxine (PRISTIQ) 100 MG 24 hr tablet Take 1 tablet (100 mg total) by mouth daily. 30 tablet 3   hydrochlorothiazide (MICROZIDE) 12.5 MG capsule Take 12.5 mg by mouth daily.     levonorgestrel-ethinyl estradiol (INTROVALE) 0.15-0.03 MG tablet Take 1 tablet by mouth daily. 91 tablet 4   meloxicam (MOBIC) 15 MG tablet TK 1 T PO ONCE A DAY  2   methylphenidate (RITALIN) 10 MG tablet Take 10 mg by mouth daily.     Multiple Vitamin (MULTI-VITAMIN) tablet Take by mouth.  pantoprazole (PROTONIX) 40 MG tablet   11   spironolactone (ALDACTONE) 100 MG tablet TK 1 T PO QD B NOON  9   vitamin B-12 (CYANOCOBALAMIN) 1000 MCG tablet Take by mouth.     [START ON 02/15/2021] zaleplon (SONATA) 10 MG capsule Take 2 capsules (20 mg total) by mouth at bedtime. 60 capsule 3   No current facility-administered medications for this visit.     Musculoskeletal: Strength & Muscle Tone:  N/A Gait & Station:  N/A Patient leans: N/A  Psychiatric Specialty Exam: Review of Systems  Psychiatric/Behavioral:  Positive for decreased concentration, dysphoric mood and sleep disturbance. Negative for agitation, behavioral problems, confusion, hallucinations, self-injury and suicidal ideas. The patient is nervous/anxious. The patient is not hyperactive.   All other systems reviewed and are negative.  There were no vitals taken for this visit.There is no height or weight on file to calculate BMI.  General Appearance: Fairly Groomed  Eye Contact:  Good  Speech:  Clear and Coherent  Volume:  Normal  Mood:   "agitated"  Affect:  Appropriate, Congruent, Tearful, and down at times  Thought Process:  Coherent  Orientation:  Full (Time, Place, and Person)  Thought Content: Logical   Suicidal Thoughts:  No  Homicidal Thoughts:  No  Memory:  Immediate;   Good  Judgement:  Good  Insight:  Good  Psychomotor Activity:  Normal  Concentration:  Concentration: Good and Attention Span: Good  Recall:   Good  Fund of Knowledge: Good  Language: Good  Akathisia:  No  Handed:  Right  AIMS (if indicated): not done  Assets:  Communication Skills Desire for Improvement  ADL's:  Intact  Cognition: WNL  Sleep:  Poor   Screenings: GAD-7    Flowsheet Row Office Visit from 04/16/2020 in [facility] Office Visit from 04/15/2019 in [facility]  Total GAD-7 Score 7 10      PHQ2-9    Flowsheet Row Video Visit from 02/03/2021 in [facility] Counselor from 01/25/2021 in [facility] Counselor from 11/11/2020 in [facility] Video Visit from 09/09/2020 in [facility] Office Visit from 04/16/2020 in [facility]  PHQ-2 Total Score 2 0 1 2 2   PHQ-9 Total Score 12 -- -- 9 9      Flowsheet Row Video Visit from 02/03/2021 in [facility] Counselor from 01/25/2021 in [facility] Video Visit from 12/08/2020 in [facility]  C-SSRS RISK CATEGORY No Risk No Risk No Risk        Assessment and Plan:  Martha [name] is a 25 y.o. year old female with a history of  depression, anxiety, ADHD by history, migraine, GERD, who presents for follow up appointment for below.   1. Moderate episode of recurrent major depressive disorder (HCC) There has been slight worsening in depressive symptoms and irritability since the last visit.  Psychosocial stressors includes work, and loss of friends and mother, grandfather, aunt.  Other  psychosocial stressors includes financial strain, conflict with her father, medical condition of her grandmother with Alzheimer disease, who used to be a mother for a year as a child.  Will start bupropion to optimize treatment for depression.  Discussed potential risk of headache.  She has no known history of seizure.  Will continue Pristiq to target depression.  Will  continue BuSpar for anxiety.  She will continue to see a therapist.   2. Insomnia, unspecified type  She reports fair benefit from zaleplon.  Will continue current dose to target insomnia.  Noted that she has pending sleep evaluation after payment is done .  # History of ADHD She was seen by [facility] psychologist, and the result is pending.  She was reportedly diagnosed with ADHD and learning disability as a child.  Will await further results, and treat accordingly.   Plan 1. Continue Pristiq 100 mg daily  2. Start bupropion 150 mg daily 3. Continue buspirone 30 mg at night 4. Continue Zaleplon 10 mg at night as needed for sleep 5.  Next appointment: 10/27 at 11:30, video - She has an upcoming appointment with her neurology for migraine    I have utilized the [place] Controlled Substances Reporting System (PMP AWARxE) to confirm adherence regarding the patient's medication. My review reveals appropriate prescription fills.     Past trials of medication: sertraline, Pristiq, Abilify (visual change)     The patient demonstrates the following risk factors for suicide: Chronic risk factors for suicide include: psychiatric disorder of depression, anxiety. Acute risk factors for suicide include: family or marital conflict. Protective factors for this patient include: positive social support, coping skills and hope for the future. Considering these factors, the overall suicide risk at this point appears to be low. Patient is appropriate for outpatient follow up.  Neysa Hotter, MD 02/03/2021, 12:11 PM

## 2021-02-03 ENCOUNTER — Other Ambulatory Visit: Payer: Self-pay

## 2021-02-03 ENCOUNTER — Encounter: Payer: Self-pay | Admitting: Psychiatry

## 2021-02-03 ENCOUNTER — Telehealth (INDEPENDENT_AMBULATORY_CARE_PROVIDER_SITE_OTHER): Payer: 59 | Admitting: Psychiatry

## 2021-02-03 DIAGNOSIS — G4700 Insomnia, unspecified: Secondary | ICD-10-CM | POA: Diagnosis not present

## 2021-02-03 DIAGNOSIS — F331 Major depressive disorder, recurrent, moderate: Secondary | ICD-10-CM | POA: Diagnosis not present

## 2021-02-03 MED ORDER — BUSPIRONE HCL 30 MG PO TABS: 30.0000 mg | ORAL_TABLET | Freq: Every day | ORAL | 3 refills | Status: DC

## 2021-02-03 MED ORDER — ZALEPLON 10 MG PO CAPS: 20.0000 mg | ORAL_CAPSULE | Freq: Every day | ORAL | 3 refills | Status: DC

## 2021-02-03 MED ORDER — BUPROPION HCL ER (XL) 150 MG PO TB24: 150.0000 mg | ORAL_TABLET | Freq: Every day | ORAL | 1 refills | Status: DC

## 2021-02-03 NOTE — Patient Instructions (Signed)
1. Continue Pristiq 100 mg daily  2. Start bupropion 150 mg daily 3. Continue buspirone 30 mg at night 4. Continue Zaleplon 10 mg at night as needed for sleep 5.  Next appointment: 10/27 at 11:30

## 2021-02-04 ENCOUNTER — Other Ambulatory Visit: Payer: Self-pay | Admitting: Psychiatry

## 2021-02-04 ENCOUNTER — Telehealth: Payer: Self-pay

## 2021-02-04 DIAGNOSIS — F3341 Major depressive disorder, recurrent, in partial remission: Secondary | ICD-10-CM | POA: Insufficient documentation

## 2021-02-04 DIAGNOSIS — R600 Localized edema: Secondary | ICD-10-CM | POA: Insufficient documentation

## 2021-02-04 MED ORDER — QUETIAPINE FUMARATE 25 MG PO TABS: 25.0000 mg | ORAL_TABLET | Freq: Every day | ORAL | 1 refills | Status: DC

## 2021-02-04 NOTE — Telephone Encounter (Signed)
pt called states that she can not take the wellbutrin she did not know that was what you was going to put her on or she would have told you that she couldn't take it.

## 2021-02-04 NOTE — Telephone Encounter (Signed)
Could you contact the pharmacy and cancel bupropion order? The following was discussed with the patient.   She states that she had SI when she was on bupropion when she was young.  She is willing to try quetiapine.  Discussed potential risk of metabolic side effect, EPS and drowsiness.  - Start quetiapine 25 mg at night

## 2021-02-10 ENCOUNTER — Encounter: Payer: Self-pay | Admitting: Psychology

## 2021-02-10 ENCOUNTER — Other Ambulatory Visit: Payer: Self-pay

## 2021-02-10 ENCOUNTER — Encounter: Payer: 59 | Attending: Psychology | Admitting: Psychology

## 2021-02-10 DIAGNOSIS — F908 Attention-deficit hyperactivity disorder, other type: Secondary | ICD-10-CM | POA: Diagnosis present

## 2021-02-10 DIAGNOSIS — G4700 Insomnia, unspecified: Secondary | ICD-10-CM | POA: Insufficient documentation

## 2021-02-10 DIAGNOSIS — F330 Major depressive disorder, recurrent, mild: Secondary | ICD-10-CM | POA: Diagnosis present

## 2021-02-10 NOTE — Progress Notes (Signed)
Neuropsychological Evaluation   Patient:  Martha [name]   DOB: 1996/04/24  MR Number: [id number]  Location: [facility]IC[facility] REHABILITATION [address] [place], [address] [id number] [facility] [place] [address] Dept: [phone]  Start: 4 PM End: 5 PM  Provider/Observer:     Hershal Coria PsyD  Chief Complaint:      Chief Complaint  Patient presents with   ADHD   Anxiety   Depression    Reason For Service:      Martha [name] is a 25 year old female that was referred for neuropsychological evaluation by her treating psychiatrist Neysa Hotter, MD because of ongoing issues of attention and concentration issues in the setting of underlying mood swings, depression, anxiety and recurrent migraines.  The patient has a past medical history including recent diagnosis of Hashimoto's disease with further diagnostic test needed.  The patient has a past medical history of diagnoses of attention deficit hyperactivity disorder, anxiety, diabetes type 2, recurrent headaches, insomnia, and vision abnormalities.  The patient is also having significant issues with dental conditions.  The patient has a long history of depression and anxiety symptoms and reports that she was diagnosed with depression and manic depression/bipolar disorder along with panic attacks in the sixth or seventh grade.  The patient was placed on antidepressants at this time.  The patient had a significant depressive event with suicidal ideation and inpatient hospitalization at 25 years of age.  She reports that she has not had another event like this.  She reports that medications for her depression have been helpful.  The patient reports that she also was diagnosed with attention deficit disorder in the first grade and started on psychostimulant medications but does not recall the specific 1.  She continued on this medication until a period during middle school  where they were stopped around the times of her depression treatment.  They were restarted in high school where she started taking Ritalin.  Each of these times where she was on psychostimulant medications the patient reports that her academic performance improved.  The patient reports that they recently were stopped with changes in her prescribing physician.  The patient reports that she is having significant issues with attention and concentration issues forgetting appointments and various day-to-day activities.  These are described as having complications with her remembering doctors appointments for some of the medical issues she is having including issues with her eyes as well as her dental difficulties and potential for tooth loss.  The patient also reports that she has significant disturbance in her sleep patterns and then has become more disturbed recently.     The patient continues to take BuSpar for her mood and anxiety as well as Pristiq for her depressive symptomatology.  She reports that she is doing well on these but has not been able to continue to take her Ritalin until we complete formal evaluation.  The patient was very emotional about her inability to get her psychostimulant at this time is quite concerned about what impact it is happening on her.  The patient reports worsening insomnia and that she is trying to follow a very strict schedule and trying to go to bed at 9 PM but often she will stay awake until 1 or 2 AM before she falls asleep.  The patient reports that she tries to get up at 9 AM but when she has her worst nights of sleep she sometimes will sleep to  as late as 10 or 11 AM before she is able to get up.  The patient reports that there has been a significant change in her appetite.  She reports that she is primarily eating 1 meal a day and only having snacks in between.  The patient is having some concerns around thyroid issues but denies any growth in her thyroid gland at this  time and they are still doing tests around Hashimoto's thyroiditis/Hashimoto's disease.  The patient denies any history of significant concussive events or loss of consciousness, denies any history of seizures, motor tics but does acknowledge issues with anxiety and depression primarily depression through the years.  Tests Administered: Comprehensive Attention Battery (CAB) Continuous Performance Test (CPT)  Participation Level:   Active  Participation Quality:  Appropriate      Behavioral Observation:  The patient appeared well-groomed and appropriately dressed for the testing session. Her manners were polite and appropriate to the situation. The patient seemed hesitant to ask questions about subtests that she didn't understand.   Well Groomed, Alert, and Appropriate.   Test Results:   Is part of the evaluation to help with differential diagnosis and treatment planning the patient was administered the comprehensive attention battery as well as the CAB CPT measures.  This does appear to be a valid assessment and the patient appeared to approach these measures and appropriate straightforward manner neither attempting to exaggerate or minimize any current clinical symptomatology.  The patient was alert and active throughout the testing session.  This does appear to be a valid assessment.  Initially, the patient was administered the auditory visual reaction time measures which are pure reaction time test.  On the visual pure reaction time measure the patient correctly identified 47 of 50 targets with 3 errors of omission.  On the auditory pure reaction time measure she correctly identified 50 of 50 targets with no errors of omission.  The patient's response time was within normal limits.  This pattern suggest that overall arousal levels were appropriate and that she was giving good effort without indications that she was attempting to manipulate testing procedures.  The patient was then administered  the discriminate reaction time measures.  On the visual discriminate reaction time measure the patient correctly identified 34 of 35 targets with 4 errors of commission and 1 error of omission.  Average response time was 467 ms.  Both the accuracy scores as well as response time scores were well within normative expectations but the patient did show an elevation in impulsive responding and did indicate patterns consistent with difficulty inhibiting inappropriate responses.  On the auditory discriminate reaction time measure the patient correctly responded to 34 of 35 targets with 3 errors of commission and only 1 error of omission.  Her average response time was 662 ms.  This response time was within normative expectation.  She did have a clinical elevation in errors of commission again displaying a consistent pattern of difficulties inhibiting impulsive responses.  On the shift discriminate reaction time measure the patient correctly responded to 24 of 30 targets with 7 errors of commission and 6 errors of omission.  The patient did have 1 loss that accounting for the 6 errors of omission.  The patient's error of commission were significantly elevated further highlighting difficulty with impulsive responding and difficulty inhibiting inappropriate responses.  The patient was then administered the auditory visual scan measures.  On the visual, auditory and mixed portions the patient showed consistent responses without significant errors throughout.  Her  average response times were all within normal limits.  The patient showed a good ability to shift attention and maintain set throughout.  The patient was then administered the auditory/visual encoding test.  On the auditory encoding measures the patient showed significant deficits effectively encoding new information.  The patient was more than 1 standard deviation below normative expectations with regard to auditory encoding.  On the visual encoding test the  patient did much better and performed within normal limits both on visual forwards and visual backwards encoding measures.  There were significant difference between auditory versus visual encoding.  The patient was then administered the Stroop interference cancellation test.  This is a successive test where the first 4 series are 9 interference trials.  The patient performs a focus execute task assessing visual scanning and visual searching components without targeted interference.  However, these measures are quickly shifted into interference trials for 4 trials.  On the first 4 9 interference trials the patient did quite well performing roughly 1 standard deviation above normative expectations consistently across all 4 series.  When this task was shifted to an interference trial the patient showed only mild initial decrease in performance under interference but quickly regained performance and actually outperformed on the interference trials her performance from 9 interference trials.  There were no indications of particular vulnerability to external distractors.  Finally, the patient was administered the visual monitor CPT test.  This is a 15-minute task that is broken down into five 3-minute blocks of time for analysis.  On the first 3 minutes of the test the patient correctly identified 29 of 30 targets with only 1 error of omission.  However, the patient had 13 errors of commission which is similar to patterns seen on shorter discriminate reaction time measures administered earlier.  Her average response time was quite good for correct responses of 338 ms.  This pattern continued to show the exact same sequence over time.  While her average response time slowed considerably for correct response that she also continued to display a significant amount of errors of commission across the 15-minute task.  On the last 3 minutes of this 15-minute test she correctly identified 26 of 30 targets but had 10 errors  of commission along with only 4 errors of omission.  Her average response time was 527 ms which is at 200 ms increase in response times.  Overall, this continuous performance measure highlights other testing displaying difficulties with impulsive responding and inability to inhibit inappropriate responses as well as a significant reduction in information processing speed as a function of time on a continuous performance test.  Her average response time decreased by nearly 200 ms or roughly a 40% increase in response time over this 15-minute test.  This also suggest not only impulsive responding but difficulty sustaining attention as a function of time.  Summary of Results:   The objective results of the current neuropsychological evaluation show a very consistent pattern across the wide range of attentional and executive functioning measures.  The patient showed good information processing speed in general (focus execute task) as well as very good visual encoding capacity throughout.  The patient appeared to give full effort but showed significant and repeated evidence of impulsive responding and difficulty inhibiting inappropriate responses, impairments to auditory encoding capacity suggesting some auditory processing difficulties, as well as impairments with regard to sustained attention.  The patient was quite good at avoiding external distractors and staying focused on the task and highly distractive setting  but was quite impulsive and had difficulty sustaining attention as a function of time.  Impression/Diagnosis:   As far as the objective neuropsychological evaluation taking into account the patient's subjective reports of symptoms, is still working clinical information available as well as objective findings utilizing the comprehensive attention battery the patient does appear to have patterns consistent with attention deficit disorder.  While typically someone with clear symptoms of the degree of  anxiety, depression and mood disturbance would rule out a coexisting diagnosis of adult residual attention deficit disorder in this case her pattern of strengths and weaknesses on a wide range of attention and concentration measures are quite consistent with those patterns typically seen with ADHD and also showing some patterns consistent with depression (auditory encoding deficits).  While fairly unusual I do think that the patient does show clear indications of adult residual attention deficit disorder and a history of these attentional deficits dating back to the start of elementary school.  The patient also has a clear mood disturbance including clinical depression and anxiety disorder as well as panic disorder as well but her attentional deficits appear to be persistent regardless of what mood state she is in and she reports an has displayed positive response to psychotropic medications related to attentional deficits.  However, her depression, anxiety and panic events in the past do create a complicated psychiatric picture and careful consideration of psychotropic medications is warranted.  While the patient's clinical picture on objective neuropsychological test do suggest that she would be a good responder to psychostimulant medications particularly issues related to deficits and sustaining attention and impulsive responding there is always a risk that it could exaggerate her anxiety and potentially create a pattern prone to increasing panic attacks.  This should be closely monitored.  Is also very important that the patient should work very diligently on improved sleep hygiene and working on improving her sleep pattern.  Of all the behavioral changes that the patient could make efforts to improve sleep efficiency and sleep hygiene are likely potentially the most useful and helpful thing she can do around her depression, anxiety and attentional deficits.  I will sit down with the patient and go over the  results of the current neuropsychological evaluation as well as make it available to her treating psychiatrist Dr. Vanetta Shawl.  Diagnosis:    Adult residual type attention deficit hyperactivity disorder (ADHD)  Mild episode of recurrent major depressive disorder (HCC)  Insomnia, unspecified type   _____________________ Arley Phenix, Psy.D. Clinical Neuropsychologist

## 2021-02-16 ENCOUNTER — Other Ambulatory Visit: Payer: Self-pay

## 2021-02-16 ENCOUNTER — Telehealth: Payer: Self-pay

## 2021-02-16 ENCOUNTER — Encounter (HOSPITAL_BASED_OUTPATIENT_CLINIC_OR_DEPARTMENT_OTHER): Payer: 59 | Admitting: Psychology

## 2021-02-16 DIAGNOSIS — F908 Attention-deficit hyperactivity disorder, other type: Secondary | ICD-10-CM | POA: Diagnosis not present

## 2021-02-16 DIAGNOSIS — G4700 Insomnia, unspecified: Secondary | ICD-10-CM

## 2021-02-16 DIAGNOSIS — F330 Major depressive disorder, recurrent, mild: Secondary | ICD-10-CM

## 2021-02-16 MED ORDER — ZALEPLON 10 MG PO CAPS: 20.0000 mg | ORAL_CAPSULE | Freq: Every day | ORAL | 0 refills | Status: DC

## 2021-02-16 NOTE — Telephone Encounter (Signed)
pt called states that the pharmacy will not fill her sonata because it was not e-signed.

## 2021-02-21 NOTE — Progress Notes (Signed)
02/16/2021: 4 PM-5 PM  Today's visit was an in person visit that was conducted in my outpatient clinic office with the patient myself present.  Today we reviewed the results of the recent neuropsychological evaluation.  I have included the summary and results from the formal neuropsychological evaluation that can be found in the patient's EMR in its entirety dated 01/13/2021.:  Summary of Results:                        The objective results of the current neuropsychological evaluation show a very consistent pattern across the wide range of attentional and executive functioning measures.  The patient showed good information processing speed in general (focus execute task) as well as very good visual encoding capacity throughout.  The patient appeared to give full effort but showed significant and repeated evidence of impulsive responding and difficulty inhibiting inappropriate responses, impairments to auditory encoding capacity suggesting some auditory processing difficulties, as well as impairments with regard to sustained attention.  The patient was quite good at avoiding external distractors and staying focused on the task and highly distractive setting but was quite impulsive and had difficulty sustaining attention as a function of time.   Impression/Diagnosis:                     As far as the objective neuropsychological evaluation taking into account the patient's subjective reports of symptoms, is still working clinical information available as well as objective findings utilizing the comprehensive attention battery the patient does appear to have patterns consistent with attention deficit disorder.  While typically someone with clear symptoms of the degree of anxiety, depression and mood disturbance would rule out a coexisting diagnosis of adult residual attention deficit disorder in this case her pattern of strengths and weaknesses on a wide range of attention and concentration measures are quite  consistent with those patterns typically seen with ADHD and also showing some patterns consistent with depression (auditory encoding deficits).  While fairly unusual I do think that the patient does show clear indications of adult residual attention deficit disorder and a history of these attentional deficits dating back to the start of elementary school.  The patient also has a clear mood disturbance including clinical depression and anxiety disorder as well as panic disorder as well but her attentional deficits appear to be persistent regardless of what mood state she is in and she reports an has displayed positive response to psychotropic medications related to attentional deficits.  However, her depression, anxiety and panic events in the past do create a complicated psychiatric picture and careful consideration of psychotropic medications is warranted.  While the patient's clinical picture on objective neuropsychological test do suggest that she would be a good responder to psychostimulant medications particularly issues related to deficits and sustaining attention and impulsive responding there is always a risk that it could exaggerate her anxiety and potentially create a pattern prone to increasing panic attacks.  This should be closely monitored.  Is also very important that the patient should work very diligently on improved sleep hygiene and working on improving her sleep pattern.  Of all the behavioral changes that the patient could make efforts to improve sleep efficiency and sleep hygiene are likely potentially the most useful and helpful thing she can do around her depression, anxiety and attentional deficits.  I will sit down with the patient and go over the results of the current neuropsychological evaluation as well as make  it available to her treating psychiatrist Dr. Vanetta Shawl.   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 Clinical Neuropsychologist

## 2021-03-01 ENCOUNTER — Other Ambulatory Visit: Payer: Self-pay

## 2021-03-01 ENCOUNTER — Ambulatory Visit (INDEPENDENT_AMBULATORY_CARE_PROVIDER_SITE_OTHER): Payer: 59 | Admitting: Licensed Clinical Social Worker

## 2021-03-01 DIAGNOSIS — F331 Major depressive disorder, recurrent, moderate: Secondary | ICD-10-CM | POA: Diagnosis not present

## 2021-03-01 NOTE — Plan of Care (Signed)
  Problem: Decrease depressive symptoms and improve levels of effective functioning Goal: LTG: Reduce frequency, intensity, and duration of depression symptoms as evidenced by: SSB input needed on appropriate metric Outcome: Progressing Goal: STG: [name] WILL PARTICIPATE IN AT LEAST 80% OF SCHEDULED INDIVIDUAL PSYCHOTHERAPY SESSIONS Outcome: Progressing Intervention: WORK WITH Martha [name] TO IDENTIFY THEIR 3 PERSONAL GOALS FOR MANAGING DEPRESSION SYMPTOMS AND ADD TO THIS PLAN Intervention: Support useful positive self-talk Intervention: Encourage new environment or opportunities for social interaction Intervention: Assess emotional status and coping mechanisms

## 2021-03-01 NOTE — Progress Notes (Signed)
Virtual Visit via Video Note  I connected with Martha [name] on 03/01/21 at 10:00 AM EDT by a video enabled telemedicine application and verified that I am speaking with the correct person using two identifiers.  Location: Patient: home Provider: remote office [place], [place])   I discussed the limitations of evaluation and management by telemedicine and the availability of in person appointments. The patient expressed understanding and agreed to proceed.   I discussed the assessment and treatment plan with the patient. The patient was provided an opportunity to ask questions and all were answered. The patient agreed with the plan and demonstrated an understanding of the instructions.   The patient was advised to call back or seek an in-person evaluation if the symptoms worsen or if the condition fails to improve as anticipated.  I provided 35 minutes of non-face-to-face time during this encounter.   Martha [name] R [name], LCSW   THERAPIST PROGRESS NOTE  Session Time: 10-10:35a  Participation Level: Active  Behavioral Response: Neat and Well GroomedAlertEuthymic  Type of Therapy: Individual Therapy  Treatment Goals addressed:  Goal: LTG: Reduce frequency, intensity, and duration of depression symptoms as evidenced by: SSB input needed on appropriate metric Outcome: Progressing  Goal: STG: Tagen WILL PARTICIPATE IN AT LEAST 80% OF SCHEDULED INDIVIDUAL PSYCHOTHERAPY SESSIONS Outcome: Progressing  Interventions:    Intervention: WORK WITH Landon TO IDENTIFY THEIR 3 PERSONAL GOALS FOR MANAGING DEPRESSION SYMPTOMS AND ADD TO THIS PLAN  Intervention: Support useful positive self-talk  Intervention: Encourage new environment or opportunities for social interaction  Intervention: Assess emotional status and coping mechanisms   Summary: Martha [name] is a 25 y.o. female who presents with improving symptoms related to depression diagnosis. Pt reports that mood is stable and that  sleep is inconsistent, at times.   Allowed pt to explore and express thoughts and feelings associated with recent life situations and external stressors. Discussed relationships with family members, work related stressors, recent job interview.   Continued recommendations are as follows: self care behaviors, positive social engagements, focusing on overall work/home/life balance, and focusing on positive physical and emotional wellness.   Suicidal/Homicidal: No  Therapist Response: Pt is continuing to apply interventions learned in session into daily life situations. Pt is currently on track to meet goals utilizing interventions mentioned above. Personal growth and progress noted. Treatment to continue as indicated.   Plan: Return again in 4 weeks.  Diagnosis: Axis I: MDD, recurrent, mild    Axis II: No diagnosis    Martha Haber Makenlee Mckeag, LCSW 03/01/2021

## 2021-03-11 ENCOUNTER — Telehealth: Payer: Self-pay

## 2021-03-11 ENCOUNTER — Other Ambulatory Visit: Payer: Self-pay | Admitting: Psychiatry

## 2021-03-11 MED ORDER — ZALEPLON 10 MG PO CAPS: 20.0000 mg | ORAL_CAPSULE | Freq: Every day | ORAL | 0 refills | Status: DC

## 2021-03-11 NOTE — Telephone Encounter (Signed)
Ordered

## 2021-03-11 NOTE — Telephone Encounter (Signed)
pt called states she will not have enought of the zaleplon to get to the next appt

## 2021-03-17 ENCOUNTER — Telehealth: Payer: Self-pay

## 2021-03-17 NOTE — Telephone Encounter (Signed)
received fax that a prior auth was needed for the zaleplon

## 2021-03-17 NOTE — Telephone Encounter (Signed)
went online and submitted the prior auth - pending ?

## 2021-03-25 ENCOUNTER — Telehealth: Payer: 59 | Admitting: Psychiatry

## 2021-03-29 NOTE — Progress Notes (Addendum)
Virtual Visit via Video Note  I connected with Martha [name] on 03/30/21 at  3:30 PM EDT by a video enabled telemedicine application and verified that I am speaking with the correct person using two identifiers.  Location: Patient: home Provider: office Persons participated in the visit- patient, provider    I discussed the limitations of evaluation and management by telemedicine and the availability of in person appointments. The patient expressed understanding and agreed to proceed.     I discussed the assessment and treatment plan with the patient. The patient was provided an opportunity to ask questions and all were answered. The patient agreed with the plan and demonstrated an understanding of the instructions.   The patient was advised to call back or seek an in-person evaluation if the symptoms worsen or if the condition fails to improve as anticipated.  I provided 16 minutes of non-face-to-face time during this encounter.   Neysa Hotter, MD    [facility] MD/PA/NP OP Progress Note  03/30/2021 4:27 PM Martha [name]  MRN:  [id number]  Chief Complaint:  Chief Complaint   Depression; Follow-up    HPI:  This is a follow-up appointment for depression and anxiety.  She talks about her frustration of her leaving several voice messages, and did not hear back from the staff.  This clinician informed the patient that this will be discussed with the staff to explore what happened.  She could not get zaleplon due to issues with prior authorization.  She is now able to get this back.  She quit the job, and the last day will be tomorrow.  Although it has been busy due to upcoming holiday, she does not care about it anymore now that she will quit.  She is waiting to hear from [facility] for a job.  She is also taking care of things at house.  Her parents are in 18s, and has issues with pain.  She has not been able to see her grandmother for the past few months as the unit is locked down due to  COVID.  Although she feels depressed at times, it has been the same.  She feels fatigue, which she also attributes to her medical condition.  She denies change in appetite.  She feels anxious and tense at times.  She denies SI. She denies any side effect from quetiapine, and has not noticed much difference since starting this medication.  She has difficulty in concentration; doing multi tasks, and she is forgetful.  She would like to be back on Ritalin.   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 Older brother lives next to them.  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 She was reportedly diagnosed with ADHD. 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 Moderate episode of recurrent major depressive disorder (HCC)  F33.1     2. Insomnia, unspecified type  G47.00     3. Attention deficit hyperactivity disorder (ADHD), unspecified ADHD type  F90.9       Past Psychiatric History: Please see initial evaluation for full details. I have reviewed the history. No updates at this time.     Past Medical History:  Past Medical History:  Diagnosis Date   ADHD (attention deficit hyperactivity  disorder)    no meds taken   Anxiety    Diabetes mellitus    pre-diabetic,told when younger   Family history of breast cancer    12/20 cancer genetic testing letter sent   Headache(784.0)    chronic migraines, decrease since BCP started   Idiopathic intracranial hypertension    Insomnia    Irregular menstrual cycle    pt states she started BCP,unsure of name to help regulate cycle   Obesity    Vision abnormalities    near sighted,wears glasses    Past Surgical History:  Procedure Laterality Date    CHOLECYSTECTOMY     TONSILLECTOMY     at 25yo    Family Psychiatric History: Please see initial evaluation for full details. I have reviewed the history. No updates at this time.     Family History:  Family History  Problem Relation Age of Onset   Thyroid cancer Mother 46   Depression Mother    Leukemia Paternal Aunt    Breast cancer Paternal Aunt 56   Skin cancer Maternal Grandfather    Anxiety disorder Father     Social History:  Social History   Socioeconomic History   Marital status: Single    Spouse name: Not on file   Number of children: Not on file   Years of education: Not on file   Highest education level: Not on file  Occupational History   Occupation: Consulting civil engineer    Comment: [employer]  12th grade  Tobacco Use   Smoking status: Never   Smokeless tobacco: Never  Vaping Use   Vaping Use: Some days  Substance and Sexual Activity   Alcohol use: Yes    Comment: occ   Drug use: No   Sexual activity: Not Currently    Partners: Male    Birth control/protection: Pill  Other Topics Concern   Not on file  Social History Narrative   Not on file   Social Determinants of Health   Financial Resource Strain: Not on file  Food Insecurity: Not on file  Transportation Needs: Not on file  Physical Activity: Not on file  Stress: Not on file  Social Connections: Not on file    Allergies:  Allergies  Allergen Reactions   Lamisil [Terbinafine]     Metabolic Disorder Labs: Lab Results  Component Value Date   HGBA1C 6.3 (H) 04/20/2020   No results found for: PROLACTIN Lab Results  Component Value Date   CHOL 151 04/20/2020   TRIG 136 04/20/2020   HDL 40 04/20/2020   CHOLHDL 3.8 04/20/2020   LDLCALC 87 04/20/2020   Lab Results  Component Value Date   TSH 6.670 (H) 04/20/2020   TSH 2.405 02/23/2015    Therapeutic Level Labs: No results found for: LITHIUM No results found for: VALPROATE No components found for:  CBMZ  Current  Medications: Current Outpatient Medications  Medication Sig Dispense Refill   methylphenidate (RITALIN) 10 MG tablet Take 1 tablet (10 mg total) by mouth daily. 30 tablet 0   aspirin-acetaminophen-caffeine (EXCEDRIN MIGRAINE) 250-250-65 MG per tablet Take 2 tablets by mouth every 6 (six) hours as needed. For migraines.      busPIRone (BUSPAR) 30 MG tablet Take 1 tablet (30 mg total) by mouth at bedtime. 30 tablet 3   cetirizine (ZYRTEC) 10 MG tablet TK 1 T PO BID     Cholecalciferol (VITAMIN D-1000 MAX ST) 25 MCG (1000 UT) tablet Take by mouth.     desvenlafaxine (PRISTIQ) 100 MG  24 hr tablet Take 1 tablet (100 mg total) by mouth daily. 30 tablet 3   hydrochlorothiazide (MICROZIDE) 12.5 MG capsule Take 12.5 mg by mouth daily.     levonorgestrel-ethinyl estradiol (INTROVALE) 0.15-0.03 MG tablet Take 1 tablet by mouth daily. 91 tablet 4   meloxicam (MOBIC) 15 MG tablet TK 1 T PO ONCE A DAY  2   methylphenidate (RITALIN) 10 MG tablet Take 10 mg by mouth daily.     Multiple Vitamin (MULTI-VITAMIN) tablet Take by mouth.     pantoprazole (PROTONIX) 40 MG tablet   11   [START ON 04/06/2021] QUEtiapine (SEROQUEL) 25 MG tablet Take 1 tablet (25 mg total) by mouth at bedtime. 30 tablet 2   spironolactone (ALDACTONE) 100 MG tablet TK 1 T PO QD B NOON  9   vitamin B-12 (CYANOCOBALAMIN) 1000 MCG tablet Take by mouth.     [START ON 04/18/2021] zaleplon (SONATA) 10 MG capsule Take 2 capsules (20 mg total) by mouth at bedtime. 60 capsule 0   No current facility-administered medications for this visit.     Musculoskeletal: Strength & Muscle Tone:  N/A Gait & Station:  N/A Patient leans: N/A  Psychiatric Specialty Exam: Review of Systems  Psychiatric/Behavioral:  Positive for decreased concentration, dysphoric mood and sleep disturbance. Negative for agitation, behavioral problems, confusion, hallucinations, self-injury and suicidal ideas. The patient is nervous/anxious. The patient is not hyperactive.    All other systems reviewed and are negative.  There were no vitals taken for this visit.There is no height or weight on file to calculate BMI.  General Appearance: Fairly Groomed  Eye Contact:  Good  Speech:  Clear and Coherent  Volume:  Normal  Mood:  Depressed  Affect:  Appropriate, Congruent, and Restricted  Thought Process:  Coherent  Orientation:  Full (Time, Place, and Person)  Thought Content: Logical   Suicidal Thoughts:  No  Homicidal Thoughts:  No  Memory:  Immediate;   Good  Judgement:  Good  Insight:  Fair  Psychomotor Activity:  Normal  Concentration:  Concentration: Good and Attention Span: Good  Recall:  Good  Fund of Knowledge: Good  Language: Good  Akathisia:  No  Handed:  Right  AIMS (if indicated): not done  Assets:  Communication Skills Desire for Improvement  ADL's:  Intact  Cognition: WNL  Sleep:  Poor   Screenings: GAD-7    Flowsheet Row Office Visit from 04/16/2020 in [facility] Office Visit from 04/15/2019 in [facility]  Total GAD-7 Score 7 10      PHQ2-9    Flowsheet Row Video Visit from 02/03/2021 in [facility] Counselor from 01/25/2021 in [facility] Counselor from 11/11/2020 in [facility] Video Visit from 09/09/2020 in [facility] Office Visit from 04/16/2020 in [facility]  PHQ-2 Total Score 2 0 1 2 2   PHQ-9 Total Score 12 -- -- 9 9      Flowsheet Row Video Visit from 02/03/2021 in [facility] Counselor from 01/25/2021 in [facility] Video Visit from 12/08/2020 in [facility]  C-SSRS RISK CATEGORY No Risk No Risk No Risk        Assessment and Plan:  Martha [name] is a 25 y.o. year old female with a history of  depression, anxiety, ADHD by history, migraine, GERD, who presents for follow up appointment for  below.   1. Moderate episode of recurrent major depressive disorder (  HCC) Although she continues to report depressive symptoms and irritability, she has been able to handle things relatively well.  Psychosocial stressors includes work, loss of friends and mother, grandfather, aunt.  Other  psychosocial stressors includes financial strain, conflict with her father, medical condition of her grandmother with Alzheimer disease, who used to be a mother for a year as a child.  Will continue current medication regimen at this time given the last day at work is tomorrow, which will hopefully alleviate some of her stress.  Will continue Pristiq to target depression.  Will continue quetiapine as adjunctive treatment for depression.  Will continue BuSpar for anxiety.  She will continue to see a therapist.   2. Insomnia, unspecified type Worsening in the context of not being able to get Zaleplon due to issues with prior authorization.  Will continue current dose to target insomnia. Noted that she has pending sleep evaluation after payment is done .  3. Attention deficit hyperactivity disorder (ADHD), unspecified ADHD type She had neuropsychological testing, and the result is consistent with ADHD.  Will restart Ritalin given she reports good benefit from this medication in the past.  Discussed potential risks, includes but not limited to headache, worsening in anxiety, palpitation.    Plan  Continue Pristiq 100 mg daily  Continue quetiapine 25 mg at night Continue buspirone 30 mg at night Continue Zaleplon 10 mg at night as needed for sleep Start Ritalin 10 mg daily  Next appointment: 11/23 at 10 AM, video - She has an upcoming appointment with her neurology for migraine   This clinician has discussed the side effect associated with medication prescribed during this encounter. Please refer to notes in the previous encounters for more details.    Past trials of medication: sertraline, Pristiq, bupropion  (some adverse reaction in the past), Abilify (visual change)     The patient demonstrates the following risk factors for suicide: Chronic risk factors for suicide include: psychiatric disorder of depression, anxiety. Acute risk factors for suicide include: family or marital conflict. Protective factors for this patient include: positive social support, coping skills and hope for the future. Considering these factors, the overall suicide risk at this point appears to be low. Patient is appropriate for outpatient follow up.  Neysa Hotter, MD 03/30/2021, 4:27 PM

## 2021-03-30 ENCOUNTER — Telehealth (INDEPENDENT_AMBULATORY_CARE_PROVIDER_SITE_OTHER): Payer: 59 | Admitting: Psychiatry

## 2021-03-30 ENCOUNTER — Other Ambulatory Visit: Payer: Self-pay

## 2021-03-30 ENCOUNTER — Encounter: Payer: Self-pay | Admitting: Psychiatry

## 2021-03-30 DIAGNOSIS — F331 Major depressive disorder, recurrent, moderate: Secondary | ICD-10-CM

## 2021-03-30 DIAGNOSIS — G4700 Insomnia, unspecified: Secondary | ICD-10-CM | POA: Diagnosis not present

## 2021-03-30 DIAGNOSIS — F909 Attention-deficit hyperactivity disorder, unspecified type: Secondary | ICD-10-CM | POA: Diagnosis not present

## 2021-03-30 MED ORDER — QUETIAPINE FUMARATE 25 MG PO TABS: 25.0000 mg | ORAL_TABLET | Freq: Every day | ORAL | 2 refills | Status: DC

## 2021-03-30 MED ORDER — METHYLPHENIDATE HCL 10 MG PO TABS: 10.0000 mg | ORAL_TABLET | Freq: Every day | ORAL | 0 refills | Status: DC

## 2021-03-30 MED ORDER — ZALEPLON 10 MG PO CAPS: 20.0000 mg | ORAL_CAPSULE | Freq: Every day | ORAL | 0 refills | Status: DC

## 2021-04-05 ENCOUNTER — Other Ambulatory Visit: Payer: Self-pay

## 2021-04-05 ENCOUNTER — Ambulatory Visit (INDEPENDENT_AMBULATORY_CARE_PROVIDER_SITE_OTHER): Payer: 59 | Admitting: Licensed Clinical Social Worker

## 2021-04-05 DIAGNOSIS — F331 Major depressive disorder, recurrent, moderate: Secondary | ICD-10-CM | POA: Diagnosis not present

## 2021-04-05 NOTE — Plan of Care (Signed)
  Problem: Decrease depressive symptoms and improve levels of effective functioning Goal: LTG: Reduce frequency, intensity, and duration of depression symptoms as evidenced by: SSB input needed on appropriate metric Outcome: Progressing Goal: STG: Francine WILL PARTICIPATE IN AT LEAST 80% OF SCHEDULED INDIVIDUAL PSYCHOTHERAPY SESSIONS Outcome: Progressing Intervention: WORK WITH Yentl TO IDENTIFY THE MAJOR COMPONENTS OF A RECENT EPISODE OF DEPRESSION: PHYSICAL SYMPTOMS, MAJOR THOUGHTS AND IMAGES, AND MAJOR BEHAVIORS THEY EXPERIENCED Intervention: Encourage new environment or opportunities for social interaction Intervention: Identify effective coping behavior Intervention: REVIEW PLEASE SKILLS (TREAT PHYSICAL ILLNESS, BALANCE EATING, AVOID MOOD-ALTERING SUBSTANCES, BALANCE SLEEP AND GET EXERCISE) WITH Nelva Bush

## 2021-04-05 NOTE — Progress Notes (Signed)
Virtual Visit via Video Note  I connected with Martha [name] on 04/05/21 at 11:00 AM EST by a video enabled telemedicine application and verified that I am speaking with the correct person using two identifiers.  Location: Patient: home Provider: remote office [place], [place])    I discussed the limitations of evaluation and management by telemedicine and the availability of in person appointments. The patient expressed understanding and agreed to proceed.  I discussed the assessment and treatment plan with the patient. The patient was provided an opportunity to ask questions and all were answered. The patient agreed with the plan and demonstrated an understanding of the instructions.   The patient was advised to call back or seek an in-person evaluation if the symptoms worsen or if the condition fails to improve as anticipated.  I provided 35 minutes of non-face-to-face time during this encounter.   Chrisette Man R Askari Kinley, LCSW   THERAPIST PROGRESS NOTE  Session Time: 11-1135a  Participation Level: Active  Behavioral Response: NeatAlertAnxious and Depressed  Type of Therapy: Individual Therapy  Treatment Goals addressed:  Problem: Decrease depressive symptoms and improve levels of effective functioning Goal: LTG: Reduce frequency, intensity, and duration of depression symptoms as evidenced by: SSB input needed on appropriate metric Outcome: Progressing Goal: STG: Martha [name] WILL PARTICIPATE IN AT LEAST 80% OF SCHEDULED INDIVIDUAL PSYCHOTHERAPY SESSIONS Outcome: Progressing Interventions:  Intervention: WORK WITH Jolyn TO IDENTIFY THE MAJOR COMPONENTS OF A RECENT EPISODE OF DEPRESSION: PHYSICAL SYMPTOMS, MAJOR THOUGHTS AND IMAGES, AND MAJOR BEHAVIORS THEY EXPERIENCED Intervention: Encourage new environment or opportunities for social interaction Intervention: Identify effective coping behavior Intervention: REVIEW PLEASE SKILLS (TREAT PHYSICAL ILLNESS, BALANCE EATING, AVOID  MOOD-ALTERING SUBSTANCES, BALANCE SLEEP AND GET EXERCISE) WITH Martha [name]   Summary: Martha [name] is a 25 y.o. female who presents with symptoms consistent with depression. Pt reports that overall mood has been stable and that she is managing stressors well. Pt reports that she recently left her job at [employer] because working until 10:00pm or later was impacting her bedtime routine. "I like to get in the bed around 9".  Pt reports that she has applied for another job, had job interview, but has not heard back from them after the interview. When asked about the plan moving forward (now that pt does not have a job) pt states "well, I don't know, I guess I will figure it out".    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 Pt states she has an upcoming appt with an infectious disease doctor to see if its a recurring infection.  Pt also states she needs to see an ENT specialist.   PT reports that she is not working out or engaging socially with others.  Reviewed coping skills to manage depression and anxiety symptoms.  Continued recommendations are as follows: self care behaviors, positive social engagements, focusing on overall work/home/life balance, and focusing on positive physical and emotional wellness. .   Suicidal/Homicidal: No  Therapist Response: Pt is continuing to apply interventions learned in session into daily life situations. Pt is currently on track to meet goals utilizing interventions mentioned above. Personal growth and progress noted. Treatment to continue as indicated.   Plan: Return again in 4 weeks.  Diagnosis: Axis I: MDD, recurrent    Axis II: No diagnosis    Ernest Haber Yaneliz Radebaugh, LCSW 04/05/2021

## 2021-04-13 NOTE — Progress Notes (Signed)
Virtual Visit via Video Note  I connected with Martha [name] on 04/14/21 at  1:00 PM EST by a video enabled telemedicine application and verified that I am speaking with the correct person using two identifiers.  Location: Patient: home Provider: office Persons participated in the visit- patient, provider    I discussed the limitations of evaluation and management by telemedicine and the availability of in person appointments. The patient expressed understanding and agreed to proceed.    I discussed the assessment and treatment plan with the patient. The patient was provided an opportunity to ask questions and all were answered. The patient agreed with the plan and demonstrated an understanding of the instructions.   The patient was advised to call back or seek an in-person evaluation if the symptoms worsen or if the condition fails to improve as anticipated.  I provided 15 minutes of non-face-to-face time during this encounter.   Neysa Hotter, MD    [facility] [place] MD/PA/NP OP Progress Note  04/14/2021 1:30 PM Martha [name]  MRN:  [id number]  Chief Complaint:  Chief Complaint   Follow-up; Depression    HPI:  This is a follow-up appointment for depression, insomnia and ADHD.  She states that she will start work at [employer] on Nov 30th.  She feels good that she will have routine full-time schedule.  Her father has been sick due to some viral illness.  She also did have URI symptoms, and was started on prednisone.  She will have an appointment with the ENT next month.  She has been able to remembering things well since starting Ritalin.  She has been able to do cleaning, which she was unable to do due to low motivation.  Although she tends to feel anxious when she is in a crowd, she has been able to feel relaxed otherwise.  She denies panic attacks except that she has occasional intense anxiety with some physical symptoms.  She has insomnia, which she attributes to having completed a  course of prednisone.  She has slight increase in appetite.  She denies feeling depressed anhedonia.  She denies SI.  She feels comfortable to stay on the current medication regimen.   Daily routine: School work, Dietitian, social media Exercise: Employment: used to work in a Teacher, music,  [employer] for one year Support: mother Household: parents, one Medical laboratory scientific officer. Older brother lives next to them.  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 She was reportedly diagnosed with ADHD. 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 Recurrent major depressive disorder, in partial remission (HCC)  F33.41     2. Insomnia, unspecified type  G47.00     3. Attention deficit hyperactivity disorder (ADHD), unspecified ADHD type  F90.9       Past Psychiatric History: Please see initial evaluation for full details. I have reviewed the history. No updates at this time.     Past Medical History:  Past Medical History:  Diagnosis Date   ADHD (attention deficit hyperactivity disorder)    no meds taken   Anxiety    Diabetes mellitus    pre-diabetic,told when younger   Family history of breast cancer    12/20 cancer genetic testing letter sent   Headache(784.0)    chronic migraines, decrease since BCP started   Idiopathic intracranial hypertension    Insomnia    Irregular menstrual cycle  pt states she started BCP,unsure of name to help regulate cycle   Obesity    Vision abnormalities    near sighted,wears glasses    Past Surgical History:  Procedure Laterality Date   CHOLECYSTECTOMY     TONSILLECTOMY     at 25yo    Family Psychiatric History: Please see initial evaluation for full details. I have reviewed the history. No updates at this time.     Family History:  Family History  Problem Relation Age of Onset   Thyroid cancer Mother 33    Depression Mother    Leukemia Paternal Aunt    Breast cancer Paternal Aunt 30   Skin cancer Maternal Grandfather    Anxiety disorder Father     Social History:  Social History   Socioeconomic History   Marital status: Single    Spouse name: Not on file   Number of children: Not on file   Years of education: Not on file   Highest education level: Not on file  Occupational History   Occupation: Consulting civil engineer    Comment: [employer] 12th grade  Tobacco Use   Smoking status: Never   Smokeless tobacco: Never  Vaping Use   Vaping Use: Some days  Substance and Sexual Activity   Alcohol use: Yes    Comment: occ   Drug use: No   Sexual activity: Not Currently    Partners: Male    Birth control/protection: Pill  Other Topics Concern   Not on file  Social History Narrative   Not on file   Social Determinants of Health   Financial Resource Strain: Not on file  Food Insecurity: Not on file  Transportation Needs: Not on file  Physical Activity: Not on file  Stress: Not on file  Social Connections: Not on file    Allergies:  Allergies  Allergen Reactions   Lamisil [Terbinafine]     Metabolic Disorder Labs: Lab Results  Component Value Date   HGBA1C 6.3 (H) 04/20/2020   No results found for: PROLACTIN Lab Results  Component Value Date   CHOL 151 04/20/2020   TRIG 136 04/20/2020   HDL 40 04/20/2020   CHOLHDL 3.8 04/20/2020   LDLCALC 87 04/20/2020   Lab Results  Component Value Date   TSH 6.670 (H) 04/20/2020   TSH 2.405 02/23/2015    Therapeutic Level Labs: No results found for: LITHIUM No results found for: VALPROATE No components found for:  CBMZ  Current Medications: Current Outpatient Medications  Medication Sig Dispense Refill   [START ON 06/01/2021] methylphenidate (RITALIN) 10 MG tablet Take 1 tablet (10 mg total) by mouth daily. 30 tablet 0   [START ON 05/01/2021] methylphenidate (RITALIN) 10 MG tablet Take 1 tablet (10 mg total) by mouth daily. 30  tablet 0   aspirin-acetaminophen-caffeine (EXCEDRIN MIGRAINE) 250-250-65 MG per tablet Take 2 tablets by mouth every 6 (six) hours as needed. For migraines.      busPIRone (BUSPAR) 30 MG tablet Take 1 tablet (30 mg total) by mouth at bedtime. 30 tablet 3   cetirizine (ZYRTEC) 10 MG tablet TK 1 T PO BID     Cholecalciferol (VITAMIN D-1000 MAX ST) 25 MCG (1000 UT) tablet Take by mouth.     [START ON 04/17/2021] desvenlafaxine (PRISTIQ) 100 MG 24 hr tablet Take 1 tablet (100 mg total) by mouth daily. 30 tablet 5   hydrochlorothiazide (MICROZIDE) 12.5 MG capsule Take 12.5 mg by mouth daily.     levonorgestrel-ethinyl estradiol (INTROVALE) 0.15-0.03 MG tablet Take  1 tablet by mouth daily. 91 tablet 4   meloxicam (MOBIC) 15 MG tablet TK 1 T PO ONCE A DAY  2   methylphenidate (RITALIN) 10 MG tablet Take 10 mg by mouth daily.     Multiple Vitamin (MULTI-VITAMIN) tablet Take by mouth.     pantoprazole (PROTONIX) 40 MG tablet   11   QUEtiapine (SEROQUEL) 25 MG tablet Take 1 tablet (25 mg total) by mouth at bedtime. 30 tablet 2   spironolactone (ALDACTONE) 100 MG tablet TK 1 T PO QD B NOON  9   vitamin B-12 (CYANOCOBALAMIN) 1000 MCG tablet Take by mouth.     [START ON 05/19/2021] zaleplon (SONATA) 10 MG capsule Take 2 capsules (20 mg total) by mouth at bedtime. 60 capsule 0   No current facility-administered medications for this visit.     Musculoskeletal: Strength & Muscle Tone:  N/A Gait & Station:  N/A Patient leans: N/A  Psychiatric Specialty Exam: Review of Systems  Psychiatric/Behavioral:  Positive for sleep disturbance. Negative for agitation, behavioral problems, confusion, decreased concentration, dysphoric mood, hallucinations, self-injury and suicidal ideas. The patient is nervous/anxious. The patient is not hyperactive.   All other systems reviewed and are negative.  There were no vitals taken for this visit.There is no height or weight on file to calculate BMI.  General Appearance:  Fairly Groomed  Eye Contact:  Good  Speech:  Clear and Coherent  Volume:  Normal  Mood:   good  Affect:  Appropriate, Congruent, and euthymic  Thought Process:  Coherent  Orientation:  Full (Time, Place, and Person)  Thought Content: Logical   Suicidal Thoughts:  No  Homicidal Thoughts:  No  Memory:  Immediate;   Good  Judgement:  Good  Insight:  Good  Psychomotor Activity:  Normal  Concentration:  Concentration: Good and Attention Span: Good  Recall:  Good  Fund of Knowledge: Good  Language: Good  Akathisia:  No  Handed:  Right  AIMS (if indicated): not done  Assets:  Communication Skills Desire for Improvement  ADL's:  Intact  Cognition: WNL  Sleep:  Poor   Screenings: GAD-7    Flowsheet Row Office Visit from 04/16/2020 in [facility] Office Visit from 04/15/2019 in [facility]  Total GAD-7 Score 7 10      PHQ2-9    Flowsheet Row Video Visit from 02/03/2021 in [facility] Counselor from 01/25/2021 in [facility] Counselor from 11/11/2020 in [facility] Video Visit from 09/09/2020 in [facility] Office Visit from 04/16/2020 in [facility]  PHQ-2 Total Score 2 0 1 2 2   PHQ-9 Total Score 12 -- -- 9 9      Flowsheet Row Video Visit from 02/03/2021 in [facility] Counselor from 01/25/2021 in [facility] Video Visit from 12/08/2020 in [facility]  C-SSRS RISK CATEGORY No Risk No Risk No Risk        Assessment and Plan:  Martha [name] is a 25 y.o. year old female with a history of depression, anxiety, ADHD by history, migraine, GERD, who presents for follow up appointment for below.   1. Recurrent major depressive disorder, in partial remission (HCC) There has been overall improvement in depressive symptoms since the last visit, which coincided  it was starting a new work.  Psychosocial stressors includes loss of her friends, family including her aunt, financial strain, conflict with her father, medical condition of her  grandmother with Alzheimer disease, who used to be a mother for a year as a child.  Will continue Pristiq to target depression.  Will continue quetiapine adjunctive treatment for depression.  Will continue BuSpar for anxiety.  She will continue to see a therapist.   2. Insomnia, unspecified type Worsening in the context of taking prednisone for URI symptoms.  Will continue zaleplon as needed for insomnia. Noted that she has pending sleep evaluation after payment is done .  3. Attention deficit hyperactivity disorder (ADHD), unspecified ADHD type Neuropsychological testing was consistent with ADHD.  She had great benefit from Ritalin without any side effect; will continue the current dose to target ADHD.   Plan  Continue Pristiq 100 mg daily  Continue quetiapine 25 mg at night Continue buspirone 30 mg at night Continue Zaleplon 20 mg at night as needed for sleep Start Ritalin 10 mg daily  Next appointment: 1/12 at 3 PM for 30 mins, video - She has an upcoming appointment with her neurology for migraine    This clinician has discussed the side effect associated with medication prescribed during this encounter. Please refer to notes in the previous encounters for more details.     Past trials of medication: sertraline, Pristiq, bupropion (some adverse reaction in the past), Abilify (visual change)     The patient demonstrates the following risk factors for suicide: Chronic risk factors for suicide include: psychiatric disorder of depression, anxiety. Acute risk factors for suicide include: family or marital conflict. Protective factors for this patient include: positive social support, coping skills and hope for the future. Considering these factors, the overall suicide risk at this point appears to be low. Patient is  appropriate for outpatient follow up.  Neysa Hotter, MD 04/14/2021, 1:30 PM

## 2021-04-14 ENCOUNTER — Encounter: Payer: Self-pay | Admitting: Psychiatry

## 2021-04-14 ENCOUNTER — Other Ambulatory Visit: Payer: Self-pay

## 2021-04-14 ENCOUNTER — Telehealth (INDEPENDENT_AMBULATORY_CARE_PROVIDER_SITE_OTHER): Payer: 59 | Admitting: Psychiatry

## 2021-04-14 DIAGNOSIS — G4700 Insomnia, unspecified: Secondary | ICD-10-CM | POA: Diagnosis not present

## 2021-04-14 DIAGNOSIS — F909 Attention-deficit hyperactivity disorder, unspecified type: Secondary | ICD-10-CM

## 2021-04-14 DIAGNOSIS — F3341 Major depressive disorder, recurrent, in partial remission: Secondary | ICD-10-CM | POA: Diagnosis not present

## 2021-04-14 MED ORDER — DESVENLAFAXINE SUCCINATE ER 100 MG PO TB24: 100.0000 mg | ORAL_TABLET | Freq: Every day | ORAL | 5 refills | Status: DC

## 2021-04-14 MED ORDER — METHYLPHENIDATE HCL 10 MG PO TABS: 10.0000 mg | ORAL_TABLET | Freq: Every day | ORAL | 0 refills | Status: DC

## 2021-04-14 MED ORDER — ZALEPLON 10 MG PO CAPS: 20.0000 mg | ORAL_CAPSULE | Freq: Every day | ORAL | 0 refills | Status: DC

## 2021-04-14 NOTE — Patient Instructions (Signed)
Continue Pristiq 100 mg daily  Continue quetiapine 25 mg at night Continue buspirone 30 mg at night Continue Zaleplon 20 mg at night as needed for sleep Start Ritalin 10 mg daily  Next appointment: 1/12 at 3 PM

## 2021-04-19 ENCOUNTER — Other Ambulatory Visit (HOSPITAL_COMMUNITY)
Admission: RE | Admit: 2021-04-19 | Discharge: 2021-04-19 | Disposition: A | Discharge status: Home / Self Care | Payer: 59 | Source: Ambulatory Visit | Attending: Obstetrics and Gynecology | Admitting: Obstetrics and Gynecology

## 2021-04-19 ENCOUNTER — Other Ambulatory Visit: Payer: Self-pay

## 2021-04-19 ENCOUNTER — Encounter: Payer: Self-pay | Admitting: Obstetrics and Gynecology

## 2021-04-19 ENCOUNTER — Ambulatory Visit (INDEPENDENT_AMBULATORY_CARE_PROVIDER_SITE_OTHER): Payer: 59 | Admitting: Obstetrics and Gynecology

## 2021-04-19 VITALS — BP 140/80 | Ht 69.0 in | Wt 352.6 lb

## 2021-04-19 DIAGNOSIS — Z3041 Encounter for surveillance of contraceptive pills: Secondary | ICD-10-CM | POA: Diagnosis not present

## 2021-04-19 DIAGNOSIS — R7303 Prediabetes: Secondary | ICD-10-CM

## 2021-04-19 DIAGNOSIS — Z124 Encounter for screening for malignant neoplasm of cervix: Secondary | ICD-10-CM | POA: Diagnosis present

## 2021-04-19 DIAGNOSIS — Z01419 Encounter for gynecological examination (general) (routine) without abnormal findings: Secondary | ICD-10-CM | POA: Diagnosis not present

## 2021-04-19 MED ORDER — LEVONORGEST-ETH ESTRAD 91-DAY 0.15-0.03 MG PO TABS: 1.0000 | ORAL_TABLET | Freq: Every day | ORAL | 4 refills | Status: DC

## 2021-04-19 NOTE — Patient Instructions (Signed)
[employer] Recommended Dietary Allowances for Calcium and Vitamin D  Age (yr) Calcium Recommended Dietary Allowance (mg/day) Vitamin D Recommended Dietary Allowance (international units/day)  9-18 1,300 600  19-50 1,000 600  51-70 1,200 600  71 and older 1,200 800  Data from [employer]. Dietary reference intakes: calcium, vitamin D. [place], [place]: National Academies Press; 2011.   Exercising to Stay Healthy To become healthy and stay healthy, it is recommended that you do moderate-intensity and vigorous-intensity exercise. You can tell that you are exercising at a moderate intensity if your heart starts beating faster and you start breathing faster but can still hold a conversation. You can tell that you are exercising at a vigorous intensity if you are breathing much harder and faster and cannot hold a conversation while exercising. How can exercise benefit me? Exercising regularly is important. It has many health benefits, such as: Improving overall fitness, flexibility, and endurance. Increasing bone density. Helping with weight control. Decreasing body fat. Increasing muscle strength and endurance. Reducing stress and tension, anxiety, depression, or anger. Improving overall health. What guidelines should I follow while exercising? Before you start a new exercise program, talk with your health care provider. Do not exercise so much that you hurt yourself, feel dizzy, or get very short of breath. Wear comfortable clothes and wear shoes with good support. Drink plenty of water while you exercise to prevent dehydration or heat stroke. Work out until your breathing and your heartbeat get faster (moderate intensity). How often should I exercise? Choose an activity that you enjoy, and set realistic goals. Your health care provider can help you make an activity plan that is individually designed and works best for you. Exercise regularly as told by your health  care provider. This may include: Doing strength training two times a week, such as: Lifting weights. Using resistance bands. Push-ups. Sit-ups. Yoga. Doing a certain intensity of exercise for a given amount of time. Choose from these options: A total of 150 minutes of moderate-intensity exercise every week. A total of 75 minutes of vigorous-intensity exercise every week. A mix of moderate-intensity and vigorous-intensity exercise every week. Children, pregnant women, people who have not exercised regularly, people who are overweight, and older adults may need to talk with a health care provider about what activities are safe to perform. If you have a medical condition, be sure to talk with your health care provider before you start a new exercise program. What are some exercise ideas? Moderate-intensity exercise ideas include: Walking 1 mile (1.6 km) in about 15 minutes. Biking. Hiking. Golfing. Dancing. Water aerobics. Vigorous-intensity exercise ideas include: Walking 4.5 miles (7.2 km) or more in about 1 hour. Jogging or running 5 miles (8 km) in about 1 hour. Biking 10 miles (16.1 km) or more in about 1 hour. Lap swimming. Roller-skating or in-line skating. Cross-country skiing. Vigorous competitive sports, such as football, basketball, and soccer. Jumping rope. Aerobic dancing. What are some everyday activities that can help me get exercise? Yard work, such as: Pushing a lawn mower. Raking and bagging leaves. Washing your car. Pushing a stroller. Shoveling snow. Gardening. Washing windows or floors. How can I be more active in my day-to-day activities? Use stairs instead of an elevator. Take a walk during your lunch break. If you drive, park your car farther away from your work or school. If you take public transportation, get off one stop early and walk the rest of the way. Stand up or walk around during all of   your indoor phone calls. Get up, stretch, and walk  around every 30 minutes throughout the day. Enjoy exercise with a friend. Support to continue exercising will help you keep a regular routine of activity. Where to find more information You can find more information about exercising to stay healthy from: U.S[place] Department of Health and Human Services: www.hhs.gov Centers for Disease Control and Prevention (CDC): www.cdc.gov Summary Exercising regularly is important. It will improve your overall fitness, flexibility, and endurance. Regular exercise will also improve your overall health. It can help you control your weight, reduce stress, and improve your bone density. Do not exercise so much that you hurt yourself, feel dizzy, or get very short of breath. Before you start a new exercise program, talk with your health care provider. This information is not intended to replace advice given to you by your health care provider. Make sure you discuss any questions you have with your health care provider. Document Revised: 09/11/2020 Document Reviewed: 09/11/2020 Elsevier Patient Education  2022 Else[employer] Inc. Budget-Friendly Healthy Eating There are many ways to save money at the grocery store and continue to eat healthy. You can be successful if you: Plan meals according to your budget. Make a grocery list and only purchase food according to your grocery list. Prepare food yourself at home. What are tips for following this plan? Reading food labels Compare food labels between brand name foods and the store brand. Often the nutritional value is the same, but the store brand is lower cost. Look for products that do not have added sugar, fat, or salt (sodium). These often cost the same but are healthier for you. Products may be labeled as: Sugar-free. Nonfat. Low-fat. Sodium-free. Low-sodium. Look for lean ground beef labeled as at least 92% lean and 8% fat. Shopping  Buy only the items on your grocery list and go only to the areas of the store  that have the items on your list. Use coupons only for foods and brands you normally buy. Avoid buying items you wouldn't normally buy simply because they are on sale. Check online and in newspapers for weekly deals. Buy healthy items from the bulk bins when available, such as herbs, spices, flour, pasta, nuts, and dried fruit. Buy fruits and vegetables that are in season. Prices are usually lower on in-season produce. Look at the unit price on the price tag. Use it to compare different brands and sizes to find out which item is the best deal. Choose healthy items that are often low-cost, such as carrots, potatoes, apples, bananas, and oranges. Dried or canned beans are a low-cost protein source. Buy in bulk and freeze extra food. Items you can buy in bulk include meats, fish, poultry, frozen fruits, and frozen vegetables. Avoid buying "ready-to-eat" foods, such as pre-cut fruits and vegetables and pre-made salads. If possible, shop around to discover where you can find the best prices. Consider other retailers such as dollar stores, larger wholesale stores, local fruit and vegetable stands, and farmers markets. Do not shop when you are hungry. If you shop while hungry, it may be hard to stick to your list and budget. Resist impulse buying. Use your grocery list as your official plan for the week. Buy a variety of vegetables and fruits by purchasing fresh, frozen, and canned items. Look at the top and bottom shelves for deals. Foods at eye level (eye level of an adult or child) are usually more expensive. Be efficient with your time when shopping. The more time you   spend at the store, the more money you are likely to spend. To save money when choosing more expensive foods like meats and dairy: Choose cheaper cuts of meat, such as bone-in chicken thighs and drumsticks instead of skinless and boneless chicken. When you are ready to prepare the chicken, you can remove the skin yourself to make it  healthier. Choose lean meats like chicken or turkey instead of beef. Choose canned seafood, such as tuna, salmon, or sardines. Buy eggs as a low-cost source of protein. Buy dried beans and peas, such as lentils, split peas, or kidney beans instead of meats. Dried beans and peas are a good alternative source of protein. Buy the larger tubs of yogurt instead of individual-sized containers. Choose water instead of sodas and other sweetened beverages. Avoid buying chips, cookies, and other "junk food." These items are usually expensive and not healthy. Cooking Make extra food and freeze the extras in meal-sized containers or in individual portions for fast meals and snacks. Pre-cook on days when you have extra time to prepare meals in advance. You can keep these meals in the fridge or freezer and reheat for a quick meal. When you come home from the grocery store, wash, peel, and cut fruits and vegetables so they are ready to use and eat. This will help reduce food waste. Meal planning Do not eat out or get fast food. Prepare food at home. Make a grocery list and make sure to bring it with you to the store. If you have a smart phone, you could use your phone to create your shopping list. Plan meals and snacks according to a grocery list and budget you create. Use leftovers in your meal plan for the week. Look for recipes where you can cook once and make enough food for two meals. Prepare budget-friendly types of meals like stews, casseroles, and stir-fry dishes. Try some meatless meals or try "no cook" meals like salads. Make sure that half your plate is filled with fruits or vegetables. Choose from fresh, frozen, or canned fruits and vegetables. If eating canned, remember to rinse them before eating. This will remove any excess salt added for packaging. Summary Eating healthy on a budget is possible if you plan your meals according to your budget, purchase according to your budget and grocery list,  and prepare food yourself. Tips for buying more food on a limited budget include buying generic brands, using coupons only for foods you normally buy, and buying healthy items from the bulk bins when available. Tips for buying cheaper food to replace expensive food include choosing cheaper, lean cuts of meat, and buying dried beans and peas. This information is not intended to replace advice given to you by your health care provider. Make sure you discuss any questions you have with your health care provider. Document Revised: 02/27/2020 Document Reviewed: 02/27/2020 Elsevier Patient Education  2022 Elsevier Inc. Bone Health Bones protect organs, store calcium, anchor muscles, and support the whole body. Keeping your bones strong is important, especially as you get older. You can take actions to help keep your bones strong and healthy. Why is keeping my bones healthy important? Keeping your bones healthy is important because your body constantly replaces bone cells. Cells get old, and new cells take their place. As we age, we lose bone cells because the body may not be able to make enough new cells to replace the old cells. The amount of bone cells and bone tissue you have is referred to as   bone mass. The higher your bone mass, the stronger your bones. The aging process leads to an overall loss of bone mass in the body, which can increase the likelihood of: Broken bones. A condition in which the bones become weak and brittle (osteoporosis). A large decline in bone mass occurs in older adults. In women, it occurs about the time of menopause. What actions can I take to keep my bones healthy? Good health habits are important for maintaining healthy bones. This includes eating nutritious foods and exercising regularly. To have healthy bones, you need to get enough of the right minerals and vitamins. Most nutrition experts recommend getting these nutrients from the foods that you eat. In some cases,  taking supplements may also be recommended. Doing certain types of exercise is also important for bone health. What are the nutritional recommendations for healthy bones? Eating a well-balanced diet with plenty of calcium and vitamin D will help to protect your bones. Nutritional recommendations vary from person to person. Ask your health care provider what is healthy for you. Here are some general guidelines. Get enough calcium Calcium is the most important (essential) mineral for bone health. Most people can get enough calcium from their diet, but supplements may be recommended for people who are at risk for osteoporosis. Good sources of calcium include: Dairy products, such as low-fat or nonfat milk, cheese, and yogurt. Dark green leafy vegetables, such as bok choy and broccoli. Foods that have calcium added to them (are fortified). Foods that may be fortified with calcium include orange juice, cereal, bread, soy beverages, and tofu products. Nuts, such as almonds. Follow these recommended amounts for daily calcium intake: Infants, 0-6 months: 200 mg. Infants, 6-12 months: 260 mg. Children, age 1-3: 700 mg. Children, age 4-8: 1,000 mg. Children, age 9-13: 1,300 mg. Teens, age 14-18: 1,300 mg. Adults, age 19-50: 1,000 mg. Adults, age 51-70: Men: 1,000 mg. Women: 1,200 mg. Adults, age 71 or older: 1,200 mg. Pregnant and breastfeeding females: Teens: 1,300 mg. Adults: 1,000 mg. Get enough vitamin D Vitamin D is the most essential vitamin for bone health. It helps the body absorb calcium. Sunlight stimulates the skin to make vitamin D, so be sure to get enough sunlight. If you live in a cold climate or you do not get outside often, your health care provider may recommend that you take vitamin D supplements. Good sources of vitamin D in your diet include: Egg yolks. Saltwater fish. Milk and cereal fortified with vitamin D. Follow these recommended amounts for daily vitamin D  intake: Infants, 0-12 months: 400 international units (IU). Children and teens, age 1-18: 600 international units. Adults, age 59 or younger: 600 international units. Adults, age 60 or older: 600-1,000 international units. Get other important nutrients Other nutrients that are important for bone health include: Phosphorus. This mineral is found in meat, poultry, dairy foods, nuts, and legumes. The recommended daily intake for adult men and adult women is 700 mg. Magnesium. This mineral is found in seeds, nuts, dark green vegetables, and legumes. The recommended daily intake for adult men is 400-420 mg. For adult women, it is 310-320 mg. Vitamin K. This vitamin is found in green leafy vegetables. The recommended daily intake is 120 mcg for adult men and 90 mcg for adult women. What type of physical activity is best for building and maintaining healthy bones? Weight-bearing and strength-building activities are important for building and maintaining healthy bones. Weight-bearing activities cause muscles and bones to work against gravity. Strength-building activities   increase the strength of the muscles that support bones. Weight-bearing and muscle-building activities include: Walking and hiking. Jogging and running. Dancing. Gym exercises. Lifting weights. Tennis and racquetball. Climbing stairs. Aerobics. Adults should get at least 30 minutes of moderate physical activity on most days. Children should get at least 60 minutes of moderate physical activity on most days. Ask your health care provider what type of exercise is best for you. How can I find out if my bone mass is low? Bone mass can be measured with an X-ray test called a bone mineral density (BMD) test. This test is recommended for all women who are age 29 or older. It may also be recommended for: Men who are age 51 or older. People who are at risk for osteoporosis because of: Having a long-term disease that weakens bones, such as  kidney disease or rheumatoid arthritis. Having menopause earlier than normal. Taking medicine that weakens bones, such as steroids, thyroid hormones, or hormone treatment for breast cancer or prostate cancer. Smoking. Drinking three or more alcoholic drinks a day. Being underweight. Sedentary lifestyle. If you find that you have a low bone mass, you may be able to prevent osteoporosis or further bone loss by changing your diet and lifestyle. Where can I find more information? Bone Health & Osteoporosis Foundation: https://carlson-fletcher.info/ [employer]: www.bones.http://www.myers.net/ International Osteoporosis Foundation: [employer], operational.iofbonehealth.org Summary The aging process leads to an overall loss of bone mass in the body, which can increase the likelihood of broken bones and osteoporosis. Eating a well-balanced diet with plenty of calcium and vitamin D will help to protect your bones. Weight-bearing and strength-building activities are also important for building and maintaining strong bones. Bone mass can be measured with an X-ray test called a bone mineral density (BMD) test. This information is not intended to replace advice given to you by your health care provider. Make sure you discuss any questions you have with your health care provider. Document Revised: 10/28/2020 Document Reviewed: 10/28/2020 Elsevier Patient Education  2022 Elsevier Inc.  Prediabetes Eating Plan Prediabetes is a condition that causes blood sugar (glucose) levels to be higher than normal. This increases the risk for developing type 2 diabetes (type 2 diabetes mellitus). Working with a health care provider or nutrition specialist (dietitian) to make diet and lifestyle changes can help prevent the onset of diabetes. These changes may help you: Control your blood glucose levels. Improve your cholesterol levels. Manage your blood pressure. What are tips for following this plan? Reading food labels Read food labels to  check the amount of fat, salt (sodium), and sugar in prepackaged foods. Avoid foods that have: Saturated fats. Trans fats. Added sugars. Avoid foods that have more than 300 milligrams (mg) of sodium per serving. Limit your sodium intake to less than 2,300 mg each day. Shopping Avoid buying pre-made and processed foods. Avoid buying drinks with added sugar. Cooking Cook with olive oil. Do not use butter, lard, or ghee. Bake, broil, grill, steam, or boil foods. Avoid frying. Meal planning  Work with your dietitian to create an eating plan that is right for you. This may include tracking how many calories you take in each day. Use a food diary, notebook, or mobile application to track what you eat at each meal. Consider following a Mediterranean diet. This includes: Eating several servings of fresh fruits and vegetables each day. Eating fish at least twice a week. Eating one serving each day of whole grains, beans, nuts, and seeds. Using olive oil instead  of other fats. Limiting alcohol. Limiting red meat. Using nonfat or low-fat dairy products. Consider following a plant-based diet. This includes dietary choices that focus on eating mostly vegetables and fruit, grains, beans, nuts, and seeds. If you have high blood pressure, you may need to limit your sodium intake or follow a diet such as the DASH (Dietary Approaches to Stop Hypertension) eating plan. The DASH diet aims to lower high blood pressure. Lifestyle Set weight loss goals with help from your health care team. It is recommended that most people with prediabetes lose 7% of their body weight. Exercise for at least 30 minutes 5 or more days a week. Attend a support group or seek support from a mental health counselor. Take over-the-counter and prescription medicines only as told by your health care provider. What foods are recommended? Fruits Berries. Bananas. Apples. Oranges. Grapes. Papaya. Mango. Pomegranate. Kiwi. Grapefruit.  Cherries. Vegetables Lettuce. Spinach. Peas. Beets. Cauliflower. Cabbage. Broccoli. Carrots. Tomatoes. Squash. Eggplant. Herbs. Peppers. Onions. Cucumbers. Brussels sprouts. Grains Whole grains, such as whole-wheat or whole-grain breads, crackers, cereals, and pasta. Unsweetened oatmeal. Bulgur. Barley. Quinoa. Brown rice. Corn or whole-wheat flour tortillas or taco shells. Meats and other proteins Seafood. Poultry without skin. Lean cuts of pork and beef. Tofu. Eggs. Nuts. Beans. Dairy Low-fat or fat-free dairy products, such as yogurt, cottage cheese, and cheese. Beverages Water. Tea. Coffee. Sugar-free or diet soda. Seltzer water. Low-fat or nonfat milk. Milk alternatives, such as soy or almond milk. Fats and oils Olive oil. Canola oil. Sunflower oil. Grapeseed oil. Avocado. Walnuts. Sweets and desserts Sugar-free or low-fat pudding. Sugar-free or low-fat ice cream and other frozen treats. Seasonings and condiments Herbs. Sodium-free spices. Mustard. Relish. Low-salt, low-sugar ketchup. Low-salt, low-sugar barbecue sauce. Low-fat or fat-free mayonnaise. The items listed above may not be a complete list of recommended foods and beverages. Contact a dietitian for more information. What foods are not recommended? Fruits Fruits canned with syrup. Vegetables Canned vegetables. Frozen vegetables with butter or cream sauce. Grains Refined white flour and flour products, such as bread, pasta, snack foods, and cereals. Meats and other proteins Fatty cuts of meat. Poultry with skin. Breaded or fried meat. Processed meats. Dairy Full-fat yogurt, cheese, or milk. Beverages Sweetened drinks, such as iced tea and soda. Fats and oils Butter. Lard. Ghee. Sweets and desserts Baked goods, such as cake, cupcakes, pastries, cookies, and cheesecake. Seasonings and condiments Spice mixes with added salt. Ketchup. Barbecue sauce. Mayonnaise. The items listed above may not be a complete list of foods  and beverages that are not recommended. Contact a dietitian for more information. Where to find more information American Diabetes Association: www.diabetes.org Summary You may need to make diet and lifestyle changes to help prevent the onset of diabetes. These changes can help you control blood sugar, improve cholesterol levels, and manage blood pressure. Set weight loss goals with help from your health care team. It is recommended that most people with prediabetes lose 7% of their body weight. Consider following a Mediterranean diet. This includes eating plenty of fresh fruits and vegetables, whole grains, beans, nuts, seeds, fish, and low-fat dairy, and using olive oil instead of other fats. This information is not intended to replace advice given to you by your health care provider. Make sure you discuss any questions you have with your health care provider. Document Revised: 08/15/2019 Document Reviewed: 08/15/2019 Elsevier Patient Education  2022 [employer].

## 2021-04-19 NOTE — Progress Notes (Signed)
Gynecology Annual Exam  PCP: Orene DesanctisBehling, Karen, MD  Chief Complaint:  Chief Complaint  Patient presents with   Gynecologic Exam    History of Present Illness: Patient is a 25 y.o. G0P0000 presents for annual exam. The patient has no complaints today.   LMP: No LMP recorded. (Menstrual status: Oral contraceptives). Average Interval: every 3 months- extended cycle pill Duration of flow: 4 days Heavy Menses: no Dysmenorrhea: no  She reports passage of large clots She denies sensations of gushing or flooding of blood. She denies accidents where she bleeds through her clothing. She denies that she changes a saturated pad or tampon more frequently than every hour.  She denies that pain from her periods limits her activities.  The patient does perform self breast exams.  There is notable family history of breast or ovarian cancer in her family.  The patient reports her exercise generally consists of nothing.  The patient reports current symptoms of depression.  She is currently following with psychiatry for anxiety and depression.  PHQ-9: 9 GAD-7: 7   Review of Systems: Review of Systems  Constitutional:  Negative for chills, fever, malaise/fatigue and weight loss.  HENT:  Negative for congestion, hearing loss and sinus pain.   Eyes:  Negative for blurred vision and double vision.  Respiratory:  Negative for cough, sputum production, shortness of breath and wheezing.   Cardiovascular:  Negative for chest pain, palpitations, orthopnea and leg swelling.  Gastrointestinal:  Negative for abdominal pain, constipation, diarrhea, nausea and vomiting.  Genitourinary:  Negative for dysuria, flank pain, frequency, hematuria and urgency.  Musculoskeletal:  Negative for back pain, falls and joint pain.  Skin:  Negative for itching and rash.  Neurological:  Negative for dizziness and headaches.  Psychiatric/Behavioral:  Negative for depression, substance abuse and suicidal ideas. The  patient is not nervous/anxious.    Past Medical History:  Past Medical History:  Diagnosis Date   ADHD (attention deficit hyperactivity disorder)    no meds taken   Anxiety    Diabetes mellitus    pre-diabetic,told when younger   Family history of breast cancer    12/20 cancer genetic testing letter sent   Headache(784.0)    chronic migraines, decrease since BCP started   Idiopathic intracranial hypertension    Insomnia    Irregular menstrual cycle    pt states she started BCP,unsure of name to help regulate cycle   Obesity    Vision abnormalities    near sighted,wears glasses    Past Surgical History:  Past Surgical History:  Procedure Laterality Date   CHOLECYSTECTOMY     TONSILLECTOMY     at 25yo    Gynecologic History:  No LMP recorded. (Menstrual status: Oral contraceptives). Menarche: 10  History of fibroids, polyps, or ovarian cysts? : yes  History of PCOS? yes Hstory of Endometriosis? yes History of abnormal pap smears? no Have you had any sexually transmitted infections in the past? no  She reports HPV vaccination in the past.   Last Pap: Results were: 2019 NIL   She identifies as a female. She is not currently sexually active with men.   She denies dyspareunia. She denies postcoital bleeding.  She currently uses OCP (estrogen/progesterone) for contraception.    Obstetric History: G0P0000  Family History:  Family History  Problem Relation Age of Onset   Thyroid cancer Mother [id number]   Depression Mother    Leukemia Paternal Aunt    Breast cancer Paternal Aunt [phone]   Skin  cancer Maternal Grandfather    Anxiety disorder Father     Social History:  Social History   Socioeconomic History   Marital status: Single    Spouse name: Not on file   Number of children: Not on file   Years of education: Not on file   Highest education level: Not on file  Occupational History   Occupation: Ship broker    Comment: [employer] 12th grade  Tobacco Use    Smoking status: Never   Smokeless tobacco: Never  Vaping Use   Vaping Use: Some days  Substance and Sexual Activity   Alcohol use: Yes    Comment: occ   Drug use: No   Sexual activity: Not Currently    Partners: Male    Birth control/protection: Pill  Other Topics Concern   Not on file  Social History Narrative   Not on file   Social Determinants of Health   Financial Resource Strain: Not on file  Food Insecurity: Not on file  Transportation Needs: Not on file  Physical Activity: Not on file  Stress: Not on file  Social Connections: Not on file  Intimate Partner Violence: Not on file    Allergies:  Allergies  Allergen Reactions   Lamisil [Terbinafine]     Medications: Prior to Admission medications   Medication Sig Start Date End Date Taking? Authorizing Provider  aspirin-acetaminophen-caffeine (EXCEDRIN MIGRAINE) [phone] MG per tablet Take 2 tablets by mouth every 6 (six) hours as needed. For migraines.    Yes [provider]  busPIRone (BUSPAR) 30 MG tablet Take 1 tablet (30 mg total) by mouth at bedtime. 02/16/21 06/16/21 Yes Hisada, Elie Goody, MD  cetirizine (ZYRTEC) 10 MG tablet TK 1 T PO BID 10/03/17  Yes [provider]  Cholecalciferol (VITAMIN D-1000 MAX ST) 25 MCG (1000 UT) tablet Take by mouth.   Yes [provider]  desvenlafaxine (PRISTIQ) 100 MG 24 hr tablet Take 1 tablet (100 mg total) by mouth daily. 04/17/21 10/14/21 Yes Hisada, Elie Goody, MD  hydrochlorothiazide (MICROZIDE) 12.5 MG capsule Take 12.5 mg by mouth daily.   Yes [provider]  meloxicam (MOBIC) 15 MG tablet TK 1 T PO ONCE A DAY 01/23/18  Yes [provider]  methylphenidate (RITALIN) 10 MG tablet Take 1 tablet (10 mg total) by mouth daily. 06/01/21 07/01/21 Yes Norman Clay, MD  Multiple Vitamin (MULTI-VITAMIN) tablet Take by mouth.   Yes [provider]  pantoprazole (PROTONIX) 40 MG tablet  04/02/18  Yes [provider]  QUEtiapine  (SEROQUEL) 25 MG tablet Take 1 tablet (25 mg total) by mouth at bedtime. 04/06/21 07/05/21 Yes Norman Clay, MD  spironolactone (ALDACTONE) 100 MG tablet TK 1 T PO QD B NOON 04/02/18  Yes [provider]  vitamin B-12 (CYANOCOBALAMIN) 1000 MCG tablet Take by mouth.   Yes [provider]  zaleplon (SONATA) 10 MG capsule Take 2 capsules (20 mg total) by mouth at bedtime. 05/19/21 06/18/21 Yes Hisada, Elie Goody, MD  levonorgestrel-ethinyl estradiol (INTROVALE) 0.15-0.03 MG tablet Take 1 tablet by mouth daily. 04/19/21   Christie Viscomi, Stefanie Libel, MD  methylphenidate (RITALIN) 10 MG tablet Take 10 mg by mouth daily. 04/02/19   [provider]  methylphenidate (RITALIN) 10 MG tablet Take 1 tablet (10 mg total) by mouth daily. 05/01/21 05/31/21  Norman Clay, MD    Physical Exam Vitals: Blood pressure 140/80, height 5\' 9"  (1.753 m), weight (!) 352 lb 9.6 oz (159.9 kg).  Physical Exam Constitutional:  Appearance: She is well-developed.  Genitourinary:     Genitourinary Comments: External: Normal appearing vulva. No lesions noted.  Speculum examination: Normal appearing cervix. No blood in the vaginal vault. No discharge.   Bimanual examination: Uterus midline, non-tender, normal in size, shape and contour.  No CMT. No adnexal masses. No adnexal tenderness. Pelvis not fixed.  Breast Exam: breast equal without skin changes, nipple discharge, breast lump or enlarged lymph nodes   HENT:     Head: Normocephalic and atraumatic.  Neck:     Thyroid: No thyromegaly.  Cardiovascular:     Rate and Rhythm: Normal rate and regular rhythm.     Heart sounds: Normal heart sounds.  Pulmonary:     Effort: Pulmonary effort is normal.     Breath sounds: Normal breath sounds.  Abdominal:     General: Bowel sounds are normal. There is no distension.     Palpations: Abdomen is soft. There is no mass.  Musculoskeletal:     Cervical back: Neck supple.  Neurological:     Mental Status: She is  alert and oriented to person, place, and time.  Skin:    General: Skin is warm and dry.  Psychiatric:        Behavior: Behavior normal.        Thought Content: Thought content normal.        Judgment: Judgment normal.  Vitals reviewed.     Female chaperone present for pelvic and breast  portions of the physical exam  Assessment: 25 y.o. G0P0000 routine annual exam  Plan: Problem List Items Addressed This Visit   None Visit Diagnoses     Encounter for annual routine gynecological examination    -  Primary   Encounter for birth control pills maintenance       Relevant Medications   levonorgestrel-ethinyl estradiol (INTROVALE) 0.15-0.03 MG tablet   Cervical cancer screening       Relevant Orders   Cytology - PAP   Prediabetes       Relevant Orders   Hemoglobin A1c       1) STI screening was offered and accepted  2) Pap smear today  3) Contraception - Continue extended cycle OCP  4) Routine healthcare maintenance including cholesterol, diabetes screening discussed managed by PCP. Hgba1c today  Adelene Idler MD, [facility], [facility] 04/19/2021 12:29 PM

## 2021-04-20 ENCOUNTER — Encounter: Payer: Self-pay | Admitting: Obstetrics and Gynecology

## 2021-04-20 LAB — HEMOGLOBIN A1C
Est. average glucose Bld gHb Est-mCnc: 140 mg/dL
Hgb A1c MFr Bld: 6.5 % — ABNORMAL HIGH (ref 4.8–5.6)

## 2021-04-21 ENCOUNTER — Telehealth: Payer: 59 | Admitting: Psychiatry

## 2021-04-26 LAB — CYTOLOGY - PAP
Chlamydia: NEGATIVE
Comment: NEGATIVE
Comment: NEGATIVE
Comment: NORMAL
Diagnosis: NEGATIVE
Neisseria Gonorrhea: NEGATIVE
Trichomonas: NEGATIVE

## 2021-05-10 ENCOUNTER — Ambulatory Visit (INDEPENDENT_AMBULATORY_CARE_PROVIDER_SITE_OTHER): Payer: 59 | Admitting: Licensed Clinical Social Worker

## 2021-05-10 ENCOUNTER — Other Ambulatory Visit: Payer: Self-pay

## 2021-05-10 DIAGNOSIS — F3341 Major depressive disorder, recurrent, in partial remission: Secondary | ICD-10-CM | POA: Diagnosis not present

## 2021-05-10 NOTE — Progress Notes (Signed)
Virtual Visit via Video Note  I connected with Martha [name] on 05/10/21 at  1:00 PM EST by a video enabled telemedicine application and verified that I am speaking with the correct person using two identifiers.  Location: Patient: home Provider: remote office [place], [place])    I discussed the limitations of evaluation and management by telemedicine and the availability of in person appointments. The patient expressed understanding and agreed to proceed.  I discussed the assessment and treatment plan with the patient. The patient was provided an opportunity to ask questions and all were answered. The patient agreed with the plan and demonstrated an understanding of the instructions.   The patient was advised to call back or seek an in-person evaluation if the symptoms worsen or if the condition fails to improve as anticipated.  I provided 20 minutes of non-face-to-face time during this encounter.   Nazly Digilio R Lynsey Ange, LCSW   THERAPIST PROGRESS NOTE  Session Time: 1-120p  Participation Level: Active  Behavioral Response: NeatAlertAnxious and Depressed  Type of Therapy: Individual Therapy  Treatment Goals addressed:  Problem: Decrease depressive symptoms and improve levels of effective functioning Goal: LTG: Reduce frequency, intensity, and duration of depression symptoms as evidenced by: SSB input needed on appropriate metric Outcome: Progressing  Goal: STG: Martha [name] WILL PARTICIPATE IN AT LEAST 80% OF SCHEDULED INDIVIDUAL PSYCHOTHERAPY SESSIONS Outcome: Progressing  Interventions:  Intervention: Encourage new environment or opportunities for social interaction  Intervention: Identify effective coping behavior  Intervention: REVIEW PLEASE SKILLS (TREAT PHYSICAL ILLNESS, BALANCE EATING, AVOID MOOD-ALTERING SUBSTANCES, BALANCE SLEEP AND GET EXERCISE) WITH Martha [name]   Summary: Martha [name] is a 25 y.o. female who presents with symptoms consistent with depression. Pt reports  that overall mood has been stable and that she is managing stressors well.   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 Pt is excited that this will be a full time job with benefits working first shift. Pt reports that her parents are happy with the decision, as well. Pt does not report any additional external stressors at this time. Pt has plans with immediate family for christmas and is looking forward to those.   PT reports that she is not working out or engaging socially with others.  Reviewed coping skills to manage depression and anxiety symptoms.  Continued recommendations are as follows: self care behaviors, positive social engagements, focusing on overall work/home/life balance, and focusing on positive physical and emotional wellness. .   Suicidal/Homicidal: No  Therapist Response: Pt is continuing to apply interventions learned in session into daily life situations. Pt is currently on track to meet goals utilizing interventions mentioned above. Personal growth and progress noted. Treatment to continue as indicated.   Plan: Return again in 4 weeks.  Diagnosis: Axis I: MDD, recurrent    Axis II: No diagnosis    Ernest Haber Shiron Whetsel, LCSW 05/10/2021

## 2021-05-10 NOTE — Plan of Care (Signed)
  Problem: Decrease depressive symptoms and improve levels of effective functioning Goal: LTG: Reduce frequency, intensity, and duration of depression symptoms as evidenced by: SSB input needed on appropriate metric Outcome: Progressing Goal: STG: Adrine WILL PARTICIPATE IN AT LEAST 80% OF SCHEDULED INDIVIDUAL PSYCHOTHERAPY SESSIONS Outcome: Progressing

## 2021-05-11 ENCOUNTER — Ambulatory Visit: Payer: 59 | Admitting: Psychology

## 2021-05-12 ENCOUNTER — Ambulatory Visit: Payer: Self-pay | Admitting: Psychology

## 2021-05-19 ENCOUNTER — Ambulatory Visit: Payer: 59 | Admitting: Psychology
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
## 2021-06-10 ENCOUNTER — Encounter: Payer: Self-pay | Admitting: Psychiatry

## 2021-06-10 ENCOUNTER — Other Ambulatory Visit: Payer: Self-pay

## 2021-06-10 ENCOUNTER — Telehealth (INDEPENDENT_AMBULATORY_CARE_PROVIDER_SITE_OTHER): Payer: 59 | Admitting: Psychiatry

## 2021-06-10 DIAGNOSIS — G4700 Insomnia, unspecified: Secondary | ICD-10-CM

## 2021-06-10 DIAGNOSIS — F3341 Major depressive disorder, recurrent, in partial remission: Secondary | ICD-10-CM

## 2021-06-10 DIAGNOSIS — F909 Attention-deficit hyperactivity disorder, unspecified type: Secondary | ICD-10-CM | POA: Diagnosis not present

## 2021-06-10 MED ORDER — ZALEPLON 10 MG PO CAPS: 20.0000 mg | ORAL_CAPSULE | Freq: Every day | ORAL | 1 refills | Status: DC

## 2021-06-10 MED ORDER — BUSPIRONE HCL 30 MG PO TABS: 30.0000 mg | ORAL_TABLET | Freq: Every day | ORAL | 3 refills | Status: DC

## 2021-06-10 MED ORDER — QUETIAPINE FUMARATE 25 MG PO TABS: 25.0000 mg | ORAL_TABLET | Freq: Every day | ORAL | 3 refills | Status: DC

## 2021-06-10 MED ORDER — METHYLPHENIDATE HCL 10 MG PO TABS: 10.0000 mg | ORAL_TABLET | Freq: Every day | ORAL | 0 refills | Status: DC

## 2021-06-10 NOTE — Patient Instructions (Signed)
Continue Pristiq 100 mg daily  Continue quetiapine 25 mg at night Continue buspirone 30 mg at night Continue Zaleplon 20 mg at night as needed for sleep Continue Ritalin 10 mg daily  Next appointment: 3/9 at 8:20, video

## 2021-06-21 ENCOUNTER — Other Ambulatory Visit: Payer: Self-pay

## 2021-06-21 ENCOUNTER — Ambulatory Visit (INDEPENDENT_AMBULATORY_CARE_PROVIDER_SITE_OTHER): Payer: 59 | Admitting: Licensed Clinical Social Worker

## 2021-06-21 DIAGNOSIS — F3341 Major depressive disorder, recurrent, in partial remission: Secondary | ICD-10-CM

## 2021-06-21 NOTE — Plan of Care (Signed)
°  Problem: Decrease depressive symptoms and improve levels of effective functioning Goal: LTG: Reduce frequency, intensity, and duration of depression symptoms as evidenced by: SSB input needed on appropriate metric Outcome: Progressing Goal: STG: Paityn WILL PARTICIPATE IN AT LEAST 80% OF SCHEDULED INDIVIDUAL PSYCHOTHERAPY SESSIONS Outcome: Progressing   
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
## 2021-07-23 ENCOUNTER — Other Ambulatory Visit: Payer: Self-pay

## 2021-07-23 ENCOUNTER — Ambulatory Visit (INDEPENDENT_AMBULATORY_CARE_PROVIDER_SITE_OTHER): Payer: 59 | Admitting: Licensed Clinical Social Worker

## 2021-07-23 DIAGNOSIS — F3341 Major depressive disorder, recurrent, in partial remission: Secondary | ICD-10-CM | POA: Diagnosis not present

## 2021-07-23 DIAGNOSIS — F411 Generalized anxiety disorder: Secondary | ICD-10-CM

## 2021-07-23 NOTE — Progress Notes (Signed)
Virtual Visit via Video Note  I connected with Martha [name] on 07/23/21 at  8:00 AM EST by a video enabled telemedicine application and verified that I am speaking with the correct person using two identifiers.  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 The patient expressed understanding and agreed to proceed.   I discussed the assessment and treatment plan with the patient. The patient was provided an opportunity to ask questions and all were answered. The patient agreed with the plan and demonstrated an understanding of the instructions.   The patient was advised to call back or seek an in-person evaluation if the symptoms worsen or if the condition fails to improve as anticipated.  I provided 30 minutes of non-face-to-face time during this encounter.   Martha [name] Martha Deyanira Fesler, LCSW   THERAPIST PROGRESS NOTE  Session Time: 8-830a  Participation Level: None  Behavioral Response: Neat and Well GroomedAlertAnxious  Type of Therapy: Individual Therapy  Treatment Goals addressed: Problem: Decrease depressive symptoms and improve levels of effective functioning Goal: LTG: Reduce frequency, intensity, and duration of depression symptoms as evidenced by: SSB input needed on appropriate metric Outcome: Progressing Goal: STG: Martha [name] WILL PARTICIPATE IN AT LEAST 80% OF SCHEDULED INDIVIDUAL PSYCHOTHERAPY SESSIONS Outcome: Progressing  ProgressTowards Goals: Progressing  Interventions: CBT  Summary: Martha [name] is a 26 y.o. female who presents with improving symptoms related to depression and anxiety. Pt reports that overall mood has been stable and that she is managing stress and anxiety well. Pt reports good quality and quantity of sleep. Pt is going to bed earlier since she goes to work early in the morning.   Allowed pt to explore and  express thoughts and feelings associated with recent life situations and external stressors. Pt reports that currently she is worried about the health of her father. Pt reports her father recently had a colonoscopy and they found polyps.  Pt also expresses concern about her own health--needs to make another appt with GI doctor. Pt reports that she was upset on Valentines day when a relative made a comment about pt needing a partner.  Explored pts thoughts on this and pt does not feel inclined to find a partner at all presently. Validated pts feelings and supported her unconditionally. Encouraged continuing self care and self exploration as she seeks/finds inner peace. Pt reports that new job is going well and pt is enjoying her colleagues and the work itself.   Continued recommendations are as follows: self care behaviors, positive social engagements, focusing on overall work/home/life balance, and focusing on positive physical and emotional wellness.   Suicidal/Homicidal: No  Therapist Response: Pt is continuing to apply interventions learned in session into daily life situations. Pt is currently on track to meet goals utilizing interventions mentioned above. Personal growth and progress noted. Treatment to continue as indicated.   Plan: Return again in 4 weeks.  Diagnosis: Recurrent major depressive disorder, in partial remission (HCC)  Generalized anxiety disorder  Collaboration of Care: Other pt encouraged to continue psychiatric services with current provider, Dr. Vanetta Shawl  Patient/Guardian was advised Release of Information must be obtained prior to any record release in order to collaborate their care with an outside provider. Patient/Guardian was advised if they have not already done so to contact the registration department to sign all necessary forms in order for [place] to release information regarding their care.  Consent: Patient/Guardian gives verbal consent for treatment and assignment of  benefits for services provided during this visit. Patient/Guardian expressed understanding and agreed to proceed.   Martha [name] Martha Bartolome, LCSW 07/23/2021

## 2021-07-24 NOTE — Plan of Care (Signed)
°  Problem: Decrease depressive symptoms and improve levels of effective functioning Goal: LTG: Reduce frequency, intensity, and duration of depression symptoms as evidenced by: SSB input needed on appropriate metric Outcome: Progressing Goal: STG: Martha [name] WILL PARTICIPATE IN AT LEAST 80% OF SCHEDULED INDIVIDUAL PSYCHOTHERAPY SESSIONS Outcome: Progressing Intervention: Encourage new environment or opportunities for social interaction Intervention: Encourage verbalization of feelings/concerns/expectations Intervention: Freight forwarder and behavior Intervention: Encourage self-care activities

## 2021-08-04 NOTE — Progress Notes (Addendum)
Virtual Visit via Video Note  I connected with Martha [name] on 08/05/21 at  8:20 AM EST by a video enabled telemedicine application and verified that I am speaking with the correct person using two identifiers.  Location: Patient: home Provider: office Persons participated in the visit- patient, provider    I discussed the limitations of evaluation and management by telemedicine and the availability of in person appointments. The patient expressed understanding and agreed to proceed.      I discussed the assessment and treatment plan with the patient. The patient was provided an opportunity to ask questions and all were answered. The patient agreed with the plan and demonstrated an understanding of the instructions.   The patient was advised to call back or seek an in-person evaluation if the symptoms worsen or if the condition fails to improve as anticipated.  I provided 16 minutes of non-face-to-face time during this encounter.   Norman Clay, MD   [facility] MD/PA/NP OP Progress Note  08/05/2021 9:02 AM Martha [name]  MRN:  [id number]  Chief Complaint:  Chief Complaint  Patient presents with   Follow-up   Depression   ADHD   HPI:  This is a follow-up appointment for depression and ADHD.  She states that she has been doing fine until yesterday, when she had a panic attack.  She had chest pain, and she was scared, thinking about her family history of her grandfather, who died from MI.  She had SI at that time, although it subsided afterwards.  She was seen by PCP for her physical condition, and is also planning to contact the provider today again.  She thinks she might have bronchitis.  She has been doing well at work.  Although she occasionally feels stressed with some patients, she tries not to dwell on it.  She tends to stay in the house, playing video games on weekends.  Although she did go out to see some music show with her brother and her sister in law, she did not enjoy.  She  does not have much motivation to go outside.  She sleeps well most of the time.  She has fair energy.  She denies change in appetite.  She denies SI.  She feels anxious since yesterday.  She drank 1 drink the other day.  She denies drug use.  She feels comfortable to stay on the current medication regimen.   Visit Diagnosis:    ICD-10-CM   1. Recurrent major depressive disorder, in partial remission ([place])  F33.41     2. Insomnia, unspecified type  G47.00     3. Attention deficit hyperactivity disorder (ADHD), unspecified ADHD type  F90.9       Past Psychiatric History: Please see initial evaluation for full details. I have reviewed the history. No updates at this time.     Past Medical History:  Past Medical History:  Diagnosis Date   ADHD (attention deficit hyperactivity disorder)    no meds taken   Anxiety    Diabetes mellitus    pre-diabetic,told when younger   Family history of breast cancer    12/20 cancer genetic testing letter sent   Headache(784.0)    chronic migraines, decrease since BCP started   Idiopathic intracranial hypertension    Insomnia    Irregular menstrual cycle    pt states she started BCP,unsure of name to help regulate cycle   Obesity    Vision abnormalities    near sighted,wears glasses  Past Surgical History:  Procedure Laterality Date   CHOLECYSTECTOMY     TONSILLECTOMY     at 26yo    Family Psychiatric History: Please see initial evaluation for full details. I have reviewed the history. No updates at this time.     Family History:  Family History  Problem Relation Age of Onset   Thyroid cancer Mother 27   Depression Mother    Leukemia Paternal Aunt    Breast cancer Paternal Aunt 18   Skin cancer Maternal Grandfather    Anxiety disorder Father     Social History:  Social History   Socioeconomic History   Marital status: Single    Spouse name: Not on file   Number of children: Not on file   Years of education: Not on file    Highest education level: Not on file  Occupational History   Occupation: Consulting civil engineer    Comment: [employer] 12th grade  Tobacco Use   Smoking status: Never   Smokeless tobacco: Never  Vaping Use   Vaping Use: Some days  Substance and Sexual Activity   Alcohol use: Yes    Comment: occ   Drug use: No   Sexual activity: Not Currently    Partners: Male    Birth control/protection: Pill  Other Topics Concern   Not on file  Social History Narrative   Not on file   Social Determinants of Health   Financial Resource Strain: Not on file  Food Insecurity: Not on file  Transportation Needs: Not on file  Physical Activity: Not on file  Stress: Not on file  Social Connections: Not on file    Allergies:  Allergies  Allergen Reactions   Lamisil [Terbinafine]     Metabolic Disorder Labs: Lab Results  Component Value Date   HGBA1C 6.5 (H) 04/19/2021   No results found for: PROLACTIN Lab Results  Component Value Date   CHOL 151 04/20/2020   TRIG 136 04/20/2020   HDL 40 04/20/2020   CHOLHDL 3.8 04/20/2020   LDLCALC 87 04/20/2020   Lab Results  Component Value Date   TSH 6.670 (H) 04/20/2020   TSH 2.405 02/23/2015    Therapeutic Level Labs: No results found for: LITHIUM No results found for: VALPROATE No components found for:  CBMZ  Current Medications: Current Outpatient Medications  Medication Sig Dispense Refill   aspirin-acetaminophen-caffeine (EXCEDRIN MIGRAINE) 250-250-65 MG per tablet Take 2 tablets by mouth every 6 (six) hours as needed. For migraines.      busPIRone (BUSPAR) 30 MG tablet Take 1 tablet (30 mg total) by mouth at bedtime. 30 tablet 3   cetirizine (ZYRTEC) 10 MG tablet TK 1 T PO BID     Cholecalciferol (VITAMIN D-1000 MAX ST) 25 MCG (1000 UT) tablet Take by mouth.     desvenlafaxine (PRISTIQ) 100 MG 24 hr tablet Take 1 tablet (100 mg total) by mouth daily. 30 tablet 5   hydrochlorothiazide (MICROZIDE) 12.5 MG capsule Take 12.5 mg by mouth  daily.     levonorgestrel-ethinyl estradiol (INTROVALE) 0.15-0.03 MG tablet Take 1 tablet by mouth daily. 91 tablet 4   meloxicam (MOBIC) 15 MG tablet TK 1 T PO ONCE A DAY  2   methylphenidate (RITALIN) 10 MG tablet Take 1 tablet (10 mg total) by mouth daily before breakfast. 30 tablet 0   [START ON 09/09/2021] methylphenidate (RITALIN) 10 MG tablet Take 1 tablet (10 mg total) by mouth daily before breakfast. 30 tablet 0   Multiple Vitamin (MULTI-VITAMIN) tablet  Take by mouth.     pantoprazole (PROTONIX) 40 MG tablet   11   QUEtiapine (SEROQUEL) 25 MG tablet Take 1 tablet (25 mg total) by mouth at bedtime. 30 tablet 3   spironolactone (ALDACTONE) 100 MG tablet TK 1 T PO QD B NOON  9   vitamin B-12 (CYANOCOBALAMIN) 1000 MCG tablet Take by mouth.     [START ON 08/10/2021] zaleplon (SONATA) 10 MG capsule Take 2 capsules (20 mg total) by mouth at bedtime. 60 capsule 1   No current facility-administered medications for this visit.     Musculoskeletal: Strength & Muscle Tone:  N/A Gait & Station:  N/A Patient leans: N/A  Psychiatric Specialty Exam: Review of Systems  Psychiatric/Behavioral:  Negative for agitation, behavioral problems, confusion, decreased concentration, dysphoric mood, hallucinations, self-injury, sleep disturbance and suicidal ideas. The patient is nervous/anxious. The patient is not hyperactive.   All other systems reviewed and are negative.  There were no vitals taken for this visit.There is no height or weight on file to calculate BMI.  General Appearance: Fairly Groomed  Eye Contact:  Good  Speech:  Clear and Coherent  Volume:  Normal  Mood:  Anxious  Affect:  Appropriate, Congruent, and slightly down  Thought Process:  Coherent  Orientation:  Full (Time, Place, and Person)  Thought Content: Logical   Suicidal Thoughts:  No  Homicidal Thoughts:  No  Memory:  Immediate;   Good  Judgement:  Good  Insight:  Good  Psychomotor Activity:  Normal  Concentration:   Concentration: Good and Attention Span: Good  Recall:  Good  Fund of Knowledge: Good  Language: Good  Akathisia:  No  Handed:  Right  AIMS (if indicated): not done  Assets:  Communication Skills Desire for Improvement  ADL's:  Intact  Cognition: WNL  Sleep:  Fair   Screenings: GAD-7    Flowsheet Row Office Visit from 04/19/2021 in [facility] Office Visit from 04/16/2020 in [facility] Office Visit from 04/15/2019 in [facility]  Total GAD-7 Score 7 7 10       PHQ2-9    [place] from 06/21/2021 in [place] Office Visit from 04/19/2021 in [facility] Video Visit from 02/03/2021 in [place] from 01/25/2021 in [place] from 11/11/2020 in [place]  PHQ-2 Total Score 1 2 2  0 1  PHQ-9 Total Score -- 11 12 -- --      Flowsheet Row Counselor from 06/21/2021 in [place] Video Visit from 02/03/2021 in [place] Counselor from 01/25/2021 in [place] No Risk No Risk No Risk        Assessment and Plan:  Martha [name] is a 26 y.o. year old female with a history of depression, anxiety, ADHD by history, migraine, GERD, who presents for follow up appointment for below.   1. Recurrent major depressive disorder, in partial remission ([place]) Although she did had a panic attack and depressed mood yesterday in the context of having chest pain/panic attack, she denies any other mood symptoms otherwise since the last visit.  Psychosocial stressors includes loss of her friends, family including her aunt, financial strain, conflict with her father, medical condition of her grandmother with Alzheimer disease.  Will continue Pristiq to target depression.  Will continue quetiapine as adjunctive  treatment for depression.  Will continue BuSpar for anxiety.   2. Insomnia,  unspecified type Improving.  Will continue zaleplon as needed for insomnia. Noted that she has pending sleep evaluation after the payment is completed.   3. Attention deficit hyperactivity disorder (ADHD), unspecified ADHD type Neuropsych testing was consistent with ADHD.  She reports good benefit from Ritalin.  We will continue current dose to target ADHD.    Plan  Continue Pristiq 100 mg daily  Continue quetiapine 25 mg at night Continue buspirone 30 mg at night Continue Zaleplon 20 mg at night as needed for sleep Continue Ritalin 10 mg daily  Next appointment: 5/11 at 11 AM for 30 mins, in person   This clinician has discussed the side effect associated with medication prescribed during this encounter. Please refer to notes in the previous encounters for more details.     Past trials of medication: sertraline, Pristiq, bupropion (some adverse reaction in the past), Abilify (visual change)     The patient demonstrates the following risk factors for suicide: Chronic risk factors for suicide include: psychiatric disorder of depression, anxiety. Acute risk factors for suicide include: family or marital conflict. Protective factors for this patient include: positive social support, coping skills and hope for the future. Considering these factors, the overall suicide risk at this point appears to be low. Patient is appropriate for outpatient follow up.   Collaboration of Care: Collaboration of Care: Other N/A   Consent: Patient/Guardian gives verbal consent for treatment and assignment of benefits for services provided during this visit. Patient/Guardian expressed understanding and agreed to proceed.   Discussed with the patient that upcoming regulatory changes in Telehealth. The patient verbalized understanding that controlled substance will not be prescribed after May 11 th unless the patient is able to do in person  visit.    Norman Clay, MD 08/05/2021, 9:02 AM

## 2021-08-05 ENCOUNTER — Telehealth (INDEPENDENT_AMBULATORY_CARE_PROVIDER_SITE_OTHER): Payer: 59 | Admitting: Psychiatry

## 2021-08-05 ENCOUNTER — Other Ambulatory Visit: Payer: Self-pay

## 2021-08-05 ENCOUNTER — Encounter: Payer: Self-pay | Admitting: Psychiatry

## 2021-08-05 DIAGNOSIS — F3341 Major depressive disorder, recurrent, in partial remission: Secondary | ICD-10-CM

## 2021-08-05 DIAGNOSIS — F909 Attention-deficit hyperactivity disorder, unspecified type: Secondary | ICD-10-CM | POA: Diagnosis not present

## 2021-08-05 DIAGNOSIS — G4700 Insomnia, unspecified: Secondary | ICD-10-CM | POA: Diagnosis not present

## 2021-08-05 MED ORDER — METHYLPHENIDATE HCL 10 MG PO TABS: 10.0000 mg | ORAL_TABLET | Freq: Every day | ORAL | 0 refills | Status: DC

## 2021-08-05 MED ORDER — ZALEPLON 10 MG PO CAPS: 20.0000 mg | ORAL_CAPSULE | Freq: Every day | ORAL | 1 refills | Status: DC

## 2021-08-05 NOTE — Patient Instructions (Signed)
Continue Pristiq 100 mg daily  ?Continue quetiapine 25 mg at night ?Continue buspirone 30 mg at night ?Continue Zaleplon 20 mg at night as needed for sleep ?Continue Ritalin 10 mg daily  ?Next appointment: 5/11 at 11 AM, in person ? ?The next visit will be in person visit. Please arrive 15 mins before the scheduled time.  ? ?[facility]  ?Address: [address], [place], [place] [address]   ?

## 2021-08-06 ENCOUNTER — Telehealth: Payer: Self-pay

## 2021-08-06 ENCOUNTER — Other Ambulatory Visit: Payer: Self-pay | Admitting: Psychiatry

## 2021-08-06 MED ORDER — DESVENLAFAXINE SUCCINATE ER 100 MG PO TB24: 100.0000 mg | ORAL_TABLET | Freq: Every day | ORAL | 0 refills | Status: DC

## 2021-08-06 MED ORDER — QUETIAPINE FUMARATE 25 MG PO TABS: 25.0000 mg | ORAL_TABLET | Freq: Every day | ORAL | 0 refills | Status: DC

## 2021-08-06 MED ORDER — BUSPIRONE HCL 30 MG PO TABS: 30.0000 mg | ORAL_TABLET | Freq: Every day | ORAL | 0 refills | Status: DC

## 2021-08-06 NOTE — Telephone Encounter (Signed)
received voicemail that [facility] ?

## 2021-08-06 NOTE — Telephone Encounter (Signed)
Ordered Pristiq, buspar, quetiapine to that pharmacy. Please notify her that other meds/controlled substance needs to be ordered for monthly refills only.

## 2021-08-09 NOTE — Telephone Encounter (Signed)
Closing note tried to call twice and no answer and I can not leave a voicemail.  So i'm closing the message.  ?

## 2021-08-11 ENCOUNTER — Telehealth: Payer: Self-pay

## 2021-08-11 NOTE — Telephone Encounter (Signed)
Decline -will only do monthly refill at this time

## 2021-08-11 NOTE — Telephone Encounter (Signed)
received fax requesting the [facility]  ?

## 2021-09-10 ENCOUNTER — Other Ambulatory Visit: Payer: Self-pay | Admitting: Student

## 2021-09-10 DIAGNOSIS — G43019 Migraine without aura, intractable, without status migrainosus: Secondary | ICD-10-CM

## 2021-09-17 ENCOUNTER — Ambulatory Visit (INDEPENDENT_AMBULATORY_CARE_PROVIDER_SITE_OTHER): Payer: 59 | Admitting: Licensed Clinical Social Worker

## 2021-09-17 DIAGNOSIS — F3341 Major depressive disorder, recurrent, in partial remission: Secondary | ICD-10-CM

## 2021-09-17 NOTE — Progress Notes (Signed)
Virtual Visit via Video Note ? ?I connected with [name] on 09/17/21 at  8:00 AM EDT by a video enabled telemedicine application and verified that I am speaking with the correct person using two identifiers. ? ?Location: ?Patient: work   ?Provider: remote office [place], [place]) ?  ?I discussed the limitations of evaluation and management by telemedicine and the availability of in person appointments. The patient expressed understanding and agreed to proceed. ?  ?I discussed the assessment and treatment plan with the patient. The patient was provided an opportunity to ask questions and all were answered. The patient agreed with the plan and demonstrated an understanding of the instructions. ?  ?The patient was advised to call back or seek an in-person evaluation if the symptoms worsen or if the condition fails to improve as anticipated. ? ?I provided 30 minutes of non-face-to-face time during this encounter. ? ? ?Kanani Mowbray R Gloriann Riede, LCSW ? ? ?THERAPIST PROGRESS NOTE ? ?Session Time: 8-830a ? ?Participation Level: None ? ?Behavioral Response: Neat and Well GroomedAlertAnxious ? ?Type of Therapy: Individual Therapy ? ?Treatment Goals addressed: Problem: Decrease depressive symptoms and improve levels of effective functioning ?Goal: LTG: Reduce frequency, intensity, and duration of depression symptoms as evidenced by: SSB input needed on appropriate metric ?Outcome: Progressing ?Goal: STG: El[place]na WILL PARTICIPATE IN AT LEAST 80% OF SCHEDULED INDIVIDUAL PSYCHOTHERAPY SESSIONS ?Outcome: Progressing ? ?ProgressTowards Goals: Progressing ? ?Interventions: CBT ? ?Summary: Martha [name] is a 26 y.o. female who presents with improving symptoms related to depression and anxiety. Pt reports that overall mood has been stable and that she is managing stress and anxiety well. Pt reports good quality and quantity of sleep.  ? ?Allowed pt to explore and express thoughts and feelings associated with recent life situations and  external stressors. Allowed patient to explore stress and anxiety associated with ongoing health related concerns. Patient reports that she's had something going on with her lungs, continuing to have gastrointestinal issues, having migraine headaches, joint pain, and worries about overall immune system. Patient reports that her anxiety about her overall health improves when she has appropriate action plans with the medical community to assess and treat conditions that she's worried about. Patient reports that she has multiple appointments with specialists and upcoming assessments including MRI, colonoscopy, and endoscopy. Patient reports that she is also getting a nerve block in her head for migraines. Patient reports that she has some anxiety associated with this procedure, but is focusing on the outcome. Patient reports that she is staying on top of all of her appointments and that she is asking appropriate questions of the medical professionals at her appointments. Discussed new role at new job, and patient feels that she is doing well at this time. Allow patient to explore some difficult scenarios that she has had to navigate through recently--and how this makes patient feel more empowered and confident in herself and her own abilities. Patient feels that she is doing a better job at using effective communication strategies at work. Patient admits that there are some situations that make her feel angry but she is learning how to navigate around her anger and communicate and express herself in a productive way while in the workplace. Discussed importance of self-care--patient reports that her brother has baby kittens, and playing with the kittens and socializing the kittens is part of her self-care presently. Patient reports that she does not feel depressed or overly stressed/anxious at this session. ? ?Continued recommendations are as follows: self care behaviors, positive  social engagements, focusing on overall  work/home/life balance, and focusing on positive physical and emotional wellness.  ? ?Suicidal/Homicidal: No ? ?Therapist Response: Pt is continuing to apply interventions learned in session into daily life situations. Pt is currently on track to meet goals utilizing interventions mentioned above. Personal growth and progress noted. Treatment to continue as indicated.  ? ?Plan: Return again in 4 weeks. ? ?Diagnosis: Recurrent major depressive disorder, in partial remission ([facility]) ? ?Collaboration of Care: Other pt encouraged to continue psychiatric services with current provider, Dr. Vanetta Shawl ? ?Patient/Guardian was advised Release of Information must be obtained prior to any record release in order to collaborate their care with an outside provider. Patient/Guardian was advised if they have not already done so to contact the registration department to sign all necessary forms in order for [place] to release information regarding their care.  ? ?Consent: Patient/Guardian gives verbal consent for treatment and assignment of benefits for services provided during this visit. Patient/Guardian expressed understanding and agreed to proceed.  ? ?Sanela Evola R Tyrie Porzio, LCSW ?09/17/2021 ? ?

## 2021-09-17 NOTE — Plan of Care (Signed)
?  Problem: Decrease depressive symptoms and improve levels of effective functioning Goal: LTG: Reduce frequency, intensity, and duration of depression symptoms as evidenced by: SSB input needed on appropriate metric Outcome: Progressing Goal: STG: Kahlani WILL PARTICIPATE IN AT LEAST 80% OF SCHEDULED INDIVIDUAL PSYCHOTHERAPY SESSIONS Outcome: Progressing   

## 2021-09-25 DIAGNOSIS — K529 Noninfective gastroenteritis and colitis, unspecified: Secondary | ICD-10-CM | POA: Insufficient documentation

## 2021-09-25 DIAGNOSIS — Z8371 Family history of colonic polyps: Secondary | ICD-10-CM | POA: Insufficient documentation

## 2021-09-25 DIAGNOSIS — R1312 Dysphagia, oropharyngeal phase: Secondary | ICD-10-CM | POA: Insufficient documentation

## 2021-09-28 ENCOUNTER — Other Ambulatory Visit: Payer: Self-pay | Admitting: Psychiatry

## 2021-09-29 ENCOUNTER — Telehealth: Payer: Self-pay

## 2021-09-29 ENCOUNTER — Other Ambulatory Visit: Payer: Self-pay | Admitting: Psychiatry

## 2021-09-29 MED ORDER — ZALEPLON 10 MG PO CAPS: 20.0000 mg | ORAL_CAPSULE | Freq: Every day | ORAL | 2 refills | Status: DC

## 2021-09-29 NOTE — Telephone Encounter (Signed)
Ordered

## 2021-09-29 NOTE — Telephone Encounter (Signed)
pt called states she needs refills on the sonata ?

## 2021-10-05 NOTE — Progress Notes (Signed)
BH MD/PA/NP OP Progress Note ? ?10/07/2021 11:39 AM ?Martha [name]  ?MRN:  [id number] ? ?Chief Complaint:  ?Chief Complaint  ?Patient presents with  ? Follow-up  ? Depression  ? ?HPI:  ?This is a follow-up appointment for depression, anxiety, insomnia and ADHD.  ?She states that she has an upcoming dental procedures.  She also needs to have colonoscopy for her GI symptoms.  She will get MRI for headache.  She had an episode of her feeling depressed last Saturday without any triggers.  She stayed in the bed.  She was doing fine the next day.  She states that this feeling of depressed has not happened for a while She states that people can be "pain" at the clinic.  She reports good relationship with her coworkers, and denies concern at this time.  She has depressive symptoms as in PHQ-9 including occasional diffiuculty in concentration. Although she reports passive SI, she denies any plan or intent.  She denies any change in appetite.  She is considering to discuss with her PCP about potential referral for weight loss. After having discussed treatment options,  she feels comfortable to stay on the current medication regimen at this time.  ? ?Wt Readings from Last 3 Encounters:  ?10/07/21 (!) 362 lb (164.2 kg)  ?04/19/21 (!) 352 lb 9.6 oz (159.9 kg)  ?04/16/20 (!) 352 lb 6.4 oz (159.8 kg)  ?  ?Visit Diagnosis:  ?  ICD-10-CM   ?1. Moderate episode of recurrent major depressive disorder (HCC)  F33.1   ?  ?2. Insomnia, unspecified type  G47.00   ?  ?3. Attention deficit hyperactivity disorder (ADHD), unspecified ADHD type  F90.9   ?  ? ? ?Past Psychiatric History: Please see initial evaluation for full details. I have reviewed the history. No updates at this time.  ?  ? ?Past Medical History:  ?Past Medical History:  ?Diagnosis Date  ? ADHD (attention deficit hyperactivity disorder)   ? no meds taken  ? Anxiety   ? Diabetes mellitus   ? pre-diabetic,told when younger  ? Family history of breast cancer   ? 12/20 cancer  genetic testing letter sent  ? Headache(784.0)   ? chronic migraines, decrease since BCP started  ? Idiopathic intracranial hypertension   ? Insomnia   ? Irregular menstrual cycle   ? pt states she started BCP,unsure of name to help regulate cycle  ? Obesity   ? Vision abnormalities   ? near sighted,wears glasses  ?  ?Past Surgical History:  ?Procedure Laterality Date  ? CHOLECYSTECTOMY    ? TONSILLECTOMY    ? at 26yo  ? ? ?Family Psychiatric History: Please see initial evaluation for full details. I have reviewed the history. No updates at this time.  ?  ? ?Family History:  ?Family History  ?Problem Relation Age of Onset  ? Thyroid cancer Mother 20  ? Depression Mother   ? Anxiety disorder Father   ? Leukemia Paternal Aunt   ? Breast cancer Paternal Aunt 40  ? Skin cancer Maternal Grandfather   ? ? ?Social History:  ?Social History  ? ?Socioeconomic History  ? Marital status: Single  ?  Spouse name: Not on file  ? Number of children: Not on file  ? Years of education: Not on file  ? Highest education level: Not on file  ?Occupational History  ? Occupation: Consulting civil engineer  ?  Comment: [employer] 12th grade  ?Tobacco Use  ? Smoking status: Never  ? Smokeless tobacco:  Never  ?Vaping Use  ? Vaping Use: Some days  ?Substance and Sexual Activity  ? Alcohol use: Yes  ?  Comment: occ  ? Drug use: No  ? Sexual activity: Not Currently  ?  Partners: Male  ?  Birth control/protection: Pill  ?Other Topics Concern  ? Not on file  ?Social History Narrative  ? Not on file  ? ?Social Determinants of Health  ? ?Financial Resource Strain: Not on file  ?Food Insecurity: Not on file  ?Transportation Needs: Not on file  ?Physical Activity: Not on file  ?Stress: Not on file  ?Social Connections: Not on file  ? ? ?Allergies:  ?Allergies  ?Allergen Reactions  ? Lamisil [Terbinafine]   ? ? ?Metabolic Disorder Labs: ?Lab Results  ?Component Value Date  ? HGBA1C 6.5 (H) 04/19/2021  ? ?No results found for: PROLACTIN ?Lab Results  ?Component  Value Date  ? CHOL 151 04/20/2020  ? TRIG 136 04/20/2020  ? HDL 40 04/20/2020  ? CHOLHDL 3.8 04/20/2020  ? LDLCALC 87 04/20/2020  ? ?Lab Results  ?Component Value Date  ? TSH 6.670 (H) 04/20/2020  ? TSH 2.405 02/23/2015  ? ? ?Therapeutic Level Labs: ?No results found for: LITHIUM ?No results found for: VALPROATE ?No components found for:  CBMZ ? ?Current Medications: ?Current Outpatient Medications  ?Medication Sig Dispense Refill  ? cetirizine (ZYRTEC) 10 MG tablet TK 1 T PO BID    ? Cholecalciferol (VITAMIN D-1000 MAX ST) 25 MCG (1000 UT) tablet Take by mouth.    ? colestipol (COLESTID) 1 g tablet Take by mouth.    ? hydrochlorothiazide (MICROZIDE) 12.5 MG capsule Take 12.5 mg by mouth daily.    ? levonorgestrel-ethinyl estradiol (INTROVALE) 0.15-0.03 MG tablet Take 1 tablet by mouth daily. 91 tablet 4  ? levothyroxine (SYNTHROID) 50 MCG tablet Take by mouth.    ? metFORMIN (GLUCOPHAGE) 500 MG tablet Take by mouth.    ? [START ON 12/04/2021] methylphenidate (RITALIN) 10 MG tablet Take 1 tablet (10 mg total) by mouth daily. 30 tablet 0  ? Multiple Vitamin (MULTI-VITAMIN) tablet Take by mouth.    ? Na Sulfate-K Sulfate-Mg Sulf 17.5-3.13-1.6 GM/177ML SOLN Take by mouth.    ? omeprazole (PRILOSEC OTC) 20 MG tablet Take by mouth.    ? spironolactone (ALDACTONE) 100 MG tablet TK 1 T PO QD B NOON  9  ? vitamin B-12 (CYANOCOBALAMIN) 1000 MCG tablet Take by mouth.    ? zaleplon (SONATA) 10 MG capsule Take 2 capsules (20 mg total) by mouth at bedtime. 60 capsule 2  ? [START ON 11/05/2021] busPIRone (BUSPAR) 30 MG tablet Take 1 tablet (30 mg total) by mouth at bedtime. 90 tablet 0  ? [START ON 11/05/2021] desvenlafaxine (PRISTIQ) 100 MG 24 hr tablet Take 1 tablet (100 mg total) by mouth daily. 90 tablet 1  ? [START ON 11/07/2021] methylphenidate (RITALIN) 10 MG tablet Take 1 tablet (10 mg total) by mouth daily before breakfast. 30 tablet 0  ? [START ON 11/05/2021] QUEtiapine (SEROQUEL) 25 MG tablet Take 1 tablet (25 mg total) by  mouth at bedtime. 90 tablet 0  ? ?No current facility-administered medications for this visit.  ? ? ? ?Musculoskeletal: ?Strength & Muscle Tone:  normal ?Gait & Station: normal ?Patient leans: N/A ? ?Psychiatric Specialty Exam: ?Review of Systems  ?Psychiatric/Behavioral:  Positive for decreased concentration, dysphoric mood, sleep disturbance and suicidal ideas. Negative for agitation, behavioral problems, confusion, hallucinations and self-injury. The patient is nervous/anxious. The patient is not hyperactive.   ?  All other systems reviewed and are negative.  ?Blood pressure (!) 143/89, pulse 81, temperature 98.2 ?F (36.8 ?C), temperature source Oral, weight (!) 362 lb (164.2 kg).Body mass index is 53.46 kg/m?.  ?General Appearance: Fairly Groomed  ?Eye Contact:  Good  ?Speech:  Clear and Coherent  ?Volume:  Normal  ?Mood:   fine  ?Affect:  Appropriate, Congruent, and calm  ?Thought Process:  Coherent  ?Orientation:  Full (Time, Place, and Person)  ?Thought Content: Logical   ?Suicidal Thoughts:  Yes.  without intent/plan  ?Homicidal Thoughts:  No  ?Memory:  Immediate;   Good  ?Judgement:  Good  ?Insight:  Good  ?Psychomotor Activity:  Normal, no tremors, no rigidity  ?Concentration:  Concentration: Good and Attention Span: Good  ?Recall:  Good  ?Fund of Knowledge: Good  ?Language: Good  ?Akathisia:  No  ?Handed:  Right  ?AIMS (if indicated): not done  ?Assets:  Communication Skills ?Desire for Improvement  ?ADL's:  Intact  ?Cognition: WNL  ?Sleep:  Fair  ? ?Screenings: ?GAD-7   ? ?Flowsheet Row Office Visit from 04/19/2021 in [facility] Office Visit from 04/16/2020 in [facility] Office Visit from 04/15/2019 in [facility]  ?Total GAD-7 Score 7 7 10   ? ?  ? ?PHQ2-9   ? ?Flowsheet Row Office Visit from 10/07/2021 in [facility] Counselor from 06/21/2021 in [facility] Office Visit from 04/19/2021 in [facility]  Video Visit from 02/03/2021 in [facility] Counselor from 01/25/2021 in [facility]  ?PHQ-2 Total Score 2 1 2 2  0  ?PHQ-9 Total Score 12 -- 11 12 --  ? ?  ? ?Fl

## 2021-10-07 ENCOUNTER — Encounter: Payer: Self-pay | Admitting: Psychiatry

## 2021-10-07 ENCOUNTER — Ambulatory Visit (INDEPENDENT_AMBULATORY_CARE_PROVIDER_SITE_OTHER): Payer: 59 | Admitting: Psychiatry

## 2021-10-07 VITALS — BP 143/89 | HR 81 | Temp 98.2°F | Wt 362.0 lb

## 2021-10-07 DIAGNOSIS — G4700 Insomnia, unspecified: Secondary | ICD-10-CM | POA: Diagnosis not present

## 2021-10-07 DIAGNOSIS — F331 Major depressive disorder, recurrent, moderate: Secondary | ICD-10-CM | POA: Diagnosis not present

## 2021-10-07 DIAGNOSIS — F909 Attention-deficit hyperactivity disorder, unspecified type: Secondary | ICD-10-CM | POA: Diagnosis not present

## 2021-10-07 DIAGNOSIS — G43909 Migraine, unspecified, not intractable, without status migrainosus: Secondary | ICD-10-CM | POA: Insufficient documentation

## 2021-10-07 MED ORDER — METHYLPHENIDATE HCL 10 MG PO TABS
10.0000 mg | ORAL_TABLET | Freq: Every day | ORAL | 0 refills | Status: DC
Start: 2021-12-04 — End: 2021-11-16

## 2021-10-07 MED ORDER — QUETIAPINE FUMARATE 25 MG PO TABS: 25.0000 mg | ORAL_TABLET | Freq: Every day | ORAL | 0 refills | Status: DC

## 2021-10-07 MED ORDER — METHYLPHENIDATE HCL 10 MG PO TABS: 10.0000 mg | ORAL_TABLET | Freq: Every day | ORAL | 0 refills | Status: DC

## 2021-10-07 MED ORDER — BUSPIRONE HCL 30 MG PO TABS: 30.0000 mg | ORAL_TABLET | Freq: Every day | ORAL | 0 refills | Status: AC

## 2021-10-07 MED ORDER — DESVENLAFAXINE SUCCINATE ER 100 MG PO TB24: 100.0000 mg | ORAL_TABLET | Freq: Every day | ORAL | 1 refills | Status: DC

## 2021-10-07 NOTE — Patient Instructions (Signed)
Continue Pristiq 100 mg daily  ?Continue quetiapine 25 mg at night ?Continue buspirone 30 mg at night ?Continue Zaleplon 20 mg at night as needed for sleep ?Continue Ritalin 10 mg daily  ?Next appointment: 7/11 at 11 AM, in person ?

## 2021-10-29 ENCOUNTER — Encounter: Payer: Self-pay | Admitting: Oncology

## 2021-10-29 ENCOUNTER — Inpatient Hospital Stay: Payer: Managed Care, Other (non HMO)

## 2021-10-29 ENCOUNTER — Inpatient Hospital Stay: Payer: Managed Care, Other (non HMO) | Attending: Oncology | Admitting: Oncology

## 2021-10-29 DIAGNOSIS — Z87891 Personal history of nicotine dependence: Secondary | ICD-10-CM | POA: Diagnosis not present

## 2021-10-29 DIAGNOSIS — Z808 Family history of malignant neoplasm of other organs or systems: Secondary | ICD-10-CM | POA: Diagnosis not present

## 2021-10-29 DIAGNOSIS — R233 Spontaneous ecchymoses: Secondary | ICD-10-CM | POA: Insufficient documentation

## 2021-10-29 DIAGNOSIS — D720 Genetic anomalies of leukocytes: Secondary | ICD-10-CM | POA: Insufficient documentation

## 2021-10-29 DIAGNOSIS — D729 Disorder of white blood cells, unspecified: Secondary | ICD-10-CM

## 2021-10-29 DIAGNOSIS — Z806 Family history of leukemia: Secondary | ICD-10-CM | POA: Insufficient documentation

## 2021-10-29 DIAGNOSIS — Z803 Family history of malignant neoplasm of breast: Secondary | ICD-10-CM | POA: Insufficient documentation

## 2021-10-29 LAB — CBC WITH DIFFERENTIAL/PLATELET
Abs Immature Granulocytes: 0.03 10*3/uL (ref 0.00–0.07)
Basophils Absolute: 0 10*3/uL (ref 0.0–0.1)
Basophils Relative: 0 %
Eosinophils Absolute: 0.2 10*3/uL (ref 0.0–0.5)
Eosinophils Relative: 1 %
HCT: 38 % (ref 36.0–46.0)
Hemoglobin: 12.3 g/dL (ref 12.0–15.0)
Immature Granulocytes: 0 %
Lymphocytes Relative: 29 %
Lymphs Abs: 3.6 10*3/uL (ref 0.7–4.0)
MCH: 28.7 pg (ref 26.0–34.0)
MCHC: 32.4 g/dL (ref 30.0–36.0)
MCV: 88.6 fL (ref 80.0–100.0)
Monocytes Absolute: 0.7 10*3/uL (ref 0.1–1.0)
Monocytes Relative: 5 %
Neutro Abs: 8.1 10*3/uL — ABNORMAL HIGH (ref 1.7–7.7)
Neutrophils Relative %: 65 %
Platelets: 322 10*3/uL (ref 150–400)
RBC: 4.29 MIL/uL (ref 3.87–5.11)
RDW: 12.9 % (ref 11.5–15.5)
WBC: 12.6 10*3/uL — ABNORMAL HIGH (ref 4.0–10.5)
nRBC: 0 % (ref 0.0–0.2)

## 2021-10-29 LAB — PROTIME-INR
INR: 0.9 (ref 0.8–1.2)
Prothrombin Time: 12.5 seconds (ref 11.4–15.2)

## 2021-10-29 LAB — TECHNOLOGIST SMEAR REVIEW
Plt Morphology: NORMAL
RBC MORPHOLOGY: NORMAL
WBC MORPHOLOGY: NORMAL

## 2021-10-29 LAB — APTT: aPTT: 25 seconds (ref 24–36)

## 2021-10-29 LAB — PLATELET FUNCTION ASSAY: Collagen / Epinephrine: 99 seconds (ref 0–193)

## 2021-10-29 NOTE — Progress Notes (Signed)
Hematology/Oncology Consult note [facility] Telephone:([phone] Fax:([phone]) [phone]  Patient Care Team: Cleophas Dunker as PCP - General (Family Medicine)   Name of the patient: Martha [name]  [id number]  June 05, 1995    Reason for referral-leukocytosis   Referring physician-Jason Venetia Maxon, PA  Date of visit: 10/29/21   History of presenting illness- Patient is a 26 year old female with a past medical history significant for migraine headaches, depression, anxiety, GERD among other medical problems.  She has been referred to [place] for leukocytosis.  Her CBC from 09/07/2021 showed white cell count of 12.1, H&H of 12.4/37.5.  ESR elevated at 45.  TSH normal at 2.5.  Of note looking at her prior CBCs patient has had white cell count that fluctuates between 10-13 over the last 6 years.  Differential is mainly showed neutrophilia.  Patient reports some easy bruising but has undergone surgeries in the past without any bleeding complications.  Denies any gum bleeds or nosebleeds.  ECOG PS- 0  Pain scale- 0   Review of systems- Review of Systems  Constitutional:  Negative for chills, fever, malaise/fatigue and weight loss.  HENT:  Negative for congestion, ear discharge and nosebleeds.   Eyes:  Negative for blurred vision.  Respiratory:  Negative for cough, hemoptysis, sputum production, shortness of breath and wheezing.   Cardiovascular:  Negative for chest pain, palpitations, orthopnea and claudication.  Gastrointestinal:  Negative for abdominal pain, blood in stool, constipation, diarrhea, heartburn, melena, nausea and vomiting.  Genitourinary:  Negative for dysuria, flank pain, frequency, hematuria and urgency.  Musculoskeletal:  Negative for back pain, joint pain and myalgias.  Skin:  Negative for rash.  Neurological:  Negative for dizziness, tingling, focal weakness, seizures, weakness and headaches.  Endo/Heme/Allergies:  Bruises/bleeds easily.   Psychiatric/Behavioral:  Negative for depression and suicidal ideas. The patient does not have insomnia.     Allergies  Allergen Reactions   Lamisil [Terbinafine]     Patient Active Problem List   Diagnosis Date Noted   Migraine headache 10/07/2021   Oropharyngeal dysphagia 09/25/2021   FH: colon polyps 09/25/2021   Chronic diarrhea of unknown origin 09/25/2021   Recurrent major depressive disorder, in partial remission ([place]) 02/04/2021   Edema of both legs 02/04/2021   Seasonal allergic rhinitis due to pollen 08/28/2018   Hiatal hernia 10/07/2015   GERD without esophagitis 07/06/2015   White coat syndrome without diagnosis of hypertension 07/06/2015   Morbid obesity ([place]) 02/10/2011     Past Medical History:  Diagnosis Date   ADHD (attention deficit hyperactivity disorder)    no meds taken   Allergy    Anxiety    Diabetes mellitus    pre-diabetic,told when younger   Family history of breast cancer    12/20 cancer genetic testing letter sent   Headache(784.0)    chronic migraines, decrease since BCP started   Idiopathic intracranial hypertension    Insomnia    Irregular menstrual cycle    pt states she started BCP,unsure of name to help regulate cycle   Obesity    Vision abnormalities    near sighted,wears glasses     Past Surgical History:  Procedure Laterality Date   CHOLECYSTECTOMY     TONSILLECTOMY     at 26yo    Social History   Socioeconomic History   Marital status: Single    Spouse name: Not on file   Number of children: Not on file   Years of education: Not on file   Highest  education level: Not on file  Occupational History   Occupation: Ship broker    Comment: [employer] 12th grade  Tobacco Use   Smoking status: Never   Smokeless tobacco: Never  Vaping Use   Vaping Use: Former   Quit date: 10/30/2018  Substance and Sexual Activity   Alcohol use: Yes    Comment: occ   Drug use: No   Sexual activity: Not Currently    Partners: Male     Birth control/protection: Pill  Other Topics Concern   Not on file  Social History Narrative   Not on file   Social Determinants of Health   Financial Resource Strain: Not on file  Food Insecurity: Not on file  Transportation Needs: Not on file  Physical Activity: Not on file  Stress: Not on file  Social Connections: Not on file  Intimate Partner Violence: Not on file     Family History  Problem Relation Age of Onset   Thyroid cancer Mother 49   Depression Mother    Hypertension Father    Anxiety disorder Father    Leukemia Paternal Aunt    Breast cancer Paternal Aunt 51   Skin cancer Maternal Grandfather    Alzheimer's disease Maternal Grandfather    Alzheimer's disease Paternal Grandmother      Current Outpatient Medications:    [START ON 11/05/2021] busPIRone (BUSPAR) 30 MG tablet, Take 1 tablet (30 mg total) by mouth at bedtime., Disp: 90 tablet, Rfl: 0   cetirizine (ZYRTEC) 10 MG tablet, Take 10 mg by mouth daily., Disp: , Rfl:    Cholecalciferol (VITAMIN D-1000 MAX ST) 25 MCG (1000 UT) tablet, Take 1,000 Units by mouth daily., Disp: , Rfl:    [START ON 11/05/2021] desvenlafaxine (PRISTIQ) 100 MG 24 hr tablet, Take 1 tablet (100 mg total) by mouth daily., Disp: 90 tablet, Rfl: 1   hydrochlorothiazide (MICROZIDE) 12.5 MG capsule, Take 12.5 mg by mouth daily., Disp: , Rfl:    levonorgestrel-ethinyl estradiol (INTROVALE) 0.15-0.03 MG tablet, Take 1 tablet by mouth daily., Disp: 91 tablet, Rfl: 4   levothyroxine (SYNTHROID) 50 MCG tablet, Take 50 mcg by mouth daily before breakfast., Disp: , Rfl:    metFORMIN (GLUCOPHAGE) 500 MG tablet, Take 250 mg by mouth daily., Disp: , Rfl:    [START ON 11/07/2021] methylphenidate (RITALIN) 10 MG tablet, Take 1 tablet (10 mg total) by mouth daily before breakfast., Disp: 30 tablet, Rfl: 0   Multiple Vitamin (MULTI-VITAMIN) tablet, Take by mouth., Disp: , Rfl:    omeprazole (PRILOSEC OTC) 20 MG tablet, Take 20 mg by mouth in the morning  and at bedtime., Disp: , Rfl:    [START ON 11/05/2021] QUEtiapine (SEROQUEL) 25 MG tablet, Take 1 tablet (25 mg total) by mouth at bedtime., Disp: 90 tablet, Rfl: 0   spironolactone (ALDACTONE) 100 MG tablet, TK 1 T PO QD B NOON, Disp: , Rfl: 9   vitamin B-12 (CYANOCOBALAMIN) 1000 MCG tablet, Take 1,000 mcg by mouth daily., Disp: , Rfl:    zaleplon (SONATA) 10 MG capsule, Take 2 capsules (20 mg total) by mouth at bedtime., Disp: 60 capsule, Rfl: 2   colestipol (COLESTID) 1 g tablet, Take by mouth. (Patient not taking: Reported on 10/29/2021), Disp: , Rfl:    [START ON 12/04/2021] methylphenidate (RITALIN) 10 MG tablet, Take 1 tablet (10 mg total) by mouth daily. (Patient not taking: Reported on 10/29/2021), Disp: 30 tablet, Rfl: 0   Na Sulfate-K Sulfate-Mg Sulf 17.5-3.13-1.6 GM/177ML SOLN, Take by mouth. (Patient not  taking: Reported on 10/29/2021), Disp: , Rfl:    Physical exam:  Physical Exam Constitutional:      General: She is not in acute distress. Cardiovascular:     Rate and Rhythm: Normal rate and regular rhythm.     Heart sounds: Normal heart sounds.  Pulmonary:     Effort: Pulmonary effort is normal.     Breath sounds: Normal breath sounds.  Abdominal:     General: Bowel sounds are normal.     Palpations: Abdomen is soft.  Lymphadenopathy:     Comments: No palpable cervical, supraclavicular, axillary or inguinal adenopathy    Skin:    General: Skin is warm and dry.  Neurological:     Mental Status: She is alert and oriented to person, place, and time.           Latest Ref Rng & Units 04/20/2020    8:57 AM  CMP  Glucose 65 - 99 mg/dL 93    BUN 6 - 20 mg/dL 10    Creatinine 0.57 - 1.00 mg/dL 0.77    Sodium 134 - 144 mmol/L 141    Potassium 3.5 - 5.2 mmol/L 4.6    Chloride 96 - 106 mmol/L 103    CO2 20 - 29 mmol/L 25    Calcium 8.7 - 10.2 mg/dL 9.2    Total Protein 6.0 - 8.5 g/dL 6.4    Total Bilirubin 0.0 - 1.2 mg/dL 0.2    Alkaline Phos 44 - 121 IU/L 46    AST 0 - 40  IU/L 15    ALT 0 - 32 IU/L 15        Latest Ref Rng & Units 04/20/2020    8:57 AM  CBC  WBC 3.4 - 10.8 x10E3/uL 10.3    Hemoglobin 11.1 - 15.9 g/dL 12.3    Hematocrit 34.0 - 46.6 % 36.7     Assessment and plan- Patient is a 26 y.o. female referred for neutrophilia  Patient has mild leukocytosis with a white cell count that fluctuates between 10-13 at least for the last 3 years.  Differential has mainly shown neutrophilia which I suspect is overall reactive.  I am inclined to monitor this conservatively without the need for any bone marrow biopsy or further testing at this time.  There is a consistent increase in her white cell count additional testing may be required.  We will repeat a CBC with differential today along with a technologist smear review.  Repeat CBC with differential and 3 months in 6 months and I will see her back in 6 months  Easy bruising: We will check PT PTT INR von Willebrand panel and PFA testing today.  Video visit with me to discuss the results of these blood tests in 3 weeks.   Thank you for this kind referral and the opportunity to participate in the care of this patient   Visit Diagnosis 1. Neutrophilia     Dr. Randa Evens, MD, MPH [facility] at [facility] [phone] 10/29/2021

## 2021-10-29 NOTE — Progress Notes (Signed)
Pt says she bruises easily, wbc count been abnormal for 2 years

## 2021-10-30 LAB — COAG STUDIES INTERP REPORT

## 2021-10-30 LAB — VON WILLEBRAND PANEL
Coagulation Factor VIII: 240 % — ABNORMAL HIGH (ref 56–140)
Ristocetin Co-factor, Plasma: 146 % (ref 50–200)
Von Willebrand Antigen, Plasma: 249 % — ABNORMAL HIGH (ref 50–200)

## 2021-11-10 ENCOUNTER — Ambulatory Visit (INDEPENDENT_AMBULATORY_CARE_PROVIDER_SITE_OTHER): Payer: 59 | Admitting: Licensed Clinical Social Worker

## 2021-11-10 DIAGNOSIS — F331 Major depressive disorder, recurrent, moderate: Secondary | ICD-10-CM | POA: Diagnosis not present

## 2021-11-10 NOTE — Plan of Care (Signed)
  Problem: Decrease depressive symptoms and improve levels of effective functioning Goal: LTG: Reduce frequency, intensity, and duration of depression symptoms as evidenced by: SSB input needed on appropriate metric Outcome: Progressing Goal: STG: Martha [name] WILL PARTICIPATE IN AT LEAST 80% OF SCHEDULED INDIVIDUAL PSYCHOTHERAPY SESSIONS Outcome: Progressing Intervention: Encourage verbalization of feelings/concerns/expectations Note: Allowed/explored Intervention: Encourage self-care activities Note: Reviewed

## 2021-11-10 NOTE — Progress Notes (Signed)
Virtual Visit via Video Note  I connected with Martha [name] on 11/10/21 at  4:00 PM EDT by a video enabled telemedicine application and verified that I am speaking with the correct person using two identifiers.  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 The patient expressed understanding and agreed to proceed.   I discussed the assessment and treatment plan with the patient. The patient was provided an opportunity to ask questions and all were answered. The patient agreed with the plan and demonstrated an understanding of the instructions.   The patient was advised to call back or seek an in-person evaluation if the symptoms worsen or if the condition fails to improve as anticipated.  I provided 37 minutes of non-face-to-face time during this encounter.   Martha Aloisi R Shalandria Elsbernd, LCSW   THERAPIST PROGRESS NOTE  Session Time: 60-437p  Participation Level: None  Behavioral Response: Neat and Well GroomedAlertAnxious  Type of Therapy: Individual Therapy  Treatment Goals addressed: Problem: Decrease depressive symptoms and improve levels of effective functioning Goal: LTG: Reduce frequency, intensity, and duration of depression symptoms as evidenced by: SSB input needed on appropriate metric Outcome: Progressing Goal: STG: Martha [name] WILL PARTICIPATE IN AT LEAST 80% OF SCHEDULED INDIVIDUAL PSYCHOTHERAPY SESSIONS Outcome: Progressing Intervention: Encourage verbalization of feelings/concerns/expectations Note: Allowed/explored Intervention: Encourage self-care activities Note: Reviewed   ProgressTowards Goals: Progressing  Interventions: CBT  Summary: Martha [name] is a 26 y.o. female who presents with improving symptoms related to depression and anxiety. Pt reports that overall mood has been stable and that she is managing stress and  anxiety well. Pt reports good quality and quantity of sleep.   Allowed pt to explore and express thoughts and feelings associated with recent life situations and external stressors. Patient reports that she is continuing to experience stress associated with various health related concerns. Patient reports that she has appointment with rheumatologist coming up and feels that she has some kind of underlying immune issue. Allowed patient safe space to explore her thoughts and feelings associated with this. Patient reports that she has a colonoscopy and endoscopy scheduled July 7th.  Patient reports that she is adjusting well to her new work environment, and that she is getting along with most of her colleagues. Patient explored our thoughts about one particular colleague that triggers frustration.  Patient reports concern about low energy--patient feels that being tired is impacting her functioning outside of work. Patient reports that she is finding that she is drinking more energy drinks and coffee.  Continued recommendations are as follows: self care behaviors, positive social engagements, focusing on overall work/home/life balance, and focusing on positive physical and emotional wellness.   Suicidal/Homicidal: No  Therapist Response: Pt is continuing to apply interventions learned in session into daily life situations. Pt is currently on track to meet goals utilizing interventions mentioned above. Personal growth and progress noted. Treatment to continue as indicated.   Plan: Return again in 4 weeks.  Diagnosis: Moderate episode of recurrent major depressive disorder (Martha [name])  Collaboration of Care: Other pt encouraged to continue psychiatric services with current provider, Dr. Modesta Messing  Patient/Guardian was advised Release of Information must be obtained prior to any record release in order to collaborate their care with an outside provider. Patient/Guardian was advised if they have not already done  so to contact the registration department to sign all necessary forms in order for  [place] to release information regarding their care.   Consent: Patient/Guardian gives verbal consent for treatment and assignment of benefits for services provided during this visit. Patient/Guardian expressed understanding and agreed to proceed.   [place], LCSW 11/10/2021

## 2021-11-16 ENCOUNTER — Telehealth: Payer: Self-pay

## 2021-11-16 ENCOUNTER — Other Ambulatory Visit: Payer: Self-pay | Admitting: Psychiatry

## 2021-11-16 MED ORDER — METHYLPHENIDATE HCL 10 MG PO TABS: 10.0000 mg | ORAL_TABLET | Freq: Every day | ORAL | 0 refills | Status: DC

## 2021-11-16 NOTE — Telephone Encounter (Signed)
Pt called asking to move Rx of Ritalin to [place], [place], [place] ([facility]). She states her usual Pharmacy is out of stock.

## 2021-11-16 NOTE — Telephone Encounter (Signed)
Ordered.   I have utilized the [place] Controlled Substances Reporting System (PMP AWARxE) to confirm adherence regarding the patient's medication. My review reveals appropriate prescription fills.

## 2021-11-17 ENCOUNTER — Other Ambulatory Visit: Payer: Self-pay | Admitting: Psychiatry

## 2021-11-17 MED ORDER — METHYLPHENIDATE HCL 10 MG PO TABS: 10.0000 mg | ORAL_TABLET | Freq: Every day | ORAL | 0 refills | Status: DC

## 2021-11-19 ENCOUNTER — Encounter: Payer: Self-pay | Admitting: Oncology

## 2021-11-19 ENCOUNTER — Inpatient Hospital Stay (HOSPITAL_BASED_OUTPATIENT_CLINIC_OR_DEPARTMENT_OTHER): Payer: Managed Care, Other (non HMO) | Admitting: Oncology

## 2021-11-19 DIAGNOSIS — Z87891 Personal history of nicotine dependence: Secondary | ICD-10-CM

## 2021-11-19 DIAGNOSIS — D729 Disorder of white blood cells, unspecified: Secondary | ICD-10-CM

## 2021-11-19 DIAGNOSIS — Z806 Family history of leukemia: Secondary | ICD-10-CM

## 2021-11-19 DIAGNOSIS — R233 Spontaneous ecchymoses: Secondary | ICD-10-CM | POA: Diagnosis not present

## 2021-11-19 DIAGNOSIS — Z808 Family history of malignant neoplasm of other organs or systems: Secondary | ICD-10-CM

## 2021-11-19 DIAGNOSIS — Z803 Family history of malignant neoplasm of breast: Secondary | ICD-10-CM

## 2021-11-19 DIAGNOSIS — D72828 Other elevated white blood cell count: Secondary | ICD-10-CM

## 2021-11-19 DIAGNOSIS — E119 Type 2 diabetes mellitus without complications: Secondary | ICD-10-CM | POA: Diagnosis not present

## 2021-11-19 NOTE — Progress Notes (Signed)
RN called and verified patient name and date of birth.  Reviewed pt medications.  Pt verbalized understanding of mychart video visit today @ 315 pm.

## 2021-12-02 ENCOUNTER — Encounter: Payer: Self-pay | Admitting: *Deleted

## 2021-12-02 NOTE — Progress Notes (Signed)
BH MD/PA/NP OP Progress Note  12/07/2021 11:40 AM Martha [name]  MRN:  [id number]  Chief Complaint:  Chief Complaint  Patient presents with   Follow-up   Depression   HPI:  This is a follow-up appointment for depression.  She states that she has been frazzled due to work.  It has been busy today.  She underwent a colonoscopy, and will have a result in a few weeks.  She has an upcoming procedure to get crown.  She feels fatigue, and stays in the bed most of the time on weekends.  She does not enjoy playing video games anymore due to fatigue.  Although there was a day she felt passive SI when she was undergoing colonoscopy and not being able to take her medication, she denies any SI since then.  She has depressive symptoms as in PHQ-9. Reviewed sleep study done in 2021. The diagnosis was OSA. She agrees to contact her provider for this treatment.  She feels anxious and tense.  She is able to focus well at work.  She feels comfortable to stay on her medication.    Wt Readings from Last 3 Encounters:  12/03/21 (!) 355 lb (161 kg)  10/07/21 (!) 362 lb (164.2 kg)  04/19/21 (!) 352 lb 9.6 oz (159.9 kg)     Visit Diagnosis:    ICD-10-CM   1. Moderate episode of recurrent major depressive disorder (HCC)  F33.1     2. Insomnia, unspecified type  G47.00     3. Attention deficit hyperactivity disorder (ADHD), unspecified ADHD type  F90.9       Past Psychiatric History: Please see initial evaluation for full details. I have reviewed the history. No updates at this time.     Past Medical History:  Past Medical History:  Diagnosis Date   ADHD (attention deficit hyperactivity disorder)    no meds taken   Allergy    Anxiety    Depression    Diabetes mellitus    pre-diabetic,told when younger   Family history of breast cancer    12/20 cancer genetic testing letter sent   Headache(784.0)    chronic migraines, decrease since BCP started   History of hiatal hernia    Idiopathic  intracranial hypertension    Insomnia    Irregular menstrual cycle    pt states she started BCP,unsure of name to help regulate cycle   Obesity    Vision abnormalities    near sighted,wears glasses    Past Surgical History:  Procedure Laterality Date   CHOLECYSTECTOMY     COLONOSCOPY WITH PROPOFOL N/A 12/03/2021   Procedure: COLONOSCOPY WITH PROPOFOL;  Surgeon: Lesly Rubenstein, MD;  Location: [facility] ENDOSCOPY;  Service: Endoscopy;  Laterality: N/A;   ESOPHAGOGASTRODUODENOSCOPY (EGD) WITH PROPOFOL N/A 12/03/2021   Procedure: ESOPHAGOGASTRODUODENOSCOPY (EGD) WITH PROPOFOL;  Surgeon: Lesly Rubenstein, MD;  Location: [facility] ENDOSCOPY;  Service: Endoscopy;  Laterality: N/A;  DM   TONSILLECTOMY     at [facility]  01/2012    Family Psychiatric History: Please see initial evaluation for full details. I have reviewed the history. No updates at this time.     Family History:  Family History  Problem Relation Age of Onset   Thyroid cancer Mother 72   Depression Mother    Hypertension Father    Anxiety disorder Father    Diabetes type II Father    Skin cancer Maternal Grandfather    Alzheimer's disease Maternal Grandfather  Alzheimer's disease Paternal Grandmother    Leukemia Paternal Aunt    Breast cancer Paternal Aunt 40   Hypertension Maternal Grandmother    Diabetes type II Maternal Grandmother     Social History:  Social History   Socioeconomic History   Marital status: Single    Spouse name: Not on file   Number of children: Not on file   Years of education: Not on file   Highest education level: Not on file  Occupational History   Occupation: Consulting civil engineer    Comment: [employer] 12th grade  Tobacco Use   Smoking status: Never   Smokeless tobacco: Never  Vaping Use   Vaping Use: Former   Quit date: 10/30/2018  Substance and Sexual Activity   Alcohol use: Yes    Comment: occ   Drug use: No   Sexual activity: Not Currently    Partners: Male     Birth control/protection: Pill  Other Topics Concern   Not on file  Social History Narrative   Not on file   Social Determinants of Health   Financial Resource Strain: Not on file  Food Insecurity: Not on file  Transportation Needs: Not on file  Physical Activity: Inactive (04/07/2017)   Exercise Vital Sign    Days of Exercise per Week: 0 days    Minutes of Exercise per Session: 0 min  Stress: Stress Concern Present (04/07/2017)   [employer] of Occupational Health - Occupational Stress Questionnaire    Feeling of Stress : To some extent  Social Connections: Somewhat Isolated (04/07/2017)   Social Connection and Isolation Panel [NHANES]    Frequency of Communication with Friends and Family: Once a week    Frequency of Social Gatherings with Friends and Family: More than three times a week    Attends Religious Services: Never    Database administrator or Organizations: Yes    Attends Engineer, structural: More than 4 times per year    Marital Status: Never married    Allergies:  Allergies  Allergen Reactions   Lamisil [Terbinafine]     Metabolic Disorder Labs: Lab Results  Component Value Date   HGBA1C 6.5 (H) 04/19/2021   No results found for: "PROLACTIN" Lab Results  Component Value Date   CHOL 151 04/20/2020   TRIG 136 04/20/2020   HDL 40 04/20/2020   CHOLHDL 3.8 04/20/2020   LDLCALC 87 04/20/2020   Lab Results  Component Value Date   TSH 6.670 (H) 04/20/2020   TSH 2.405 02/23/2015    Therapeutic Level Labs: No results found for: "LITHIUM" No results found for: "VALPROATE" No results found for: "CBMZ"  Current Medications: Current Outpatient Medications  Medication Sig Dispense Refill   [START ON 12/17/2021] methylphenidate (RITALIN) 10 MG tablet Take 1 tablet (10 mg total) by mouth daily before breakfast. 30 tablet 0   [START ON 01/17/2022] methylphenidate (RITALIN) 10 MG tablet Take 1 tablet (10 mg total) by mouth daily before breakfast. 30  tablet 0   ascorbic acid (VITAMIN C) 1000 MG tablet Take 1,000 mg by mouth daily.     busPIRone (BUSPAR) 30 MG tablet Take 1 tablet (30 mg total) by mouth at bedtime. 90 tablet 0   calcium carbonate (OSCAL) 1500 (600 Ca) MG TABS tablet Take 1,500 mg by mouth 2 (two) times daily with a meal.     cetirizine (ZYRTEC) 10 MG tablet Take 10 mg by mouth daily.     Cholecalciferol (VITAMIN D-1000 MAX ST) 25 MCG (1000  UT) tablet Take 1,000 Units by mouth daily.     colestipol (COLESTID) 1 g tablet Take by mouth. (Patient not taking: Reported on 10/29/2021)     desvenlafaxine (PRISTIQ) 100 MG 24 hr tablet Take 1 tablet (100 mg total) by mouth daily. 90 tablet 1   hydrochlorothiazide (MICROZIDE) 12.5 MG capsule Take 12.5 mg by mouth daily.     levonorgestrel-ethinyl estradiol (INTROVALE) 0.15-0.03 MG tablet Take 1 tablet by mouth daily. 91 tablet 4   levothyroxine (SYNTHROID) 50 MCG tablet Take 50 mcg by mouth daily before breakfast.     LORazepam (ATIVAN) 1 MG tablet Take by mouth.     magnesium oxide (MAG-OX) 400 MG tablet Take by mouth.     meloxicam (MOBIC) 7.5 MG tablet Take up to 1-2 tablets daily by mouth as needed for pain.     metFORMIN (GLUCOPHAGE) 500 MG tablet Take 250 mg by mouth daily.     methylphenidate (RITALIN) 10 MG tablet Take 1 tablet (10 mg total) by mouth daily before breakfast. (Patient not taking: Reported on 11/19/2021) 30 tablet 0   methylphenidate (RITALIN) 10 MG tablet Take 1 tablet (10 mg total) by mouth daily. 30 tablet 0   Multiple Vitamin (MULTI-VITAMIN) tablet Take by mouth.     Na Sulfate-K Sulfate-Mg Sulf 17.5-3.13-1.6 GM/177ML SOLN Take by mouth. (Patient not taking: Reported on 10/29/2021)     omeprazole (PRILOSEC OTC) 20 MG tablet Take 20 mg by mouth in the morning and at bedtime.     predniSONE (DELTASONE) 10 MG tablet Take 10 mg by mouth daily with breakfast. (Patient not taking: Reported on 12/03/2021)     promethazine (PHENERGAN) 12.5 MG tablet Take 12.5 mg by mouth  every 6 (six) hours as needed for nausea or vomiting.     QUEtiapine (SEROQUEL) 25 MG tablet Take 1 tablet (25 mg total) by mouth at bedtime. 90 tablet 0   spironolactone (ALDACTONE) 100 MG tablet TK 1 T PO QD B NOON  9   vitamin B-12 (CYANOCOBALAMIN) 1000 MCG tablet Take 1,000 mcg by mouth daily.     [START ON 12/28/2021] zaleplon (SONATA) 10 MG capsule Take 2 capsules (20 mg total) by mouth at bedtime. 60 capsule 2   No current facility-administered medications for this visit.     Musculoskeletal: Strength & Muscle Tone: within normal limits Gait & Station: normal Patient leans: N/A  Psychiatric Specialty Exam: Review of Systems  Psychiatric/Behavioral:  Positive for decreased concentration, dysphoric mood and sleep disturbance. Negative for agitation, behavioral problems, confusion, hallucinations, self-injury and suicidal ideas. The patient is nervous/anxious. The patient is not hyperactive.   All other systems reviewed and are negative.   There were no vitals taken for this visit.There is no height or weight on file to calculate BMI.  General Appearance: Fairly Groomed  Eye Contact:  Good  Speech:  Clear and Coherent  Volume:  Normal  Mood:   tired  Affect:  Appropriate, Congruent, and calm  Thought Process:  Coherent  Orientation:  Full (Time, Place, and Person)  Thought Content: Logical   Suicidal Thoughts:  No  Homicidal Thoughts:  No  Memory:  Immediate;   Good  Judgement:  Good  Insight:  Good  Psychomotor Activity:  Normal  Concentration:  Concentration: Good and Attention Span: Good  Recall:  Good  Fund of Knowledge: Good  Language: Good  Akathisia:  No  Handed:  Right  AIMS (if indicated): not done  Assets:  Communication Skills Desire for Improvement  ADL's:  Intact  Cognition: WNL  Sleep:   hypersomnia   Screenings: GAD-7    Flowsheet Row Office Visit from 04/19/2021 in [facility] Office Visit from 04/16/2020 in [facility]  Office Visit from 04/15/2019 in [facility]  Total GAD-7 Score 7 7 10       PHQ2-9    [place] Visit from 12/07/2021 in [place] Office Visit from 10/07/2021 in [place] Counselor from 06/21/2021 in [place] Office Visit from 04/19/2021 in [facility] Video Visit from 02/03/2021 in [place]  PHQ-2 Total Score 3 2 1 2 2   PHQ-9 Total Score 11 12 -- 11 12      [place] Office Visit from 12/07/2021 in [place] Admission (Discharged) from 12/03/2021 in [place] Counselor from 11/10/2021 in [place] Error: Q3, 4, or 5 should not be populated when Q2 is No No Risk No Risk        Assessment and Plan:  Martha [name] is a 26 y.o. year old female with a history of depression, anxiety, ADHD by history, migraine, GERD, who presents for follow up appointment for below.    1. Moderate episode of recurrent major depressive disorder ([place]) Although she continues to report occasional depressive symptoms in the context of stress at work and medical evaluation/upcoming dental procedure , she has been handling things relatively well. Other psychosocial stressors includes loss of her friends, family including her aunt, financial strain, conflict with her father, medical condition of her grandmother with Alzheimer disease.  Will continue Pristiq and quetiapine to target depression.  Will continue BuSpar for anxiety.   2. Insomnia, unspecified type Unchanged.  According to the chart review, she was diagnosed with obstructive sleep apnea, and PAP titration was recommended in 2021.  She was advised to contact her provider for further evaluation/treatment.  She reports good benefit from zaleplon; will continue current dose as needed for  insomnia.   3. Attention deficit hyperactivity disorder (ADHD), unspecified ADHD type Neuropsych testing was consistent with ADHD.  Will continue current dose of Ritalin to target ADHD.    Plan  Continue Pristiq 100 mg daily  Continue quetiapine 25 mg at night - monitor weight gain Continue buspirone 30 mg at night Continue Zaleplon 20 mg at night as needed for sleep Continue Ritalin 10 mg daily  Next appointment: 9/5 at 4 PM for 30 mins, in person   Past trials of medication: sertraline, Pristiq, bupropion (some adverse reaction in the past), Abilify (visual change)     The patient demonstrates the following risk factors for suicide: Chronic risk factors for suicide include: psychiatric disorder of depression, anxiety. Acute risk factors for suicide include: family or marital conflict. Protective factors for this patient include: positive social support, coping skills and hope for the future. Considering these factors, the overall suicide risk at this point appears to be low. Patient is appropriate for outpatient follow up.   Collaboration of Care: Collaboration of Care: Other N/A     Consent: Patient/Guardian gives verbal consent for treatment and assignment of benefits for services provided during this visit. Patient/Guardian expressed understanding and agreed to proceed.   Collaboration of Care: Collaboration of Care: Other review notes  Patient/Guardian was advised Release of Information must be obtained prior to any record release in order to collaborate their care with an outside provider. Patient/Guardian was  advised if they have not already done so to contact the registration department to sign all necessary forms in order for [place] to release information regarding their care.   Consent: Patient/Guardian gives verbal consent for treatment and assignment of benefits for services provided during this visit. Patient/Guardian expressed understanding and agreed to proceed.    This  clinician has discussed the side effect associated with medication prescribed during this encounter. Please refer to notes in the previous encounters for more details.    Neysa Hotter, MD 12/07/2021, 11:40 AM

## 2021-12-03 ENCOUNTER — Encounter
Admission: RE | Disposition: A | Discharge status: Home / Self Care | Payer: Self-pay | Source: Home / Self Care | Attending: Gastroenterology

## 2021-12-03 ENCOUNTER — Ambulatory Visit
Admission: RE | Admit: 2021-12-03 | Discharge: 2021-12-03 | Disposition: A | Discharge status: Home / Self Care | Payer: Managed Care, Other (non HMO) | Attending: Gastroenterology | Admitting: Gastroenterology

## 2021-12-03 ENCOUNTER — Encounter: Payer: Self-pay | Admitting: *Deleted

## 2021-12-03 ENCOUNTER — Ambulatory Visit: Payer: Managed Care, Other (non HMO) | Admitting: Certified Registered"

## 2021-12-03 DIAGNOSIS — Z7984 Long term (current) use of oral hypoglycemic drugs: Secondary | ICD-10-CM | POA: Insufficient documentation

## 2021-12-03 DIAGNOSIS — K2970 Gastritis, unspecified, without bleeding: Secondary | ICD-10-CM | POA: Diagnosis not present

## 2021-12-03 DIAGNOSIS — K2100 Gastro-esophageal reflux disease with esophagitis, without bleeding: Secondary | ICD-10-CM | POA: Diagnosis not present

## 2021-12-03 DIAGNOSIS — E669 Obesity, unspecified: Secondary | ICD-10-CM | POA: Insufficient documentation

## 2021-12-03 DIAGNOSIS — I10 Essential (primary) hypertension: Secondary | ICD-10-CM | POA: Insufficient documentation

## 2021-12-03 DIAGNOSIS — Z9049 Acquired absence of other specified parts of digestive tract: Secondary | ICD-10-CM | POA: Diagnosis not present

## 2021-12-03 DIAGNOSIS — K529 Noninfective gastroenteritis and colitis, unspecified: Secondary | ICD-10-CM | POA: Diagnosis present

## 2021-12-03 DIAGNOSIS — Z6841 Body Mass Index (BMI) 40.0 and over, adult: Secondary | ICD-10-CM | POA: Diagnosis not present

## 2021-12-03 DIAGNOSIS — E119 Type 2 diabetes mellitus without complications: Secondary | ICD-10-CM | POA: Insufficient documentation

## 2021-12-03 HISTORY — DX: Depression, unspecified: F32.A

## 2021-12-03 HISTORY — PX: COLONOSCOPY WITH PROPOFOL: SHX5780

## 2021-12-03 HISTORY — PX: ESOPHAGOGASTRODUODENOSCOPY (EGD) WITH PROPOFOL: SHX5813

## 2021-12-03 HISTORY — DX: Personal history of other diseases of the digestive system: Z87.19

## 2021-12-03 LAB — POCT PREGNANCY, URINE: Preg Test, Ur: NEGATIVE

## 2021-12-03 LAB — GLUCOSE, CAPILLARY: Glucose-Capillary: 108 mg/dL — ABNORMAL HIGH (ref 70–99)

## 2021-12-03 SURGERY — ESOPHAGOGASTRODUODENOSCOPY (EGD) WITH PROPOFOL
Anesthesia: General

## 2021-12-03 MED ORDER — LIDOCAINE HCL (CARDIAC) PF 100 MG/5ML IV SOSY
PREFILLED_SYRINGE | INTRAVENOUS | Status: DC | PRN
  Administered 2021-12-03: 100 mg via INTRAVENOUS

## 2021-12-03 MED ORDER — PROPOFOL 10 MG/ML IV BOLUS
INTRAVENOUS | Status: DC | PRN
  Administered 2021-12-03: 20 mg via INTRAVENOUS
  Administered 2021-12-03: 30 mg via INTRAVENOUS
  Administered 2021-12-03: 120 mg via INTRAVENOUS
  Administered 2021-12-03: 2 mg via INTRAVENOUS
  Administered 2021-12-03 (×2): 20 mg via INTRAVENOUS
  Administered 2021-12-03: 30 mg via INTRAVENOUS
  Administered 2021-12-03: 50 mg via INTRAVENOUS

## 2021-12-03 MED ORDER — DEXMEDETOMIDINE (PRECEDEX) IN NS 20 MCG/5ML (4 MCG/ML) IV SYRINGE
PREFILLED_SYRINGE | INTRAVENOUS | Status: DC | PRN
  Administered 2021-12-03: 12 ug via INTRAVENOUS

## 2021-12-03 MED ORDER — SODIUM CHLORIDE 0.9 % IV SOLN: INTRAVENOUS | Status: DC

## 2021-12-03 NOTE — Anesthesia Postprocedure Evaluation (Signed)
Anesthesia Post Note  Patient: Martha [name]  Procedure(s) Performed: ESOPHAGOGASTRODUODENOSCOPY (EGD) WITH PROPOFOL COLONOSCOPY WITH PROPOFOL  Patient location during evaluation: PACU Anesthesia Type: General Level of consciousness: awake and awake and alert Pain management: pain level controlled Vital Signs Assessment: post-procedure vital signs reviewed and stable Respiratory status: nonlabored ventilation and respiratory function stable Anesthetic complications: no   No notable events documented.   Last Vitals:  Vitals:   12/03/21 1239 12/03/21 1334  BP: (!) 144/95 131/75  Pulse: (!) 101 86  Resp: 16 12  Temp: (!) 36.4 C (!) 35.9 C  SpO2: 97% 99%    Last Pain:  Vitals:   12/03/21 1334  TempSrc: Temporal  PainSc: Asleep                 VAN STAVEREN,Praveen Coia

## 2021-12-03 NOTE — Op Note (Signed)
[facility]enter[facility] Patient Name: Martha [name] Procedure Date: 12/03/2021 12:57 PM MRN: [id number] Account #: [id number] Date of Birth: 1995/10/16 Admit Type: Outpatient Age: 26 Room: [facility] ENDO ROOM 3 Gender: Female Note Status: Finalized Instrument Name: Jasper Riling [id number] Procedure:             Colonoscopy Indications:           Chronic diarrhea Providers:             Andrey Farmer MD, MD Referring MD:          Donnamarie Rossetti (Referring MD) Medicines:             Monitored Anesthesia Care Complications:         No immediate complications. Estimated blood loss:                         Minimal. Procedure:             Pre-Anesthesia Assessment:                        - Prior to the procedure, a History and Physical was                         performed, and patient medications and allergies were                         reviewed. The patient is competent. The risks and                         benefits of the procedure and the sedation options and                         risks were discussed with the patient. All questions                         were answered and informed consent was obtained.                         Patient identification and proposed procedure were                         verified by the physician, the nurse, the                         anesthesiologist, the anesthetist and the technician                         in the endoscopy suite. Mental Status Examination:                         alert and oriented. Airway Examination: normal                         oropharyngeal airway and neck mobility. Respiratory                         Examination: clear to auscultation. CV Examination:  normal. Prophylactic Antibiotics: The patient does not                         require prophylactic antibiotics. Prior                         Anticoagulants: The patient has taken no previous                          anticoagulant or antiplatelet agents. ASA Grade                         Assessment: III - A patient with severe systemic                         disease. After reviewing the risks and benefits, the                         patient was deemed in satisfactory condition to                         undergo the procedure. The anesthesia plan was to use                         monitored anesthesia care (MAC). Immediately prior to                         administration of medications, the patient was                         re-assessed for adequacy to receive sedatives. The                         heart rate, respiratory rate, oxygen saturations,                         blood pressure, adequacy of pulmonary ventilation, and                         response to care were monitored throughout the                         procedure. The physical status of the patient was                         re-assessed after the procedure.                        After obtaining informed consent, the colonoscope was                         passed under direct vision. Throughout the procedure,                         the patient's blood pressure, pulse, and oxygen                         saturations were monitored continuously. The  Colonoscope was introduced through the anus and                         advanced to the the terminal ileum. The colonoscopy                         was somewhat difficult due to poor bowel prep. The                         patient tolerated the procedure well. The quality of                         the bowel preparation was poor. Findings:      The perianal and digital rectal examinations were normal.      The terminal ileum appeared normal.      Normal mucosa was found in the entire colon. Biopsies for histology were       taken with a cold forceps from the entire colon for evaluation of       microscopic colitis. Estimated blood loss was minimal.      The exam was  otherwise without abnormality on direct and retroflexion       views. Impression:            - Preparation of the colon was poor.                        - The examined portion of the ileum was normal.                        - Normal mucosa in the entire examined colon. Biopsied.                        - The examination was otherwise normal on direct and                         retroflexion views. Recommendation:        - Discharge patient to home.                        - Resume previous diet.                        - Continue present medications.                        - Await pathology results.                        - Repeat colonoscopy at age 13 for screening purposes.                        - Return to referring physician as previously                         scheduled. Procedure Code(s):     --- Professional ---                        [phone], Colonoscopy, flexible; with biopsy, single or  multiple Diagnosis Code(s):     --- Professional ---                        K52.9, Noninfective gastroenteritis and colitis,                         unspecified CPT copyright 2019 American Medical Association. All rights reserved. The codes documented in this report are preliminary and upon coder review may  be revised to meet current compliance requirements. Andrey Farmer MD, MD 12/03/2021 1:37:45 PM Number of Addenda: 0 Note Initiated On: 12/03/2021 12:57 PM Scope Withdrawal Time: 0 hours 3 minutes 30 seconds  Total Procedure Duration: 0 hours 7 minutes 37 seconds  Estimated Blood Loss:  Estimated blood loss was minimal.      [facility]

## 2021-12-03 NOTE — Transfer of Care (Signed)
Immediate Anesthesia Transfer of Care Note  Patient: Martha [name]  Procedure(s) Performed: ESOPHAGOGASTRODUODENOSCOPY (EGD) WITH PROPOFOL COLONOSCOPY WITH PROPOFOL  Patient Location: PACU and Endoscopy Unit  Anesthesia Type:General  Level of Consciousness: drowsy  Airway & Oxygen Therapy: Patient connected to nasal cannula oxygen  Post-op Assessment: Report given to RN  Post vital signs: stable, reviewed  Last Vitals:  Vitals Value Taken Time  BP 131/75 12/03/21 1334  Temp 35.9 C 12/03/21 1334  Pulse 86 12/03/21 1334  Resp 12 12/03/21 1334  SpO2 99 % 12/03/21 1334    Last Pain:  Vitals:   12/03/21 1334  TempSrc: Temporal  PainSc: Asleep         Complications: No notable events documented.

## 2021-12-03 NOTE — Op Note (Signed)
[facility] Patient Name: Martha [name] Procedure Date: 12/03/2021 1:02 PM MRN: [id number] Account #: [id number] Date of Birth: Oct 19, 1995 Admit Type: Outpatient Age: 26 Room: [facility] ENDO ROOM 3 Gender: Female Note Status: Finalized Instrument Name: Upper Endoscope [id number] Procedure:             Upper GI endoscopy Indications:           Gastro-esophageal reflux disease, Nausea Providers:             Andrey Farmer MD, MD Referring MD:          Donnamarie Rossetti (Referring MD) Medicines:             Monitored Anesthesia Care Complications:         No immediate complications. Estimated blood loss:                         Minimal. Procedure:             Pre-Anesthesia Assessment:                        - Prior to the procedure, a History and Physical was                         performed, and patient medications and allergies were                         reviewed. The patient is competent. The risks and                         benefits of the procedure and the sedation options and                         risks were discussed with the patient. All questions                         were answered and informed consent was obtained.                         Patient identification and proposed procedure were                         verified by the physician, the nurse, the                         anesthesiologist, the anesthetist and the technician                         in the endoscopy suite. Mental Status Examination:                         alert and oriented. Airway Examination: normal                         oropharyngeal airway and neck mobility. Respiratory                         Examination: clear to auscultation. CV Examination:  normal. Prophylactic Antibiotics: The patient does not                         require prophylactic antibiotics. Prior                         Anticoagulants: The patient has taken no previous                          anticoagulant or antiplatelet agents. ASA Grade                         Assessment: III - A patient with severe systemic                         disease. After reviewing the risks and benefits, the                         patient was deemed in satisfactory condition to                         undergo the procedure. The anesthesia plan was to use                         monitored anesthesia care (MAC). Immediately prior to                         administration of medications, the patient was                         re-assessed for adequacy to receive sedatives. The                         heart rate, respiratory rate, oxygen saturations,                         blood pressure, adequacy of pulmonary ventilation, and                         response to care were monitored throughout the                         procedure. The physical status of the patient was                         re-assessed after the procedure.                        After obtaining informed consent, the endoscope was                         passed under direct vision. Throughout the procedure,                         the patient's blood pressure, pulse, and oxygen                         saturations were monitored continuously. The Endoscope  was introduced through the mouth, and advanced to the                         second part of duodenum. The upper GI endoscopy was                         accomplished without difficulty. The patient tolerated                         the procedure well. Findings:      The examined esophagus was normal.      Patchy mild inflammation characterized by erythema was found in the       gastric antrum. Biopsies were taken with a cold forceps for Helicobacter       pylori testing. Estimated blood loss was minimal.      The examined duodenum was normal. Impression:            - Normal esophagus.                        - Gastritis. Biopsied.                         - Normal examined duodenum. Recommendation:        - Await pathology results.                        - Perform a colonoscopy today. Procedure Code(s):     --- Professional ---                        [phone], Esophagogastroduodenoscopy, flexible,                         transoral; with biopsy, single or multiple Diagnosis Code(s):     --- Professional ---                        K29.70, Gastritis, unspecified, without bleeding                        K21.9, Gastro-esophageal reflux disease without                         esophagitis                        R11.0, Nausea CPT copyright 2019 [employer]. All rights reserved. The codes documented in this report are preliminary and upon coder review may  be revised to meet current compliance requirements. Andrey Farmer MD, MD 12/03/2021 1:35:18 PM Number of Addenda: 0 Note Initiated On: 12/03/2021 1:02 PM Estimated Blood Loss:  Estimated blood loss was minimal.      [facility]

## 2021-12-03 NOTE — H&P (Signed)
Outpatient short stay form Pre-procedure 12/03/2021  Regis Bill, MD  Primary Physician: Wilford Corner, PA-C  Reason for visit:  Chronic nausea/Chronic diarrhea  History of present illness:    26 y/o lady with history of obesity, hypertension, and anxiety/depression here for EGD for chronic nausea and chronic loose stools. No blood thinners. No family history of GI malignancies. Father with polyps but no family history of GI malignancies.    Current Facility-Administered Medications:    0.9 %  sodium chloride infusion, , Intravenous, Continuous, Danelle Curiale, Rossie Muskrat, MD, Last Rate: 20 mL/hr at 12/03/21 1257, New Bag at 12/03/21 1257  Medications Prior to Admission  Medication Sig Dispense Refill Last Dose   ascorbic acid (VITAMIN C) 1000 MG tablet Take 1,000 mg by mouth daily.   12/02/2021   calcium carbonate (OSCAL) 1500 (600 Ca) MG TABS tablet Take 1,500 mg by mouth 2 (two) times daily with a meal.   12/02/2021   Cholecalciferol (VITAMIN D-1000 MAX ST) 25 MCG (1000 UT) tablet Take 1,000 Units by mouth daily.   12/02/2021   desvenlafaxine (PRISTIQ) 100 MG 24 hr tablet Take 1 tablet (100 mg total) by mouth daily. 90 tablet 1 12/02/2021   hydrochlorothiazide (MICROZIDE) 12.5 MG capsule Take 12.5 mg by mouth daily.   12/02/2021   levonorgestrel-ethinyl estradiol (INTROVALE) 0.15-0.03 MG tablet Take 1 tablet by mouth daily. 91 tablet 4 12/02/2021   levothyroxine (SYNTHROID) 50 MCG tablet Take 50 mcg by mouth daily before breakfast.   12/03/2021   LORazepam (ATIVAN) 1 MG tablet Take by mouth.   Past Month   magnesium oxide (MAG-OX) 400 MG tablet Take by mouth.   12/02/2021   meloxicam (MOBIC) 7.5 MG tablet Take up to 1-2 tablets daily by mouth as needed for pain.   Past Week   metFORMIN (GLUCOPHAGE) 500 MG tablet Take 250 mg by mouth daily.   12/02/2021   methylphenidate (RITALIN) 10 MG tablet Take 1 tablet (10 mg total) by mouth daily. 30 tablet 0 12/02/2021   Multiple Vitamin (MULTI-VITAMIN)  tablet Take by mouth.   Past Week   omeprazole (PRILOSEC OTC) 20 MG tablet Take 20 mg by mouth in the morning and at bedtime.   Past Week   promethazine (PHENERGAN) 12.5 MG tablet Take 12.5 mg by mouth every 6 (six) hours as needed for nausea or vomiting.      QUEtiapine (SEROQUEL) 25 MG tablet Take 1 tablet (25 mg total) by mouth at bedtime. 90 tablet 0 12/02/2021   spironolactone (ALDACTONE) 100 MG tablet TK 1 T PO QD B NOON  9 12/02/2021   vitamin B-12 (CYANOCOBALAMIN) 1000 MCG tablet Take 1,000 mcg by mouth daily.   12/02/2021   zaleplon (SONATA) 10 MG capsule Take 2 capsules (20 mg total) by mouth at bedtime. 60 capsule 2 12/02/2021   busPIRone (BUSPAR) 30 MG tablet Take 1 tablet (30 mg total) by mouth at bedtime. 90 tablet 0    cetirizine (ZYRTEC) 10 MG tablet Take 10 mg by mouth daily.      colestipol (COLESTID) 1 g tablet Take by mouth. (Patient not taking: Reported on 10/29/2021)      methylphenidate (RITALIN) 10 MG tablet Take 1 tablet (10 mg total) by mouth daily before breakfast. (Patient not taking: Reported on 11/19/2021) 30 tablet 0    Na Sulfate-K Sulfate-Mg Sulf 17.5-3.13-1.6 GM/177ML SOLN Take by mouth. (Patient not taking: Reported on 10/29/2021)      predniSONE (DELTASONE) 10 MG tablet Take 10 mg by mouth  daily with breakfast. (Patient not taking: Reported on 12/03/2021)   Not Taking     Allergies  Allergen Reactions   Lamisil [Terbinafine]      Past Medical History:  Diagnosis Date   ADHD (attention deficit hyperactivity disorder)    no meds taken   Allergy    Anxiety    Depression    Diabetes mellitus    pre-diabetic,told when younger   Family history of breast cancer    12/20 cancer genetic testing letter sent   Headache(784.0)    chronic migraines, decrease since BCP started   History of hiatal hernia    Idiopathic intracranial hypertension    Insomnia    Irregular menstrual cycle    pt states she started BCP,unsure of name to help regulate cycle   Obesity    Vision  abnormalities    near sighted,wears glasses    Review of systems:  Otherwise negative.    Physical Exam  Gen: Alert, oriented. Appears stated age.  HEENT: PERRLA. Lungs: No respiratory distress CV: RRR Abd: soft, benign, no masses Ext: No edema    Planned procedures: Proceed with EGD/colonoscopy. The patient understands the nature of the planned procedure, indications, risks, alternatives and potential complications including but not limited to bleeding, infection, perforation, damage to internal organs and possible oversedation/side effects from anesthesia. The patient agrees and gives consent to proceed.  Please refer to procedure notes for findings, recommendations and patient disposition/instructions.     Regis Bill, MD [facility]

## 2021-12-03 NOTE — Anesthesia Preprocedure Evaluation (Signed)
Anesthesia Evaluation  Patient identified by MRN, date of birth, ID band Patient awake    Reviewed: Allergy & Precautions, NPO status , Patient's Chart, lab work & pertinent test results  Airway Mallampati: III  TM Distance: >3 FB Neck ROM: Full    Dental  (+) Teeth Intact   Pulmonary neg pulmonary ROS,    Pulmonary exam normal breath sounds clear to auscultation       Cardiovascular hypertension, Pt. on medications negative cardio ROS Normal cardiovascular exam Rhythm:Regular     Neuro/Psych  Headaches, Anxiety Depression negative neurological ROS  negative psych ROS   GI/Hepatic negative GI ROS, Neg liver ROS, hiatal hernia, GERD  Medicated,  Endo/Other  negative endocrine ROSdiabetes, Type 2, Oral Hypoglycemic AgentsMorbid obesity  Renal/GU negative Renal ROS  negative genitourinary   Musculoskeletal negative musculoskeletal ROS (+)   Abdominal (+) + obese,   Peds negative pediatric ROS (+)  Hematology negative hematology ROS (+)   Anesthesia Other Findings Past Medical History: No date: ADHD (attention deficit hyperactivity disorder)     Comment:  no meds taken No date: Allergy No date: Anxiety No date: Depression No date: Diabetes mellitus     Comment:  pre-diabetic,told when younger No date: Family history of breast cancer     Comment:  12/20 cancer genetic testing letter sent No date: Headache(784.0)     Comment:  chronic migraines, decrease since BCP started No date: History of hiatal hernia No date: Idiopathic intracranial hypertension No date: Insomnia No date: Irregular menstrual cycle     Comment:  pt states she started BCP,unsure of name to help               regulate cycle No date: Obesity No date: Vision abnormalities     Comment:  near sighted,wears glasses  Past Surgical History: No date: CHOLECYSTECTOMY No date: TONSILLECTOMY     Comment:  at 26yo 01/2012: UPPER GI ENDOSCOPY  BMI     Body Mass Index: 53.98 kg/m      Reproductive/Obstetrics negative OB ROS                             Anesthesia Physical Anesthesia Plan  ASA: 3  Anesthesia Plan: General   Post-op Pain Management:    Induction: Intravenous  PONV Risk Score and Plan: Propofol infusion and TIVA  Airway Management Planned: Natural Airway  Additional Equipment:   Intra-op Plan:   Post-operative Plan:   Informed Consent: I have reviewed the patients History and Physical, chart, labs and discussed the procedure including the risks, benefits and alternatives for the proposed anesthesia with the patient or authorized representative who has indicated his/her understanding and acceptance.     Dental Advisory Given  Plan Discussed with: CRNA and Surgeon  Anesthesia Plan Comments:         Anesthesia Quick Evaluation

## 2021-12-03 NOTE — Interval H&P Note (Signed)
History and Physical Interval Note:  12/03/2021 1:07 PM  Martha [name]  has presented today for surgery, with the diagnosis of GERD chronic diarrhea.  The various methods of treatment have been discussed with the patient and family. After consideration of risks, benefits and other options for treatment, the patient has consented to  Procedure(s) with comments: ESOPHAGOGASTRODUODENOSCOPY (EGD) WITH PROPOFOL (N/A) - DM COLONOSCOPY WITH PROPOFOL (N/A) as a surgical intervention.  The patient's history has been reviewed, patient examined, no change in status, stable for surgery.  I have reviewed the patient's chart and labs.  Questions were answered to the patient's satisfaction.     Regis Bill  Ok to proceed with EGD/Colonoscopy

## 2021-12-06 ENCOUNTER — Encounter: Payer: Self-pay | Admitting: Gastroenterology

## 2021-12-06 LAB — SURGICAL PATHOLOGY

## 2021-12-07 ENCOUNTER — Ambulatory Visit: Payer: 59 | Admitting: Psychiatry

## 2021-12-07 ENCOUNTER — Encounter: Payer: Self-pay | Admitting: Psychiatry

## 2021-12-07 DIAGNOSIS — F909 Attention-deficit hyperactivity disorder, unspecified type: Secondary | ICD-10-CM | POA: Diagnosis not present

## 2021-12-07 DIAGNOSIS — F331 Major depressive disorder, recurrent, moderate: Secondary | ICD-10-CM | POA: Diagnosis not present

## 2021-12-07 DIAGNOSIS — G4700 Insomnia, unspecified: Secondary | ICD-10-CM

## 2021-12-07 MED ORDER — METHYLPHENIDATE HCL 10 MG PO TABS: 10.0000 mg | ORAL_TABLET | Freq: Every day | ORAL | 0 refills | Status: DC

## 2021-12-07 MED ORDER — ZALEPLON 10 MG PO CAPS
20.0000 mg | ORAL_CAPSULE | Freq: Every day | ORAL | 2 refills | Status: DC
Start: 2021-12-28 — End: 2022-03-28

## 2021-12-07 NOTE — Patient Instructions (Signed)
Continue Pristiq 100 mg daily  Continue quetiapine 25 mg at night  Continue buspirone 30 mg at night Continue Zaleplon 20 mg at night as needed for sleep Continue Ritalin 10 mg daily  Next appointment: 9/5 at 4 PM

## 2022-01-18 ENCOUNTER — Ambulatory Visit (HOSPITAL_COMMUNITY): Payer: Self-pay | Admitting: Licensed Clinical Social Worker

## 2022-01-28 ENCOUNTER — Inpatient Hospital Stay: Payer: Managed Care, Other (non HMO) | Attending: Oncology

## 2022-01-31 NOTE — Progress Notes (Deleted)
[place] MD/PA/NP OP Progress Note  01/31/2022 6:03 AM Martha [name]  MRN:  [id number]  Chief Complaint: No chief complaint on file.  HPI: *** Visit Diagnosis: No diagnosis found.  Past Psychiatric History: Please see initial evaluation for full details. I have reviewed the history. No updates at this time.     Past Medical History:  Past Medical History:  Diagnosis Date   ADHD (attention deficit hyperactivity disorder)    no meds taken   Allergy    Anxiety    Depression    Diabetes mellitus    pre-diabetic,told when younger   Family history of breast cancer    12/20 cancer genetic testing letter sent   Headache(784.0)    chronic migraines, decrease since BCP started   History of hiatal hernia    Idiopathic intracranial hypertension    Insomnia    Irregular menstrual cycle    pt states she started BCP,unsure of name to help regulate cycle   Obesity    Vision abnormalities    near sighted,wears glasses    Past Surgical History:  Procedure Laterality Date   CHOLECYSTECTOMY     COLONOSCOPY WITH PROPOFOL N/A 12/03/2021   Procedure: COLONOSCOPY WITH PROPOFOL;  Surgeon: Lesly Rubenstein, MD;  Location: [facility] ENDOSCOPY;  Service: Endoscopy;  Laterality: N/A;   ESOPHAGOGASTRODUODENOSCOPY (EGD) WITH PROPOFOL N/A 12/03/2021   Procedure: ESOPHAGOGASTRODUODENOSCOPY (EGD) WITH PROPOFOL;  Surgeon: Lesly Rubenstein, MD;  Location: [facility] ENDOSCOPY;  Service: Endoscopy;  Laterality: N/A;  DM   TONSILLECTOMY     at [place]  01/2012    Family Psychiatric History: Please see initial evaluation for full details. I have reviewed the history. No updates at this time.     Family History:  Family History  Problem Relation Age of Onset   Thyroid cancer Mother 50   Depression Mother    Hypertension Father    Anxiety disorder Father    Diabetes type II Father    Skin cancer Maternal Grandfather    Alzheimer's disease Maternal Grandfather    Alzheimer's disease Paternal  Grandmother    Leukemia Paternal Aunt    Breast cancer Paternal Aunt 61   Hypertension Maternal Grandmother    Diabetes type II Maternal Grandmother     Social History:  Social History   Socioeconomic History   Marital status: Single    Spouse name: Not on file   Number of children: Not on file   Years of education: Not on file   Highest education level: Not on file  Occupational History   Occupation: Ship broker    Comment: [employer] 12th grade  Tobacco Use   Smoking status: Never   Smokeless tobacco: Never  Vaping Use   Vaping Use: Former   Quit date: 10/30/2018  Substance and Sexual Activity   Alcohol use: Yes    Comment: occ   Drug use: No   Sexual activity: Not Currently    Partners: Male    Birth control/protection: Pill  Other Topics Concern   Not on file  Social History Narrative   Not on file   Social Determinants of Health   Financial Resource Strain: Not on file  Food Insecurity: Not on file  Transportation Needs: Not on file  Physical Activity: Inactive (04/07/2017)   Exercise Vital Sign    Days of Exercise per Week: 0 days    Minutes of Exercise per Session: 0 min  Stress: Stress Concern Present (04/07/2017)   [employer]  of Occupational [employer] - Occupational Stress Questionnaire    Feeling of Stress : To some extent  Social Connections: Somewhat Isolated (04/07/2017)   Social Connection and Isolation Panel [NHANES]    Frequency of Communication with Friends and Family: Once a week    Frequency of Social Gatherings with Friends and Family: More than three times a week    Attends Religious Services: Never    Database administrator or Organizations: Yes    Attends Engineer, structural: More than 4 times per year    Marital Status: Never married    Allergies:  Allergies  Allergen Reactions   Lamisil [Terbinafine]     Metabolic Disorder Labs: Lab Results  Component Value Date   HGBA1C 6.5 (H) 04/19/2021   No results found  for: "PROLACTIN" Lab Results  Component Value Date   CHOL 151 04/20/2020   TRIG 136 04/20/2020   HDL 40 04/20/2020   CHOLHDL 3.8 04/20/2020   LDLCALC 87 04/20/2020   Lab Results  Component Value Date   TSH 6.670 (H) 04/20/2020   TSH 2.405 02/23/2015    Therapeutic Level Labs: No results found for: "LITHIUM" No results found for: "VALPROATE" No results found for: "CBMZ"  Current Medications: Current Outpatient Medications  Medication Sig Dispense Refill   ascorbic acid (VITAMIN C) 1000 MG tablet Take 1,000 mg by mouth daily.     busPIRone (BUSPAR) 30 MG tablet Take 1 tablet (30 mg total) by mouth at bedtime. 90 tablet 0   calcium carbonate (OSCAL) 1500 (600 Ca) MG TABS tablet Take 1,500 mg by mouth 2 (two) times daily with a meal.     cetirizine (ZYRTEC) 10 MG tablet Take 10 mg by mouth daily.     Cholecalciferol (VITAMIN D-1000 MAX ST) 25 MCG (1000 UT) tablet Take 1,000 Units by mouth daily.     colestipol (COLESTID) 1 g tablet Take by mouth. (Patient not taking: Reported on 10/29/2021)     desvenlafaxine (PRISTIQ) 100 MG 24 hr tablet Take 1 tablet (100 mg total) by mouth daily. 90 tablet 1   hydrochlorothiazide (MICROZIDE) 12.5 MG capsule Take 12.5 mg by mouth daily.     levonorgestrel-ethinyl estradiol (INTROVALE) 0.15-0.03 MG tablet Take 1 tablet by mouth daily. 91 tablet 4   levothyroxine (SYNTHROID) 50 MCG tablet Take 50 mcg by mouth daily before breakfast.     LORazepam (ATIVAN) 1 MG tablet Take by mouth.     magnesium oxide (MAG-OX) 400 MG tablet Take by mouth.     meloxicam (MOBIC) 7.5 MG tablet Take up to 1-2 tablets daily by mouth as needed for pain.     metFORMIN (GLUCOPHAGE) 500 MG tablet Take 250 mg by mouth daily.     methylphenidate (RITALIN) 10 MG tablet Take 1 tablet (10 mg total) by mouth daily before breakfast. (Patient not taking: Reported on 11/19/2021) 30 tablet 0   methylphenidate (RITALIN) 10 MG tablet Take 1 tablet (10 mg total) by mouth daily. 30 tablet 0    methylphenidate (RITALIN) 10 MG tablet Take 1 tablet (10 mg total) by mouth daily before breakfast. 30 tablet 0   methylphenidate (RITALIN) 10 MG tablet Take 1 tablet (10 mg total) by mouth daily before breakfast. 30 tablet 0   Multiple Vitamin (MULTI-VITAMIN) tablet Take by mouth.     Na Sulfate-K Sulfate-Mg Sulf 17.5-3.13-1.6 GM/177ML SOLN Take by mouth. (Patient not taking: Reported on 10/29/2021)     omeprazole (PRILOSEC OTC) 20 MG tablet Take 20 mg by mouth  in the morning and at bedtime.     predniSONE (DELTASONE) 10 MG tablet Take 10 mg by mouth daily with breakfast. (Patient not taking: Reported on 12/03/2021)     promethazine (PHENERGAN) 12.5 MG tablet Take 12.5 mg by mouth every 6 (six) hours as needed for nausea or vomiting.     QUEtiapine (SEROQUEL) 25 MG tablet Take 1 tablet (25 mg total) by mouth at bedtime. 90 tablet 0   spironolactone (ALDACTONE) 100 MG tablet TK 1 T PO QD B NOON  9   vitamin B-12 (CYANOCOBALAMIN) 1000 MCG tablet Take 1,000 mcg by mouth daily.     zaleplon (SONATA) 10 MG capsule Take 2 capsules (20 mg total) by mouth at bedtime. 60 capsule 2   No current facility-administered medications for this visit.     Musculoskeletal: Strength & Muscle Tone: within normal limits Gait & Station: normal Patient leans: N/A  Psychiatric Specialty Exam: Review of Systems  There were no vitals taken for this visit.There is no height or weight on file to calculate BMI.  General Appearance: {Appearance:22683}  Eye Contact:  {BHH EYE CONTACT:22684}  Speech:  Clear and Coherent  Volume:  Normal  Mood:  {BHH MOOD:22306}  Affect:  {Affect (PAA):22687}  Thought Process:  Coherent  Orientation:  Full (Time, Place, and Person)  Thought Content: Logical   Suicidal Thoughts:  {ST/HT (PAA):22692}  Homicidal Thoughts:  {ST/HT (PAA):22692}  Memory:  Negative  Judgement:  {Judgement (PAA):22694}  Insight:  {Insight (PAA):22695}  Psychomotor Activity:  Normal  Concentration:   Concentration: Good and Attention Span: Good  Recall:  Good  Fund of Knowledge: Good  Language: Good  Akathisia:  No  Handed:  Right  AIMS (if indicated): not done  Assets:  Communication Skills Desire for Improvement  ADL's:  Intact  Cognition: WNL  Sleep:  {BHH GOOD/FAIR/POOR:22877}   Screenings: GAD-7    Flowsheet Row Office Visit from 04/19/2021 in [facility] Office Visit from 04/16/2020 in [facility] Office Visit from 04/15/2019 in [facility]  Total GAD-7 Score 7 7 10       PHQ2-9    Flowsheet Row Office Visit from 12/07/2021 in [facility] Office Visit from 10/07/2021 in [facility] Counselor from 06/21/2021 in [facility] Office Visit from 04/19/2021 in [facility] Video Visit from 02/03/2021 in [facility]  PHQ-2 Total Score 3 2 1 2 2   PHQ-9 Total Score 11 12 -- 11 12      Flowsheet Row Office Visit from 12/07/2021 in [facility] Admission (Discharged) from 12/03/2021 in [facility] Counselor from 11/10/2021 in [facility]  C-SSRS RISK CATEGORY Error: Q3, 4, or 5 should not be populated when Q2 is No No Risk No Risk        Assessment and Plan:  Martha [name] is a 26 y.o. year old female with a history of depression, anxiety, ADHD by history, migraine, GERD, who presents for follow up appointment for below.     1. Moderate episode of recurrent major depressive disorder (HCC) Although she continues to report occasional depressive symptoms in the context of stress at work and medical evaluation/upcoming dental procedure , she has been handling things relatively well. Other psychosocial stressors includes loss of her friends, family including her aunt, financial strain, conflict with her father, medical condition of her grandmother  with Alzheimer disease.  Will continue Pristiq and quetiapine to target  depression.  Will continue BuSpar for anxiety.    2. Insomnia, unspecified type Unchanged.  According to the chart review, she was diagnosed with obstructive sleep apnea, and PAP titration was recommended in 2021.  She was advised to contact her provider for further evaluation/treatment.  She reports good benefit from zaleplon; will continue current dose as needed for insomnia.    3. Attention deficit hyperactivity disorder (ADHD), unspecified ADHD type Neuropsych testing was consistent with ADHD.  Will continue current dose of Ritalin to target ADHD.    Plan  Continue Pristiq 100 mg daily  Continue quetiapine 25 mg at night - monitor weight gain Continue buspirone 30 mg at night Continue Zaleplon 20 mg at night as needed for sleep Continue Ritalin 10 mg daily  Next appointment: 9/5 at 4 PM for 30 mins, in person   Past trials of medication: sertraline, Pristiq, bupropion (some adverse reaction in the past), Abilify (visual change)     The patient demonstrates the following risk factors for suicide: Chronic risk factors for suicide include: psychiatric disorder of depression, anxiety. Acute risk factors for suicide include: family or marital conflict. Protective factors for this patient include: positive social support, coping skills and hope for the future. Considering these factors, the overall suicide risk at this point appears to be low. Patient is appropriate for outpatient follow up.     Collaboration of Care: Collaboration of Care: {BH OP Collaboration of Care:21014065}  Patient/Guardian was advised Release of Information must be obtained prior to any record release in order to collaborate their care with an outside provider. Patient/Guardian was advised if they have not already done so to contact the registration department to sign all necessary forms in order for [place] to release information regarding their care.    Consent: Patient/Guardian gives verbal consent for treatment and assignment of benefits for services provided during this visit. Patient/Guardian expressed understanding and agreed to proceed.    Neysa Hotter, MD 01/31/2022, 6:03 AM

## 2022-02-01 ENCOUNTER — Ambulatory Visit: Payer: 59 | Admitting: Psychiatry

## 2022-02-07 ENCOUNTER — Ambulatory Visit (HOSPITAL_COMMUNITY): Payer: Self-pay | Admitting: Licensed Clinical Social Worker

## 2022-02-09 NOTE — Progress Notes (Signed)
BH MD/PA/NP OP Progress Note  02/10/2022 5:25 PM GIRTIE WIERSMA  MRN:  [id number]  Chief Complaint:  Chief Complaint  Patient presents with   Follow-up   HPI:  This is a follow-up appointment for depression and insomnia.  She states that she was found out to have enlarged thyroid.  She will have more evaluation.  She is very concerned about this due to her history of mother, who had thyroid cancer.  She has been also busy at work due to [employer] of people. She reports more frequent passive SI, although she denies any intent or plan.  She has these thoughts prior to be found of issues with her thyroid.  She tries to keep her mind busy such as doing lego.  She does not do exercise as she tends to pass out without known reason. The patient has mood symptoms as in PHQ-9/GAD-7. She has difficulty in concentration, although she has been able to manage things. She has decrease in appetite.  She is planning to have sleep study.  She enjoys Sport and exercise psychologist and games.  She rarely drinks alcohol.  She denies drug use. She would like medication adjustment at this time.  She is willing to try higher dose of quetiapine.   Wt Readings from Last 3 Encounters:  02/10/22 (!) 368 lb (166.9 kg)  12/03/21 (!) 355 lb (161 kg)  10/07/21 (!) 362 lb (164.2 kg)     Visit Diagnosis:    ICD-10-CM   1. Moderate episode of recurrent major depressive disorder (HCC)  F33.1     2. Insomnia, unspecified type  G47.00     3. Attention deficit hyperactivity disorder (ADHD), unspecified ADHD type  F90.9       Past Psychiatric History: Please see initial evaluation for full details. I have reviewed the history. No updates at this time.     Past Medical History:  Past Medical History:  Diagnosis Date   ADHD (attention deficit hyperactivity disorder)    no meds taken   Allergy    Anxiety    Depression    Diabetes mellitus    pre-diabetic,told when younger   Family history of breast cancer    12/20 cancer genetic  testing letter sent   Headache(784.0)    chronic migraines, decrease since BCP started   History of hiatal hernia    Idiopathic intracranial hypertension    Insomnia    Irregular menstrual cycle    pt states she started BCP,unsure of name to help regulate cycle   Obesity    Vision abnormalities    near sighted,wears glasses    Past Surgical History:  Procedure Laterality Date   CHOLECYSTECTOMY     COLONOSCOPY WITH PROPOFOL N/A 12/03/2021   Procedure: COLONOSCOPY WITH PROPOFOL;  Surgeon: Regis Bill, MD;  Location: [facility] END[facility]COPY;  Service: Endoscopy;  Laterality: N/A;   ESOPHAGOGASTRODUODENOSCOPY (EGD) WITH PROPOFOL N/A 12/03/2021   Procedure: ESOPHAGOGASTRODUODENOSCOPY (EGD) WITH PROPOFOL;  Surgeon: Regis Bill, MD;  Location: [facility] ENDOSCOPY;  Service: Endoscopy;  Laterality: N/A;  DM   TONSILLECTOMY     at 26yo   UPPER GI ENDOSCOPY  01/2012    Family Psychiatric History: Please see initial evaluation for full details. I have reviewed the history. No updates at this time.     Family History:  Family History  Problem Relation Age of Onset   Thyroid cancer Mother 67   Depression Mother    Hypertension Father    Anxiety disorder Father    Diabetes  type II Father    Skin cancer Maternal Grandfather    Alzheimer's disease Maternal Grandfather    Alzheimer's disease Paternal Grandmother    Leukemia Paternal Aunt    Breast cancer Paternal Aunt 40   Hypertension Maternal Grandmother    Diabetes type II Maternal Grandmother     Social History:  Social History   Socioeconomic History   Marital status: Single    Spouse name: Not on file   Number of children: Not on file   Years of education: Not on file   Highest education level: Not on file  Occupational History   Occupation: Consulting civil engineer    Comment: [employer] 12th grade  Tobacco Use   Smoking status: Never   Smokeless tobacco: Never  Vaping Use   Vaping Use: Former   Quit date: 10/30/2018  Substance  and Sexual Activity   Alcohol use: Yes    Comment: occ   Drug use: No   Sexual activity: Not Currently    Partners: Male    Birth control/protection: Pill  Other Topics Concern   Not on file  Social History Narrative   Not on file   Social Determinants of Health   Financial Resource Strain: Not on file  Food Insecurity: Not on file  Transportation Needs: Not on file  Physical Activity: Inactive (04/07/2017)   Exercise Vital Sign    Days of Exercise per Week: 0 days    Minutes of Exercise per Session: 0 min  Stress: Stress Concern Present (04/07/2017)   [employer] of Occupational Health - Occupational Stress Questionnaire    Feeling of Stress : To some extent  Social Connections: Somewhat Isolated (04/07/2017)   Social Connection and Isolation Panel [NHANES]    Frequency of Communication with Friends and Family: Once a week    Frequency of Social Gatherings with Friends and Family: More than three times a week    Attends Religious Services: Never    Database administrator or Organizations: Yes    Attends Engineer, structural: More than 4 times per year    Marital Status: Never married    Allergies:  Allergies  Allergen Reactions   Lamisil [Terbinafine]     Metabolic Disorder Labs: Lab Results  Component Value Date   HGBA1C 6.5 (H) 04/19/2021   No results found for: "PROLACTIN" Lab Results  Component Value Date   CHOL 151 04/20/2020   TRIG 136 04/20/2020   HDL 40 04/20/2020   CHOLHDL 3.8 04/20/2020   LDLCALC 87 04/20/2020   Lab Results  Component Value Date   TSH 6.670 (H) 04/20/2020   TSH 2.405 02/23/2015    Therapeutic Level Labs: No results found for: "LITHIUM" No results found for: "VALPROATE" No results found for: "CBMZ"  Current Medications: Current Outpatient Medications  Medication Sig Dispense Refill   ascorbic acid (VITAMIN C) 1000 MG tablet Take 1,000 mg by mouth daily.     busPIRone (BUSPAR) 30 MG tablet Take 1 tablet (30 mg  total) by mouth at bedtime. 90 tablet 0   calcium carbonate (OSCAL) 1500 (600 Ca) MG TABS tablet Take 1,500 mg by mouth 2 (two) times daily with a meal.     cetirizine (ZYRTEC) 10 MG tablet Take 10 mg by mouth daily.     Cholecalciferol (VITAMIN D-1000 MAX ST) 25 MCG (1000 UT) tablet Take 1,000 Units by mouth daily.     colestipol (COLESTID) 1 g tablet Take by mouth.     desvenlafaxine (PRISTIQ) 100 MG  24 hr tablet Take 1 tablet (100 mg total) by mouth daily. 90 tablet 1   hydrochlorothiazide (MICROZIDE) 12.5 MG capsule Take 12.5 mg by mouth daily.     levonorgestrel-ethinyl estradiol (INTROVALE) 0.15-0.03 MG tablet Take 1 tablet by mouth daily. 91 tablet 4   levothyroxine (SYNTHROID) 50 MCG tablet Take 50 mcg by mouth daily before breakfast.     LORazepam (ATIVAN) 1 MG tablet Take by mouth.     magnesium oxide (MAG-OX) 400 MG tablet Take by mouth.     meloxicam (MOBIC) 7.5 MG tablet Take up to 1-2 tablets daily by mouth as needed for pain.     metFORMIN (GLUCOPHAGE) 500 MG tablet Take 250 mg by mouth daily.     methylphenidate (RITALIN) 10 MG tablet Take 1 tablet (10 mg total) by mouth daily before breakfast. 30 tablet 0   methylphenidate (RITALIN) 10 MG tablet Take 1 tablet (10 mg total) by mouth daily before breakfast. 30 tablet 0   [START ON 03/12/2022] methylphenidate (RITALIN) 10 MG tablet Take 1 tablet (10 mg total) by mouth daily before breakfast. 30 tablet 0   [START ON 04/11/2022] methylphenidate (RITALIN) 10 MG tablet Take 1 tablet (10 mg total) by mouth daily before breakfast. 30 tablet 0   Multiple Vitamin (MULTI-VITAMIN) tablet Take by mouth.     Na Sulfate-K Sulfate-Mg Sulf 17.5-3.13-1.6 GM/177ML SOLN Take by mouth.     omeprazole (PRILOSEC OTC) 20 MG tablet Take 20 mg by mouth in the morning and at bedtime.     predniSONE (DELTASONE) 10 MG tablet Take 10 mg by mouth daily with breakfast.     promethazine (PHENERGAN) 12.5 MG tablet Take 12.5 mg by mouth every 6 (six) hours as  needed for nausea or vomiting.     spironolactone (ALDACTONE) 100 MG tablet TK 1 T PO QD B NOON  9   vitamin B-12 (CYANOCOBALAMIN) 1000 MCG tablet Take 1,000 mcg by mouth daily.     zaleplon (SONATA) 10 MG capsule Take 2 capsules (20 mg total) by mouth at bedtime. 60 capsule 2   [START ON 03/30/2022] zaleplon (SONATA) 10 MG capsule Take 1 capsule (10 mg total) by mouth at bedtime as needed for sleep. 30 capsule 0   QUEtiapine (SEROQUEL) 50 MG tablet Take 1 tablet (50 mg total) by mouth at bedtime. 90 tablet 0   No current facility-administered medications for this visit.     Musculoskeletal: Strength & Muscle Tone: within normal limits Gait & Station: normal Patient leans: N/A  Psychiatric Specialty Exam: Review of Systems  Psychiatric/Behavioral:  Positive for decreased concentration, dysphoric mood and sleep disturbance. Negative for agitation, behavioral problems, confusion, hallucinations, self-injury and suicidal ideas. The patient is nervous/anxious. The patient is not hyperactive.   All other systems reviewed and are negative.   Blood pressure 137/84, pulse 93, height 5\' 8"  (1.727 m), weight (!) 368 lb (166.9 kg).Body mass index is 55.95 kg/m.  General Appearance: Fairly Groomed  Eye Contact:  Good  Speech:  Clear and Coherent  Volume:  Normal  Mood:  Anxious and Depressed  Affect:  Appropriate, Congruent, and slightly tense  Thought Process:  Coherent  Orientation:  Full (Time, Place, and Person)  Thought Content: Logical   Suicidal Thoughts:  Yes.  without intent/plan  Homicidal Thoughts:  No  Memory:  Immediate;   Good  Judgement:  Good  Insight:  Good  Psychomotor Activity:  Normal  Concentration:  Concentration: Good and Attention Span: Good  Recall:  Good  Fund of Knowledge: Good  Language: Good  Akathisia:  No  Handed:  Right  AIMS (if indicated): not done  Assets:  Communication Skills Desire for Improvement  ADL's:  Intact  Cognition: WNL  Sleep:   fair    Screenings: GAD-7    Flowsheet Row Office Visit from 02/10/2022 in [place] Office Visit from 04/19/2021 in [facility] Office Visit from 04/16/2020 in [facility] Office Visit from 04/15/2019 in [facility]  Total GAD-7 Score 9 7 7 10       PHQ2-9    [place] Visit from 02/10/2022 in [place] Office Visit from 12/07/2021 in [place] Office Visit from 10/07/2021 in [facility] from 06/21/2021 in [place] Office Visit from 04/19/2021 in [facility]  PHQ-2 Total Score 2 3 2 1 2   PHQ-9 Total Score 14 11 12  -- [place] Office Visit from 02/10/2022 in [place] Office Visit from 12/07/2021 in [place] Admission (Discharged) from 12/03/2021 in [place] Error: Q3, 4, or 5 should not be populated when Q2 is No Error: Q3, 4, or 5 should not be populated when Q2 is No No Risk        Assessment and Plan:  LEZA ASHDOWN is a 26 y.o. year old female with a history of depression, anxiety, ADHD by history, migraine, GERD, who presents for follow up appointment for below.   1. Moderate episode of recurrent major depressive disorder (HCC) Exam is notable for tense affect, and there has been worsening in depressive symptoms with passive SI without significant triggers.  Recent psychosocial stressors includes undergoing evaluation of potential side with a mass. Other psychosocial stressors includes loss of her friends, family including her aunt, financial strain, conflict with her father, medical condition of her grandmother with Alzheimer disease.  Will uptitrate quetiapine to optimize treatment for depression, anxiety.  Discussed potential metabolic side effect  and EPS.  The hope is to use this medication only for short-term while she undergoes evaluation of thyroid.  Will continue Pristiq for depression.  Will continue BuSpar for anxiety.   2. Insomnia, unspecified type Unchanged.  According to the chart review, she was diagnosed with obstructive sleep apnea, and PAP titration was recommended in 2021 She will have evaluation of sleep apnea.  Will continue zaleplon as needed for insomnia at this time.   3. Attention deficit hyperactivity disorder (ADHD), unspecified ADHD type Neuropsych testing was consistent with ADHD.  Will continue current dose of Ritalin to target ADHD.     Plan  Continue Pristiq 100 mg daily  Increase quetiapine 50 mg at night - monitor weight gain Continue buspirone 30 mg at night Continue Zaleplon 20 mg at night as needed for sleep Continue Ritalin 10 mg daily  Next appointment: 11/16 at 3:30 for 30 mins, in person Emergency resources which includes 911, ED, suicide crisis line [phone]) are discussed.     Past trials of medication: sertraline, Pristiq, bupropion (some adverse reaction in the past), Abilify (visual change)     I have reviewed suicide assessment in detail. No change in the following assessment.    The patient demonstrates the following risk factors for suicide: Chronic risk factors for suicide include: psychiatric disorder of depression, anxiety. Acute risk factors for suicide include: family or marital conflict. Protective factors for  this patient include: positive social support, coping skills and hope for the future. Considering these factors, the overall suicide risk at this point appears to be low. Patient is appropriate for outpatient follow up.        Collaboration of Care: Collaboration of Care: Other N/A  Patient/Guardian was advised Release of Information must be obtained prior to any record release in order to collaborate their care with an outside provider. Patient/Guardian was advised  if they have not already done so to contact the registration department to sign all necessary forms in order for [place] to release information regarding their care.   Consent: Patient/Guardian gives verbal consent for treatment and assignment of benefits for services provided during this visit. Patient/Guardian expressed understanding and agreed to proceed.    Norman Clay, MD 02/10/2022, 5:25 PM

## 2022-02-10 ENCOUNTER — Encounter: Payer: Self-pay | Admitting: Psychiatry

## 2022-02-10 ENCOUNTER — Ambulatory Visit: Payer: 59 | Admitting: Psychiatry

## 2022-02-10 VITALS — BP 137/84 | HR 93 | Ht 68.0 in | Wt 368.0 lb

## 2022-02-10 DIAGNOSIS — G4700 Insomnia, unspecified: Secondary | ICD-10-CM

## 2022-02-10 DIAGNOSIS — F909 Attention-deficit hyperactivity disorder, unspecified type: Secondary | ICD-10-CM | POA: Diagnosis not present

## 2022-02-10 DIAGNOSIS — F331 Major depressive disorder, recurrent, moderate: Secondary | ICD-10-CM

## 2022-02-10 MED ORDER — METHYLPHENIDATE HCL 10 MG PO TABS: 10.0000 mg | ORAL_TABLET | Freq: Every day | ORAL | 0 refills | Status: DC

## 2022-02-10 MED ORDER — ZALEPLON 10 MG PO CAPS
10.0000 mg | ORAL_CAPSULE | Freq: Every evening | ORAL | 0 refills | Status: DC | PRN
Start: 2022-03-30 — End: 2022-04-14

## 2022-02-10 MED ORDER — BUSPIRONE HCL 30 MG PO TABS: 30.0000 mg | ORAL_TABLET | Freq: Every day | ORAL | 0 refills | Status: DC

## 2022-02-10 MED ORDER — QUETIAPINE FUMARATE 50 MG PO TABS
50.0000 mg | ORAL_TABLET | Freq: Every day | ORAL | 0 refills | Status: DC
Start: 2022-02-10 — End: 2022-04-14

## 2022-02-10 NOTE — Patient Instructions (Signed)
Continue Pristiq 100 mg daily  Increase quetiapine 50 mg at night Continue buspirone 30 mg at night Continue Zaleplon 20 mg at night as needed for sleep Continue Ritalin 10 mg daily  Next appointment: 11/16 at 3:30

## 2022-02-22 ENCOUNTER — Ambulatory Visit (INDEPENDENT_AMBULATORY_CARE_PROVIDER_SITE_OTHER): Payer: 59 | Admitting: Licensed Clinical Social Worker

## 2022-02-22 DIAGNOSIS — F411 Generalized anxiety disorder: Secondary | ICD-10-CM

## 2022-02-22 DIAGNOSIS — F331 Major depressive disorder, recurrent, moderate: Secondary | ICD-10-CM

## 2022-02-22 NOTE — Progress Notes (Unsigned)
Virtual Visit via Video Note  I connected with Martha [name] on 02/22/22 at  4:00 PM EDT by a video enabled telemedicine application and verified that I am speaking with the correct person using two identifiers.  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 The patient expressed understanding and agreed to proceed.   I discussed the assessment and treatment plan with the patient. The patient was provided an opportunity to ask questions and all were answered. The patient agreed with the plan and demonstrated an understanding of the instructions.   The patient was advised to call back or seek an in-person evaluation if the symptoms worsen or if the condition fails to improve as anticipated.  I provided 40 minutes of non-face-to-face time during this encounter.   Martha Whitelaw R Nevea Spiewak, LCSW   THERAPIST PROGRESS NOTE  Session Time: 28-440p  Participation Level: None  Behavioral Response: Neat and Well GroomedAlertAnxious  Type of Therapy: Individual Therapy  Treatment Goals addressed: Problem: Decrease depressive symptoms and improve levels of effective functioning Goal: LTG: Reduce frequency, intensity, and duration of depression symptoms as evidenced by: SSB input needed on appropriate metric Outcome: Progressing Goal: STG: Martha [name] WILL PARTICIPATE IN AT LEAST 80% OF SCHEDULED INDIVIDUAL PSYCHOTHERAPY SESSIONS Outcome: Progressing Intervention: WORK WITH Martha [name] TO IDENTIFY THE MAJOR COMPONENTS OF A RECENT EPISODE OF DEPRESSION: PHYSICAL SYMPTOMS, MAJOR THOUGHTS AND IMAGES, AND MAJOR BEHAVIORS THEY EXPERIENCED Note: Reviewed  Intervention: Encourage verbalization of feelings/concerns/expectations Note: Reviewed  Intervention: Encourage self-care activities Note: Reviewed   ProgressTowards Goals: Progressing  Interventions: CBT  Summary:  Martha [name] is a 26 y.o. female who presents with improving symptoms related to depression and anxiety. Pt reports that overall mood has been stable and that she is managing stress and anxiety well.   Allowed pt to explore and express thoughts and feelings associated with recent life situations and external stressors.Patient reports that she is having some stress associated with upcoming fall festival at her church. 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 Patient reports that it is a lot of work, but her family works well together and it ends up being a big positive community event.  Patient reports that she is enjoying participating at [employer] with singing. Martha [name] ports that she has been asked to sing a solo at Christmas, and is excited about it. Encourage patient to continue with activities that make her feel good about herself and improve overall self esteem.   Patient reports that she is currently having a health scare regarding her thyroid. Patient reports that she has nodules on her thyroid that she is concerned about--especially since her mother lost her thyroid to thyroid cancer. Patient feels that she is having multiple thyroid related symptoms including: hair loss, weight gain, low energy, difficulty swallowing. patient reports that she wasn't happy with one physician's recommendation of yearly ultrasounds to monitor, and is seeking a second opinion. Encourage patient to continue being her own advocate for her health care.  review coping skills for managing depression symptoms and anxiety symptoms.   Continued recommendations are as follows: self care behaviors, positive social engagements, focusing on overall work/home/life balance, and focusing on positive physical and emotional wellness.   Suicidal/Homicidal: No  Therapist Response: Pt  is  continuing to apply interventions learned in session into daily life situations. Pt is currently on track to meet goals utilizing interventions mentioned above. Personal growth and progress noted. Treatment to continue as indicated.   Plan: Return again in 4 weeks.  Diagnosis:  Encounter Diagnoses  Name Primary?   Moderate episode of recurrent major depressive disorder (HCC) Yes   Generalized anxiety disorder     Collaboration of Care: Other pt encouraged to continue psychiatric services with current provider, Dr. Vanetta Shawl  Patient/Guardian was advised Release of Information must be obtained prior to any record release in order to collaborate their care with an outside provider. Patient/Guardian was advised if they have not already done so to contact the registration department to sign all necessary forms in order for [place] to release information regarding their care.   Consent: Patient/Guardian gives verbal consent for treatment and assignment of benefits for services provided during this visit. Patient/Guardian expressed understanding and agreed to proceed.   Martha Haber Alfonzo Arca, LCSW 02/22/2022

## 2022-02-22 NOTE — Plan of Care (Signed)
  Problem: Decrease depressive symptoms and improve levels of effective functioning Goal: LTG: Reduce frequency, intensity, and duration of depression symptoms as evidenced by: SSB input needed on appropriate metric Outcome: Progressing Goal: STG: Omnia WILL PARTICIPATE IN AT LEAST 80% OF SCHEDULED INDIVIDUAL PSYCHOTHERAPY SESSIONS Outcome: Progressing Intervention: WORK WITH Ileanna TO IDENTIFY THE MAJOR COMPONENTS OF A RECENT EPISODE OF DEPRESSION: PHYSICAL SYMPTOMS, MAJOR THOUGHTS AND IMAGES, AND MAJOR BEHAVIORS THEY EXPERIENCED Note: Reviewed  Intervention: Encourage verbalization of feelings/concerns/expectations Note: Reviewed  Intervention: Encourage self-care activities Note: Reviewed

## 2022-03-22 ENCOUNTER — Other Ambulatory Visit: Payer: Self-pay | Admitting: Otolaryngology

## 2022-03-22 ENCOUNTER — Ambulatory Visit
Admission: RE | Admit: 2022-03-22 | Discharge: 2022-03-22 | Disposition: A | Discharge status: Home / Self Care | Payer: Managed Care, Other (non HMO) | Source: Ambulatory Visit | Attending: Otolaryngology | Admitting: Otolaryngology

## 2022-03-22 DIAGNOSIS — R0689 Other abnormalities of breathing: Secondary | ICD-10-CM

## 2022-03-22 DIAGNOSIS — E042 Nontoxic multinodular goiter: Secondary | ICD-10-CM | POA: Diagnosis present

## 2022-03-28 ENCOUNTER — Other Ambulatory Visit: Payer: Self-pay | Admitting: Psychiatry

## 2022-03-28 ENCOUNTER — Telehealth: Payer: Self-pay | Admitting: Psychiatry

## 2022-03-28 MED ORDER — ZALEPLON 10 MG PO CAPS: 20.0000 mg | ORAL_CAPSULE | Freq: Every day | ORAL | 0 refills | Status: DC

## 2022-03-28 NOTE — Telephone Encounter (Signed)
Sorry it appears to be ordered incorrectly. Ordered medication to allow her to take two pills at night. Please advise her to continue to take two tabs (total of 20 mg), and get this new prescription.

## 2022-03-28 NOTE — Telephone Encounter (Signed)
Patient stopped by office. Picked up prescription over the weekend for Zaleplon but did not get complete prescription. Wrote order for 1 at bedtime but she takes 2 at bedtime. Please send prescription to be filled.

## 2022-04-06 ENCOUNTER — Ambulatory Visit (INDEPENDENT_AMBULATORY_CARE_PROVIDER_SITE_OTHER): Payer: 59 | Admitting: Licensed Clinical Social Worker

## 2022-04-06 DIAGNOSIS — F3341 Major depressive disorder, recurrent, in partial remission: Secondary | ICD-10-CM

## 2022-04-06 DIAGNOSIS — F411 Generalized anxiety disorder: Secondary | ICD-10-CM | POA: Diagnosis not present

## 2022-04-06 NOTE — Progress Notes (Signed)
Virtual Visit via Video Note  I connected with Eliya Bubar Mccarrick on 04/06/22 at  4:00 PM EST by a video enabled telemedicine application and verified that I am speaking with the correct person using two identifiers.  Location: Patient: work   Provider: Programmer, multimedia [employer]   I discussed the limitations of evaluation and management by telemedicine and the availability of in person appointments. The patient expressed understanding and agreed to proceed.   I discussed the assessment and treatment plan with the patient. The patient was provided an opportunity to ask questions and all were answered. The patient agreed with the plan and demonstrated an understanding of the instructions.Patient reports that she recently had a second ultrasound on her thyroid after results from the initial thyroid could not be found. Patient reports that the day that she had the second procedure done, the results of the first procedure were found. Patient reports that there was a discrepancy in the first ultrasound in the second ultrasound. Patient reports that there was no evidence of nodules in the second ultrasound compared to the initial one. Patient reports that instead of this being a positive report, she feels more anxious about it, and wants another procedure done just to rule out any mistakes.  Patient reports that things are going well at work, but patient admits that she has called in sick before when she has felt like "I just can't deal with people". Patient admits that she did not call--HR called her to inquire why she wasn't at work. Discussed how continuing behaviors could lead to work related consequences. Patient reflects understanding and does not want to continue missing work.  Patient reports that she really does not have the time or energy to do any physical activity, or any other help related behaviors. Encouraged patient to work on self-care, and overall emotional and physical  wellness.    The patient was advised to call back or seek an in-person evaluation if the symptoms worsen or if the condition fails to improve as anticipated.  I provided 35 minutes of non-face-to-face time during this encounter.   Haeley Fordham R Aritha Huckeba, LCSW   THERAPIST PROGRESS NOTE  Session Time: 26-435p  Participation Level: None  Behavioral Response: Neat and Well GroomedAlertAnxious  Type of Therapy: Individual Therapy  Treatment Goals addressed:    Problem: Decrease depressive symptoms and improve levels of effective functioning Goal: LTG: Reduce frequency, intensity, and duration of depression symptoms as evidenced by: SSB input needed on appropriate metric Outcome: Progressing Goal: STG: Torryn WILL PARTICIPATE IN AT LEAST 80% OF SCHEDULED INDIVIDUAL PSYCHOTHERAPY SESSIONS Outcome: Progressing Intervention: Encourage verbalization of feelings/concerns/expectations Note: Allowed pt to explore/express Intervention: Encourage self-care activities Note: Reviewed    ProgressTowards Goals: Progressing  Interventions: CBT  Summary: SHERNELL SALDIERNA is a 26 y.o. female who presents with improving symptoms related to depression and anxiety. Pt reports that overall mood has been stable and that she is managing stress and anxiety well.   Allowed pt to explore and express thoughts and feelings associated with recent life situations and external stressors.Patient reports that she recently had a second ultrasound on her thyroid after results from the initial thyroid could not be found. Patient reports that the day that she had the second procedure done, the results of the first procedure were found. Patient reports that there was a discrepancy in the first ultrasound in the second ultrasound. Patient reports that there was no evidence of nodules in the second ultrasound compared to the initial one.  Patient reports that instead of this being a positive report, she feels more anxious about  it, and wants another procedure done just to rule out any mistakes.  Patient reports that things are going well at work, but patient admits that she has called in sick before when she has felt like "I just can't deal with people". Patient admits that she did not call--HR called her to inquire why she wasn't at work. Discussed how continuing behaviors could lead to work related consequences. Patient reflects understanding and does not want to continue missing work.  Patient reports that she really does not have the time or energy to do any physical activity, or any other help related behaviors. Encouraged patient to work on self-care, and overall emotional and physical Wellness.   Continued recommendations are as follows: self care behaviors, positive social engagements, focusing on overall work/home/life balance, and focusing on positive physical and emotional wellness.   Suicidal/Homicidal: No  Therapist Response: Pt is continuing to apply interventions learned in session into daily life situations. Pt is currently on track to meet goals utilizing interventions mentioned above. Personal growth and progress noted. Treatment to continue as indicated.   Plan: Return again in 4 weeks.  Diagnosis:  Encounter Diagnoses  Name Primary?   Recurrent major depressive disorder, in partial remission (HCC) Yes   Generalized anxiety disorder     Collaboration of Care: Other pt encouraged to continue psychiatric services with current provider, Dr. Vanetta Shawl  Patient/Guardian was advised Release of Information must be obtained prior to any record release in order to collaborate their care with an outside provider. Patient/Guardian was advised if they have not already done so to contact the registration department to sign all necessary forms in order for [place] to release information regarding their care.   Consent: Patient/Guardian gives verbal consent for treatment and assignment of benefits for services  provided during this visit. Patient/Guardian expressed understanding and agreed to proceed.   Ernest Haber Kaysa Roulhac, LCSW 04/06/2022

## 2022-04-08 NOTE — Plan of Care (Signed)
  Problem: Decrease depressive symptoms and improve levels of effective functioning Goal: LTG: Reduce frequency, intensity, and duration of depression symptoms as evidenced by: SSB input needed on appropriate metric Outcome: Progressing Goal: STG: Bekah WILL PARTICIPATE IN AT LEAST 80% OF SCHEDULED INDIVIDUAL PSYCHOTHERAPY SESSIONS Outcome: Progressing Intervention: Encourage verbalization of feelings/concerns/expectations Note: Allowed pt to explore/express Intervention: Encourage self-care activities Note: Reviewed

## 2022-04-12 NOTE — Progress Notes (Unsigned)
[facility] MD/PA/NP OP Progress Note  04/14/2022 4:08 PM Martha [name]  MRN:  [id number]  Chief Complaint:  Chief Complaint  Patient presents with   Follow-up   HPI:  - she had thyroid US in Oct. "Markedly heterogeneous but normal sized thyroid without discrete nodule or mass, nonspecific though compatible with history of Hashimoto's thyroiditis."  This is a follow-up appointment for depression and anxiety.  She states that she had COVID last week.  She returned to work yesterday.  There has been shortage of staff, and it has been busy.  She has been able to handle things well most of the time.  She had another thyroid US as images were lost at [facility].  They did not find any nodules or masses.  Although she feels better, she is unsure as to who to believe.  She has an upcoming appointment with ENT next Jan. She is looking forward to cooking a giant pie. Her family will be gathering. The patient has mood symptoms as in PHQ-9/GAD-7. She denies SI.  She thinks higher dose of quetiapine has been helpful; she feels less anxious.  She has fair concentration.  Although she occasionally feels SI when she feels frustrated, she adamantly denies any plan or intent as she has her family including her mother.  She denies alcohol use or drug use.  She feels comfortable to stay at the current medication regimen.   Wt Readings from Last 3 Encounters:  04/14/22 (!) 365 lb (165.6 kg)  02/10/22 (!) 368 lb (166.9 kg)  12/03/21 (!) 355 lb (161 kg)    Visit Diagnosis:    ICD-10-CM   1. Recurrent major depressive disorder, in partial remission ([place])  F33.41     2. Generalized anxiety disorder  F41.1     3. Insomnia, unspecified type  G47.00     4. Attention deficit hyperactivity disorder (ADHD), unspecified ADHD type  F90.9       Past Psychiatric History: Please see initial evaluation for full details. I have reviewed the history. No updates at this time.     Past Medical History:  Past Medical  History:  Diagnosis Date   ADHD (attention deficit hyperactivity disorder)    no meds taken   Allergy    Anxiety    Depression    Diabetes mellitus    pre-diabetic,told when younger   Family history of breast cancer    12/20 cancer genetic testing letter sent   Headache(784.0)    chronic migraines, decrease since BCP started   History of hiatal hernia    Idiopathic intracranial hypertension    Insomnia    Irregular menstrual cycle    pt states she started BCP,unsure of name to help regulate cycle   Obesity    Vision abnormalities    near sighted,wears glasses    Past Surgical History:  Procedure Laterality Date   CHOLECYSTECTOMY     COLONOSCOPY WITH PROPOFOL N/A 12/03/2021   Procedure: COLONOSCOPY WITH PROPOFOL;  Surgeon: Lesly Rubenstein, MD;  Location: [facility];  Service: Endoscopy;  Laterality: N/A;   ESOPHAGOGASTRODUODENOSCOPY (EGD) WITH PROPOFOL N/A 12/03/2021   Procedure: ESOPHAGOGASTRODUODENOSCOPY (EGD) WITH PROPOFOL;  Surgeon: Lesly Rubenstein, MD;  Location: [facility] END[facility];  Service: Endoscopy;  Laterality: N/A;  DM   TONSILLECTOMY     at [place]  01/2012    Family Psychiatric History: Please see initial evaluation for full details. I have reviewed the history. No updates at this time.  Family History:  Family History  Problem Relation Age of Onset   Thyroid cancer Mother 66   Depression Mother    Hypertension Father    Anxiety disorder Father    Diabetes type II Father    Skin cancer Maternal Grandfather    Alzheimer's disease Maternal Grandfather    Alzheimer's disease Paternal Grandmother    Leukemia Paternal Aunt    Breast cancer Paternal Aunt 1   Hypertension Maternal Grandmother    Diabetes type II Maternal Grandmother     Social History:  Social History   Socioeconomic History   Marital status: Single    Spouse name: Not on file   Number of children: Not on file   Years of education: Not on file   Highest  education level: Not on file  Occupational History   Occupation: Ship broker    Comment: [employer] 12th grade  Tobacco Use   Smoking status: Never   Smokeless tobacco: Never  Vaping Use   Vaping Use: Former   Quit date: 10/30/2018  Substance and Sexual Activity   Alcohol use: Yes    Comment: occ   Drug use: No   Sexual activity: Not Currently    Partners: Male    Birth control/protection: Pill  Other Topics Concern   Not on file  Social History Narrative   Not on file   Social Determinants of Health   Financial Resource Strain: Not on file  Food Insecurity: Not on file  Transportation Needs: Not on file  Physical Activity: Inactive (04/07/2017)   Exercise Vital Sign    Days of Exercise per Week: 0 days    Minutes of Exercise per Session: 0 min  Stress: Stress Concern Present (04/07/2017)   [place]    Feeling of Stress : To some extent  Social Connections: Somewhat Isolated (04/07/2017)   Social Connection and Isolation Panel [NHANES]    Frequency of Communication with Friends and Family: Once a week    Frequency of Social Gatherings with Friends and Family: More than three times a week    Attends Religious Services: Never    Marine scientist or Organizations: Yes    Attends Music therapist: More than 4 times per year    Marital Status: Never married    Allergies:  Allergies  Allergen Reactions   Lamisil [Terbinafine]     Metabolic Disorder Labs: Lab Results  Component Value Date   HGBA1C 6.5 (H) 04/19/2021   No results found for: "PROLACTIN" Lab Results  Component Value Date   CHOL 151 04/20/2020   TRIG 136 04/20/2020   HDL 40 04/20/2020   CHOLHDL 3.8 04/20/2020   LDLCALC 87 04/20/2020   Lab Results  Component Value Date   TSH 6.670 (H) 04/20/2020   TSH 2.405 02/23/2015    Therapeutic Level Labs: No results found for: "LITHIUM" No results found for:  "VALPROATE" No results found for: "CBMZ"  Current Medications: Current Outpatient Medications  Medication Sig Dispense Refill   amoxicillin (AMOXIL) 250 MG capsule Take 250 mg by mouth 3 (three) times daily.     ascorbic acid (VITAMIN C) 1000 MG tablet Take 1,000 mg by mouth daily.     calcium carbonate (OSCAL) 1500 (600 Ca) MG TABS tablet Take 1,500 mg by mouth 2 (two) times daily with a meal.     cetirizine (ZYRTEC) 10 MG tablet Take 10 mg by mouth daily.     Cholecalciferol (VITAMIN  D-1000 MAX ST) 25 MCG (1000 UT) tablet Take 1,000 Units by mouth daily.     colestipol (COLESTID) 1 g tablet Take by mouth.     hydrochlorothiazide (MICROZIDE) 12.5 MG capsule Take 12.5 mg by mouth daily.     levonorgestrel-ethinyl estradiol (INTROVALE) 0.15-0.03 MG tablet Take 1 tablet by mouth daily. 91 tablet 4   levothyroxine (SYNTHROID) 50 MCG tablet Take 50 mcg by mouth daily before breakfast.     LORazepam (ATIVAN) 1 MG tablet Take by mouth.     magnesium oxide (MAG-OX) 400 MG tablet Take by mouth.     meloxicam (MOBIC) 7.5 MG tablet Take up to 1-2 tablets daily by mouth as needed for pain.     metFORMIN (GLUCOPHAGE) 500 MG tablet Take 250 mg by mouth daily.     methylphenidate (RITALIN) 10 MG tablet Take 1 tablet (10 mg total) by mouth daily before breakfast. 30 tablet 0   methylphenidate (RITALIN) 10 MG tablet Take 1 tablet (10 mg total) by mouth daily. 30 tablet 0   Multiple Vitamin (MULTI-VITAMIN) tablet Take by mouth.     Na Sulfate-K Sulfate-Mg Sulf 17.5-3.13-1.6 GM/177ML SOLN Take by mouth.     omeprazole (PRILOSEC OTC) 20 MG tablet Take 20 mg by mouth in the morning and at bedtime.     predniSONE (DELTASONE) 10 MG tablet Take 10 mg by mouth daily with breakfast.     promethazine (PHENERGAN) 12.5 MG tablet Take 12.5 mg by mouth every 6 (six) hours as needed for nausea or vomiting.     spironolactone (ALDACTONE) 100 MG tablet TK 1 T PO QD B NOON  9   vitamin B-12 (CYANOCOBALAMIN) 1000 MCG tablet  Take 1,000 mcg by mouth daily.     [START ON 05/12/2022] busPIRone (BUSPAR) 30 MG tablet Take 1 tablet (30 mg total) by mouth at bedtime. 90 tablet 1   [START ON 05/05/2022] desvenlafaxine (PRISTIQ) 100 MG 24 hr tablet Take 1 tablet (100 mg total) by mouth daily. 90 tablet 1   [START ON 05/12/2022] QUEtiapine (SEROQUEL) 50 MG tablet Take 1 tablet (50 mg total) by mouth at bedtime. 90 tablet 0   [START ON 05/24/2022] zaleplon (SONATA) 10 MG capsule Take 2 capsules (20 mg total) by mouth at bedtime. 60 capsule 0   No current facility-administered medications for this visit.     Musculoskeletal: Strength & Muscle Tone: within normal limits Gait & Station: normal Patient leans: N/A  Psychiatric Specialty Exam: Review of Systems  Psychiatric/Behavioral:  Positive for dysphoric mood. Negative for agitation, behavioral problems, confusion, decreased concentration, hallucinations, self-injury, sleep disturbance and suicidal ideas. The patient is nervous/anxious. The patient is not hyperactive.   All other systems reviewed and are negative.   Blood pressure (!) 145/86, pulse 99, temperature 97.8 F (36.6 C), temperature source Oral, height 5\' 8"  (1.727 m), weight (!) 365 lb (165.6 kg).Body mass index is 55.5 kg/m.  General Appearance: Fairly Groomed  Eye Contact:  Good  Speech:  Clear and Coherent  Volume:  Normal  Mood:   good  Affect:  Appropriate, Congruent, and calm  Thought Process:  Coherent  Orientation:  Full (Time, Place, and Person)  Thought Content: Logical   Suicidal Thoughts:  No  Homicidal Thoughts:  No  Memory:  Immediate;   Good  Judgement:  Good  Insight:  Good  Psychomotor Activity:  Normal  Concentration:  Concentration: Good and Attention Span: Good  Recall:  Good  Fund of Knowledge: Good  Language: Good  Akathisia:  No  Handed:  Right  AIMS (if indicated): not done  Assets:  Communication Skills Desire for Improvement  ADL's:  Intact  Cognition: WNL  Sleep:   Fair   Screenings: GAD-7    Flowsheet Row Office Visit from 04/14/2022 in [facility] Office Visit from 02/10/2022 in [facility] Office Visit from 04/19/2021 in [facility] Office Visit from 04/16/2020 in [facility] Office Visit from 04/15/2019 in [facility]  Total GAD-7 Score 7 9 7 7 10       PHQ2-9    Flowsheet Row Office Visit from 04/14/2022 in [facility] Office Visit from 02/10/2022 in [facility] Office Visit from 12/07/2021 in [facility] Office Visit from 10/07/2021 in [facility] Counselor from 06/21/2021 in [facility]  PHQ-2 Total Score 2 2 3 2 1   PHQ-9 Total Score 10 14 11 12  --      Flowsheet Row Office Visit from 04/14/2022 in [facility] Counselor from 04/06/2022 in [facility] [place] Counselor from 02/22/2022 in [facility] [place]  C-SSRS RISK CATEGORY Error: Q3, 4, or 5 should not be populated when Q2 is No Moderate Risk Error: Q3, 4, or 5 should not be populated when Q2 is No        Assessment and Plan:  Martha [name] is a 26 y.o. year old female with a history of depression, anxiety, ADHD by history, migraine, GERD, who presents for follow up appointment for below.    1. Recurrent major depressive disorder, in partial remission (HCC) 2. Generalized anxiety disorder There has been overall improvement in depressive symptoms and anxiety, SI since uptitration of quetiapine.  Psychosocial stressors includes  loss of her friends, family including her aunt, financial strain, conflict with her father, medical condition of her grandmother with Alzheimer disease.  Will continue Pristiq, quetiapine to target depression and anxiety.  Will continue BuSpar to target  anxiety.   3. Insomnia, unspecified type Improving.  According to the chart review, she was diagnosed with obstructive sleep apnea, and PAP titration was recommended in 2021. She will have evaluation of sleep apnea.  Will continue zaleplon as needed for insomnia at this time.   4. Attention deficit hyperactivity disorder (ADHD), unspecified ADHD type Neuropsych testing was consistent with ADHD.  Will continue current dose of Ritalin to target ADHD.     Plan  Continue Pristiq 100 mg daily  Continue quetiapine 50 mg at night (on metformin) Continue buspirone 30 mg at night Continue Zaleplon 20 mg at night as needed for sleep Continue Ritalin 10 mg daily  Next appointment: 1/25 at 4 PM for 30 mins, in person - she sees Ms. Hussami for therapy Emergency resources which includes 911, ED, suicide crisis line [phone]) are discussed.     Past trials of medication: sertraline, Pristiq, bupropion (some adverse reaction in the past), Abilify (visual change)     I have reviewed suicide assessment in detail. No change in the following assessment.    The patient demonstrates the following risk factors for suicide: Chronic risk factors for suicide include: psychiatric disorder of depression, anxiety. Acute risk factors for suicide include: family or marital conflict. Protective factors for this patient include: positive social support, coping skills and hope for the future. Considering these factors, the overall suicide risk at this point appears to be low. Patient is appropriate for outpatient follow up.  This clinician has discussed the side effect associated with medication prescribed during this encounter. Please refer to notes in the previous encounters for more details.   I have utilized the [place] Controlled Substances Reporting System (PMP AWARxE) to confirm adherence regarding the patient's medication. My review reveals appropriate prescription fills.     Collaboration of Care: Collaboration  of Care: Other reviewed notes in Epic  Patient/Guardian was advised Release of Information must be obtained prior to any record release in order to collaborate their care with an outside provider. Patient/Guardian was advised if they have not already done so to contact the registration department to sign all necessary forms in order for [place] to release information regarding their care.   Consent: Patient/Guardian gives verbal consent for treatment and assignment of benefits for services provided during this visit. Patient/Guardian expressed understanding and agreed to proceed.    Norman Clay, MD 04/14/2022, 4:08 PM

## 2022-04-14 ENCOUNTER — Ambulatory Visit: Payer: 59 | Admitting: Psychiatry

## 2022-04-14 ENCOUNTER — Encounter: Payer: Self-pay | Admitting: Psychiatry

## 2022-04-14 VITALS — BP 145/86 | HR 99 | Temp 97.8°F | Ht 68.0 in | Wt 365.0 lb

## 2022-04-14 DIAGNOSIS — F3341 Major depressive disorder, recurrent, in partial remission: Secondary | ICD-10-CM

## 2022-04-14 DIAGNOSIS — G4700 Insomnia, unspecified: Secondary | ICD-10-CM | POA: Diagnosis not present

## 2022-04-14 DIAGNOSIS — F909 Attention-deficit hyperactivity disorder, unspecified type: Secondary | ICD-10-CM

## 2022-04-14 DIAGNOSIS — F411 Generalized anxiety disorder: Secondary | ICD-10-CM | POA: Diagnosis not present

## 2022-04-14 MED ORDER — DESVENLAFAXINE SUCCINATE ER 100 MG PO TB24: 100.0000 mg | ORAL_TABLET | Freq: Every day | ORAL | 1 refills | Status: DC

## 2022-04-14 MED ORDER — ZALEPLON 10 MG PO CAPS: 20.0000 mg | ORAL_CAPSULE | Freq: Every day | ORAL | 0 refills | Status: DC

## 2022-04-14 MED ORDER — METHYLPHENIDATE HCL 10 MG PO TABS: 10.0000 mg | ORAL_TABLET | Freq: Every day | ORAL | 0 refills | Status: DC

## 2022-04-14 MED ORDER — BUSPIRONE HCL 30 MG PO TABS: 30.0000 mg | ORAL_TABLET | Freq: Every day | ORAL | 1 refills | Status: DC

## 2022-04-14 MED ORDER — QUETIAPINE FUMARATE 50 MG PO TABS: 50.0000 mg | ORAL_TABLET | Freq: Every day | ORAL | 0 refills | Status: DC

## 2022-04-14 NOTE — Patient Instructions (Signed)
Continue Pristiq 100 mg daily  Continue quetiapine 50 mg at night  Continue buspirone 30 mg at night Continue Zaleplon 20 mg at night as needed for sleep Continue Ritalin 10 mg daily  Next appointment: 1/25 at 4 PM

## 2022-04-29 ENCOUNTER — Inpatient Hospital Stay: Payer: Managed Care, Other (non HMO) | Attending: Oncology

## 2022-04-29 ENCOUNTER — Inpatient Hospital Stay: Payer: Managed Care, Other (non HMO) | Admitting: Oncology

## 2022-06-06 ENCOUNTER — Telehealth: Payer: Self-pay | Admitting: Psychiatry

## 2022-06-06 ENCOUNTER — Other Ambulatory Visit: Payer: Self-pay | Admitting: Psychiatry

## 2022-06-06 MED ORDER — ZALEPLON 10 MG PO CAPS: 20.0000 mg | ORAL_CAPSULE | Freq: Every day | ORAL | 0 refills | Status: DC

## 2022-06-06 NOTE — Telephone Encounter (Signed)
Patient stopped by stating that she needs a refill on zaleplon. Please send to [facility] in [facility]

## 2022-06-06 NOTE — Telephone Encounter (Signed)
Ordered

## 2022-06-07 NOTE — Telephone Encounter (Signed)
pt was left a message that rx was sent.

## 2022-06-13 ENCOUNTER — Ambulatory Visit (INDEPENDENT_AMBULATORY_CARE_PROVIDER_SITE_OTHER): Payer: 59 | Admitting: Licensed Clinical Social Worker

## 2022-06-13 DIAGNOSIS — F331 Major depressive disorder, recurrent, moderate: Secondary | ICD-10-CM

## 2022-06-13 DIAGNOSIS — F411 Generalized anxiety disorder: Secondary | ICD-10-CM

## 2022-06-13 NOTE — Progress Notes (Signed)
Virtual Visit via Video Note  I connected with Martha [name] on 06/13/22 at  4:00 PM EST by a video enabled telemedicine application and verified that I am speaking with the correct person using two identifiers.  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 The patient expressed understanding and agreed to proceed.   I discussed the assessment and treatment plan with the patient. The patient was provided an opportunity to ask questions and all were answered. The patient agreed with the plan and demonstrated an understanding of the instructions.   The patient was advised to call back or seek an in-person evaluation if the symptoms worsen or if the condition fails to improve as anticipated.  I provided 35 minutes of non-face-to-face time during this encounter.   Martha [name] R Martha Cabezas, LCSW   THERAPIST PROGRESS NOTE  Session Time: 52-435p  Participation Level: None  Behavioral Response: Neat and Well GroomedAlertAnxious  Type of Therapy: Individual Therapy  Treatment Goals addressed:    Problem: Decrease depressive symptoms and improve levels of effective functioning Goal: LTG: Reduce frequency, intensity, and duration of depression symptoms as evidenced by: SSB input needed on appropriate metric Outcome: Progressing Goal: STG: Martha [name] WILL PARTICIPATE IN AT LEAST 80% OF SCHEDULED INDIVIDUAL PSYCHOTHERAPY SESSIONS Outcome: Progressing Intervention: Encourage verbalization of feelings/concerns/expectations Note: Allowed pt to explore/express Intervention: Encourage self-care activities Note: Reviewed    ProgressTowards Goals: Progressing  Interventions: CBT  Summary: Martha [name] is a 27 y.o. female who presents with improving symptoms related to depression and anxiety. Pt reports that overall mood has been stable and that she is  managing stress and anxiety well.   Allowed pt to explore and express thoughts and feelings associated with recent life situations and external stressors.Patient reports that she is continuing to experience anxiety and stress associated with health related concerns. Patient reports that she is continuing to have migraine headaches, and is missing work today because of a migraine headache. Patient reports that she's currently undergoing medical assessment to determine cause of the headaches and to determine treatment. Patient reports that she is compliant with all medical recommendations, testing, and follow-ups.  Patient reports that her mother has to have back surgery, so patient has some anxiety associated with this. 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 Allowed patient to identify other anxiety triggers regarding the health and well-being of her parents. Review the importance of self-care, mindfulness, and positive social engagement. Patient reflects understanding and cooperation.  Continued recommendations are as follows: self care behaviors, positive social engagements, focusing on overall work/home/life balance, and focusing on positive physical and emotional wellness.   Suicidal/Homicidal: No  Therapist Response: Pt is continuing to apply interventions learned in session into daily life situations. Pt is currently on track to meet goals utilizing interventions mentioned above. Personal growth and progress noted. Treatment to continue as indicated.   Plan: Return again in 4 weeks.  Diagnosis:  Encounter Diagnoses  Name Primary?   Moderate episode of recurrent major depressive disorder (HCC) Yes   Generalized anxiety disorder    Collaboration of Care: Other pt encouraged to continue psychiatric services with current provider, Dr. Modesta Messing  Patient/Guardian was advised Release of Information must be obtained prior to any record release in order to  collaborate their care with an outside provider. Patient/Guardian was advised if they have not already done so  to contact the registration department to sign all necessary forms in order for [place] to release information regarding their care.   Consent: Patient/Guardian gives verbal consent for treatment and assignment of benefits for services provided during this visit. Patient/Guardian expressed understanding and agreed to proceed.   [place], LCSW 06/13/2022

## 2022-06-22 NOTE — Progress Notes (Unsigned)
BH MD/PA/NP OP Progress Note  06/23/2022 5:10 PM Martha [name]  MRN:  [id number]  Chief Complaint:  Chief Complaint  Patient presents with   Follow-up   HPI:  This is a follow-up appointment for depression and insomnia.  She states that she has been very busy lately, although she is able to keep up with it. She has some adrenaline when she went back home.  She had a good Christmas.  She is concerned about her mother, who might need the surgery.  She had a spinal fusion, and she was found that screw got loose.  She likely needs to take days off from work.  She does not think her father would help.  She would not say anything as he would give her "silent treatment." He was drinking alcohol when she had to bring her mother to ED several years ago.  She has occasional passive SI when there is some other factor, although she denies any active plan or intent. The patient has mood symptoms as in PHQ-9/GAD-7.  She enjoys going to nail every two weeks, and enjoyed the game with  her friend the other night.  She reports decrease in appetite.   Sleep- She finds zaleplon to be very helpful.  She states that she has issues with sleep since age 13.  She found out that the evaluation her sleep specialist is trying to do is not covered by her insurance. She is trying to figure out whether it can be done at [facility].    Wt Readings from Last 3 Encounters:  06/23/22 (!) 362 lb 12.8 oz (164.6 kg)  04/14/22 (!) 365 lb (165.6 kg)  02/10/22 (!) 368 lb (166.9 kg)    Visit Diagnosis:    ICD-10-CM   1. Moderate episode of recurrent major depressive disorder (HCC)  F33.1     2. Generalized anxiety disorder  F41.1     3. Insomnia, unspecified type  G47.00     4. Attention deficit hyperactivity disorder (ADHD), unspecified ADHD type  F90.9       Past Psychiatric History: Please see initial evaluation for full details. I have reviewed the history. No updates at this time.     Past Medical History:  Past  Medical History:  Diagnosis Date   ADHD (attention deficit hyperactivity disorder)    no meds taken   Allergy    Anxiety    Depression    Diabetes mellitus    pre-diabetic,told when younger   Family history of breast cancer    12/20 cancer genetic testing letter sent   Headache(784.0)    chronic migraines, decrease since BCP started   History of hiatal hernia    Idiopathic intracranial hypertension    Insomnia    Irregular menstrual cycle    pt states she started BCP,unsure of name to help regulate cycle   Obesity    Vision abnormalities    near sighted,wears glasses    Past Surgical History:  Procedure Laterality Date   CHOLECYSTECTOMY     COLONOSCOPY WITH PROPOFOL N/A 12/03/2021   Procedure: COLONOSCOPY WITH PROPOFOL;  Surgeon: Regis Bill, MD;  Location: [facility] ENDOSCOPY;  Service: Endoscopy;  Laterality: N/A;   ESOPHAGOGASTRODUODENOSCOPY (EGD) WITH PROPOFOL N/A 12/03/2021   Procedure: ESOPHAGOGASTRODUODENOSCOPY (EGD) WITH PROPOFOL;  Surgeon: Regis Bill, MD;  Location: [facility] ENDOSCOPY;  Service: Endoscopy;  Laterality: N/A;  DM   TONSILLECTOMY     at 27yo   UPPER GI ENDOSCOPY  01/2012    Family  Psychiatric History: Please see initial evaluation for full details. I have reviewed the history. No updates at this time.     Family History:  Family History  Problem Relation Age of Onset   Thyroid cancer Mother 28   Depression Mother    Hypertension Father    Anxiety disorder Father    Diabetes type II Father    Skin cancer Maternal Grandfather    Alzheimer's disease Maternal Grandfather    Alzheimer's disease Paternal Grandmother    Leukemia Paternal Aunt    Breast cancer Paternal Aunt 40   Hypertension Maternal Grandmother    Diabetes type II Maternal Grandmother     Social History:  Social History   Socioeconomic History   Marital status: Single    Spouse name: Not on file   Number of children: Not on file   Years of education: Not on file    Highest education level: Not on file  Occupational History   Occupation: Consulting civil engineer    Comment: [employer] 12th grade  Tobacco Use   Smoking status: Never   Smokeless tobacco: Never  Vaping Use   Vaping Use: Former   Quit date: 10/30/2018  Substance and Sexual Activity   Alcohol use: Yes    Comment: occ   Drug use: No   Sexual activity: Not Currently    Partners: Male    Birth control/protection: Pill  Other Topics Concern   Not on file  Social History Narrative   Not on file   Social Determinants of Health   Financial Resource Strain: Not on file  Food Insecurity: Not on file  Transportation Needs: Not on file  Physical Activity: Inactive (04/07/2017)   Exercise Vital Sign    Days of Exercise per Week: 0 days    Minutes of Exercise per Session: 0 min  Stress: Stress Concern Present (04/07/2017)   [employer] of Occupational Health - Occupational Stress Questionnaire    Feeling of Stress : To some extent  Social Connections: Somewhat Isolated (04/07/2017)   Social Connection and Isolation Panel [NHANES]    Frequency of Communication with Friends and Family: Once a week    Frequency of Social Gatherings with Friends and Family: More than three times a week    Attends Religious Services: Never    Database administrator or Organizations: Yes    Attends Engineer, structural: More than 4 times per year    Marital Status: Never married    Allergies:  Allergies  Allergen Reactions   Doxycycline Other (See Comments)   Lamisil [Terbinafine]     Metabolic Disorder Labs: Lab Results  Component Value Date   HGBA1C 6.5 (H) 04/19/2021   No results found for: "PROLACTIN" Lab Results  Component Value Date   CHOL 151 04/20/2020   TRIG 136 04/20/2020   HDL 40 04/20/2020   CHOLHDL 3.8 04/20/2020   LDLCALC 87 04/20/2020   Lab Results  Component Value Date   TSH 6.670 (H) 04/20/2020   TSH 2.405 02/23/2015    Therapeutic Level Labs: No results found  for: "LITHIUM" No results found for: "VALPROATE" No results found for: "CBMZ"  Current Medications: Current Outpatient Medications  Medication Sig Dispense Refill   amoxicillin (AMOXIL) 250 MG capsule Take 250 mg by mouth 3 (three) times daily.     ascorbic acid (VITAMIN C) 1000 MG tablet Take 1,000 mg by mouth daily.     busPIRone (BUSPAR) 30 MG tablet Take 1 tablet (30 mg total) by mouth  at bedtime. 90 tablet 1   calcium carbonate (OSCAL) 1500 (600 Ca) MG TABS tablet Take 1,500 mg by mouth 2 (two) times daily with a meal.     cetirizine (ZYRTEC) 10 MG tablet Take 10 mg by mouth daily.     Cholecalciferol (VITAMIN D-1000 MAX ST) 25 MCG (1000 UT) tablet Take 1,000 Units by mouth daily.     colestipol (COLESTID) 1 g tablet Take by mouth.     desvenlafaxine (PRISTIQ) 100 MG 24 hr tablet Take 1 tablet (100 mg total) by mouth daily. 90 tablet 1   hydrochlorothiazide (MICROZIDE) 12.5 MG capsule Take 12.5 mg by mouth daily.     levonorgestrel-ethinyl estradiol (INTROVALE) 0.15-0.03 MG tablet Take 1 tablet by mouth daily. 91 tablet 4   levothyroxine (SYNTHROID) 50 MCG tablet Take 50 mcg by mouth daily before breakfast.     LORazepam (ATIVAN) 1 MG tablet Take by mouth.     magnesium oxide (MAG-OX) 400 MG tablet Take by mouth.     meloxicam (MOBIC) 7.5 MG tablet Take up to 1-2 tablets daily by mouth as needed for pain.     metFORMIN (GLUCOPHAGE) 500 MG tablet Take 250 mg by mouth daily.     methylphenidate (RITALIN) 10 MG tablet Take 1 tablet (10 mg total) by mouth daily before breakfast. 30 tablet 0   [START ON 07/23/2022] methylphenidate (RITALIN) 10 MG tablet Take 1 tablet (10 mg total) by mouth daily before breakfast. 30 tablet 0   [START ON 08/22/2022] methylphenidate (RITALIN) 10 MG tablet Take 1 tablet (10 mg total) by mouth daily before breakfast. 30 tablet 0   Multiple Vitamin (MULTI-VITAMIN) tablet Take by mouth.     Na Sulfate-K Sulfate-Mg Sulf 17.5-3.13-1.6 GM/177ML SOLN Take by mouth.      omeprazole (PRILOSEC OTC) 20 MG tablet Take 20 mg by mouth in the morning and at bedtime.     predniSONE (DELTASONE) 10 MG tablet Take 10 mg by mouth daily with breakfast.     promethazine (PHENERGAN) 12.5 MG tablet Take 12.5 mg by mouth every 6 (six) hours as needed for nausea or vomiting.     QUEtiapine (SEROQUEL) 50 MG tablet Take 1 tablet (50 mg total) by mouth at bedtime. 90 tablet 0   spironolactone (ALDACTONE) 100 MG tablet TK 1 T PO QD B NOON  9   vitamin B-12 (CYANOCOBALAMIN) 1000 MCG tablet Take 1,000 mcg by mouth daily.     [START ON 07/06/2022] zaleplon (SONATA) 10 MG capsule Take 2 capsules (20 mg total) by mouth at bedtime. 60 capsule 1   No current facility-administered medications for this visit.     Musculoskeletal: Strength & Muscle Tone: within normal limits Gait & Station: normal Patient leans: N/A  Psychiatric Specialty Exam: Review of Systems  Psychiatric/Behavioral:  Positive for dysphoric mood and sleep disturbance. Negative for agitation, behavioral problems, confusion, decreased concentration, hallucinations, self-injury and suicidal ideas. The patient is nervous/anxious. The patient is not hyperactive.   All other systems reviewed and are negative.   Blood pressure 139/84, pulse (!) 101, temperature 98.7 F (37.1 C), temperature source Skin, height 5\' 8"  (1.727 m), weight (!) 362 lb 12.8 oz (164.6 kg).Body mass index is 55.16 kg/m.  General Appearance: Fairly Groomed  Eye Contact:  Good  Speech:  Clear and Coherent  Volume:  Normal  Mood:   stressed  Affect:  Appropriate, Congruent, and calm  Thought Process:  Coherent  Orientation:  Full (Time, Place, and Person)  Thought Content: Logical  Suicidal Thoughts:  No  Homicidal Thoughts:  No  Memory:  Immediate;   Good  Judgement:  Good  Insight:  Good  Psychomotor Activity:  Normal, Normal tone, no rigidity, no resting/postural tremors, no tardive dyskinesia    Concentration:  Concentration: Good and  Attention Span: Good  Recall:  Good  Fund of Knowledge: Good  Language: Good  Akathisia:  No  Handed:  Right  AIMS (if indicated): not done  Assets:  Communication Skills Desire for Improvement  ADL's:  Intact  Cognition: WNL  Sleep:  Fair   Screenings: GAD-7    Flowsheet Row Office Visit from 06/23/2022 in [facility] Office Visit from 04/14/2022 in [facility] Office Visit from 02/10/2022 in [facility] Office Visit from 04/19/2021 in [facility] Office Visit from 04/16/2020 in [facility]  Total GAD-7 Score 7 7 9 7 7       PHQ2-9    [place] Visit from 06/23/2022 in [facility] Office Visit from 04/14/2022 in [facility] Office Visit from 02/10/2022 in [facility] Office Visit from 12/07/2021 in [facility] Office Visit from 10/07/2021 in [facility]  PHQ-2 Total Score 3 2 2 3 2   PHQ-9 Total Score 11 10 14 11 12       Flowsheet Row Counselor from 06/13/2022 in [facility] [place] from 04/14/2022 in [facility] Counselor from 04/06/2022 in [facility] [place] Error: Q3, 4, or 5 should not be populated when Q2 is No Error: Q3, 4, or 5 should not be populated when Q2 is No Moderate Risk        Assessment and Plan:  Martha [name] is a 27 y.o. year old female with a history of depression, anxiety, ADHD by history, migraine, GERD, who presents for follow up appointment for below.    1. Moderate episode of recurrent major depressive disorder ([place]) 2. Generalized anxiety disorder Acute  stressors include: her mother, who may need to have back surgery  Other stressors include:father with emotional abuse, loss of maternal grandmother from [place], paternal grandmother with alzheimer, who is in nursing facility     History: admitted once in 2013 for depression   She reports depressive symptoms and anxiety, and occasional fleeting passive SI since the last visit due to stressors as above, although it has been overall improving since uptitration of quetiapine.  Will continue current medication regimen at this time.  Will continue Pristiq, quetiapine for depression, and BuSpar for anxiety.   3. Insomnia, unspecified type - reportedly struggles with insomnia since age 63. Diagnosed with OSA, and PAP titration was recommended in 2021 [place].  She reports good benefit from zaleplon.  She is actively trying to get sleep evaluation.  Will continue current dose of zaleplon at this time to target insomnia.   4. Attention deficit hyperactivity disorder (ADHD), unspecified ADHD type - neuropsych testing was consistent with ADHD She reports good benefit from the current regimen.  Will continue current dose of Ritalin to target ADHD.     Plan Continue Pristiq 100 mg daily  Continue quetiapine 50 mg at night (on metformin) Continue buspirone 30 mg at night Continue Zaleplon 20 mg at night as needed for sleep Continue Ritalin  10 mg daily  Next appointment: 3/26 at 4:30 for 30 mins, in person Consider light therapy   - she sees Ms. Hussami for therapy Emergency resources which includes 911, ED, suicide crisis line [phone]) are discussed.     Past trials of medication: sertraline, Pristiq, bupropion (some adverse reaction in the past), Abilify (visual change)     I have reviewed suicide assessment in detail. No change in the following assessment.    The patient demonstrates the following risk factors for suicide: Chronic risk factors for suicide include: psychiatric disorder of  depression, anxiety. Acute risk factors for suicide include: family or marital conflict. Protective factors for this patient include: positive social support, coping skills and hope for the future. Considering these factors, the overall suicide risk at this point appears to be low. Patient is appropriate for outpatient follow up.       Collaboration of Care: Collaboration of Care: Other reviewed notes in Epic  Patient/Guardian was advised Release of Information must be obtained prior to any record release in order to collaborate their care with an outside provider. Patient/Guardian was advised if they have not already done so to contact the registration department to sign all necessary forms in order for [place] to release information regarding their care.   Consent: Patient/Guardian gives verbal consent for treatment and assignment of benefits for services provided during this visit. Patient/Guardian expressed understanding and agreed to proceed.    Norman Clay, MD 06/23/2022, 5:10 PM

## 2022-06-23 ENCOUNTER — Ambulatory Visit (INDEPENDENT_AMBULATORY_CARE_PROVIDER_SITE_OTHER): Payer: 59 | Admitting: Psychiatry

## 2022-06-23 ENCOUNTER — Encounter: Payer: Self-pay | Admitting: Psychiatry

## 2022-06-23 VITALS — BP 139/84 | HR 101 | Temp 98.7°F | Ht 68.0 in | Wt 362.8 lb

## 2022-06-23 DIAGNOSIS — G4700 Insomnia, unspecified: Secondary | ICD-10-CM

## 2022-06-23 DIAGNOSIS — F331 Major depressive disorder, recurrent, moderate: Secondary | ICD-10-CM | POA: Diagnosis not present

## 2022-06-23 DIAGNOSIS — F909 Attention-deficit hyperactivity disorder, unspecified type: Secondary | ICD-10-CM

## 2022-06-23 DIAGNOSIS — F411 Generalized anxiety disorder: Secondary | ICD-10-CM | POA: Diagnosis not present

## 2022-06-23 MED ORDER — ZALEPLON 10 MG PO CAPS: 20.0000 mg | ORAL_CAPSULE | Freq: Every day | ORAL | 1 refills | Status: DC

## 2022-06-23 MED ORDER — METHYLPHENIDATE HCL 10 MG PO TABS: 10.0000 mg | ORAL_TABLET | Freq: Every day | ORAL | 0 refills | Status: DC

## 2022-06-23 NOTE — Patient Instructions (Addendum)
Continue Pristiq 100 mg daily  Continue quetiapine 50 mg at night  Continue buspirone 30 mg at night Continue Zaleplon 20 mg at night as needed for sleep Continue Ritalin 10 mg daily  Next appointment: 3/26 at 4:30 Consider light therapy - see recommendation as below  Generally, the light box should: Emit full-spectrum light with either fluorescent or LED bulbs (fluorescent is usually recommended) Provide an exposure to 10,000 lux of light Produce as little UV light as possible  Typical recommendations include using the light box: Within the 30 mins to first hour of waking up in the morning For about 20 to 30 minutes About 16 to 24 inches (41 to 61 centimeters) from your face, but follow the manufacturer's instructions about distance With eyes open, but not looking directly at the light  Noted that Light boxes aren't regulated by the Food and Drug Administration (FDA) for SAD (seasonal affective disorder) treatment.

## 2022-07-25 ENCOUNTER — Ambulatory Visit: Payer: Managed Care, Other (non HMO) | Attending: Otolaryngology

## 2022-07-25 DIAGNOSIS — G4733 Obstructive sleep apnea (adult) (pediatric): Secondary | ICD-10-CM | POA: Insufficient documentation

## 2022-08-01 ENCOUNTER — Telehealth: Payer: Self-pay | Admitting: Psychiatry

## 2022-08-01 NOTE — Telephone Encounter (Signed)
Martha [name] please contact pharmacy to verify when she is due , since if she needs a script we can send one.looks like she will be out but only in 2weeks . Needs to clarify with pharmacy . Please let me know.

## 2022-08-01 NOTE — Telephone Encounter (Signed)
QUEtiapine (SEROQUEL) 50 MG tablet 90 tablet 0 05/12/2022 08/10/2022   Sig - Route: Take 1 tablet (50 mg total) by mouth at bedtime. - Oral   Sent to pharmacy as: QUEtiapine (SEROQUEL) 50 MG tablet   Notes to Pharmacy: Fill after 12/7   E-Prescribing Status: Receipt confirmed by pharmacy (04/14/2022  4:00 PM EST)      Per eprescription status patient will be due for Seroquel only in mid March.  Please contact pharmacy to verify and let me know.

## 2022-08-01 NOTE — Telephone Encounter (Signed)
Patient stopped by the office. States Martha [name] in [place] was to contact our office for a refill on her Seroquel. She has not heard anything for it to be filled. Can you send refill? Please advise.

## 2022-08-01 NOTE — Telephone Encounter (Signed)
Made patient aware of medication sent to pharmacy

## 2022-08-02 NOTE — Telephone Encounter (Signed)
Spoke to [place] at patients pharmacy he stated that the last fill was on 06/28/22 30 tablets he ran it and it went through patient will be contacted when the refill is ready for pickup. Made patient aware

## 2022-08-02 NOTE — Telephone Encounter (Signed)
Noted  

## 2022-08-21 NOTE — Progress Notes (Unsigned)
BH MD/PA/NP OP Progress Note  08/23/2022 5:05 PM Martha [name]  MRN:  [id number]  Chief Complaint:  Chief Complaint  Patient presents with   Follow-up   HPI:  - she had CPAP study titration 07/3022.  This is a follow-up appointment for depression, anxiety, ADHD.  She states that she had a breakdown.  She had crying spells.  She was very overwhelmed after staying 12 hours at the hospital for her mother, followed by her work.  She had intense anxiety with hyperventilation.  She does not like it as she cannot think straight.  She had fleeting passive SI when she had this episode.  Her mother had appointment today for follow up for back surgery, and she has been doing well.  She had to go to the hospital yesterday due to UTI.  Loeva may need to have surgery for deviated septum, and sinuses with mucus.  She is worried about coverage at work. The patient has mood symptoms as in PHQ-9/GAD-7. She reports fair concentration. She denies recent SI.  She denies alcohol use or drug use.  She does not think she can sleep without zaleplon, although she verbalized understanding that this medication will be discontinued in the future. She feels comfortable to stay on the current medication regimen.   Wt Readings from Last 3 Encounters:  08/23/22 (!) 355 lb (161 kg)  06/23/22 (!) 362 lb 12.8 oz (164.6 kg)  04/14/22 (!) 365 lb (165.6 kg)     Visit Diagnosis:    ICD-10-CM   1. Mild episode of recurrent major depressive disorder ([place])  F33.0     2. Generalized anxiety disorder  F41.1     3. Insomnia, unspecified type  G47.00     4. Attention deficit hyperactivity disorder (ADHD), unspecified ADHD type  F90.9       Past Psychiatric History: Please see initial evaluation for full details. I have reviewed the history. No updates at this time.     Past Medical History:  Past Medical History:  Diagnosis Date   ADHD (attention deficit hyperactivity disorder)    no meds taken   Allergy    Anxiety     Depression    Diabetes mellitus    pre-diabetic,told when younger   Family history of breast cancer    12/20 cancer genetic testing letter sent   Headache(784.0)    chronic migraines, decrease since BCP started   History of hiatal hernia    Idiopathic intracranial hypertension    Insomnia    Irregular menstrual cycle    pt states she started BCP,unsure of name to help regulate cycle   Obesity    Vision abnormalities    near sighted,wears glasses    Past Surgical History:  Procedure Laterality Date   CHOLECYSTECTOMY     COLONOSCOPY WITH PROPOFOL N/A 12/03/2021   Procedure: COLONOSCOPY WITH PROPOFOL;  Surgeon: Lesly Rubenstein, MD;  Location: [facility] ENDOSCOPY;  Service: Endoscopy;  Laterality: N/A;   ESOPHAGOGASTRODUODENOSCOPY (EGD) WITH PROPOFOL N/A 12/03/2021   Procedure: ESOPHAGOGASTRODUODENOSCOPY (EGD) WITH PROPOFOL;  Surgeon: Lesly Rubenstein, MD;  Location: [facility] ENDOSCOPY;  Service: Endoscopy;  Laterality: N/A;  DM   TONSILLECTOMY     at [facility]  01/2012    Family Psychiatric History: Please see initial evaluation for full details. I have reviewed the history. No updates at this time.     Family History:  Family History  Problem Relation Age of Onset   Thyroid cancer Mother 61  Depression Mother    Hypertension Father    Anxiety disorder Father    Diabetes type II Father    Skin cancer Maternal Grandfather    Alzheimer's disease Maternal Grandfather    Alzheimer's disease Paternal Grandmother    Leukemia Paternal Aunt    Breast cancer Paternal Aunt 34   Hypertension Maternal Grandmother    Diabetes type II Maternal Grandmother     Social History:  Social History   Socioeconomic History   Marital status: Single    Spouse name: Not on file   Number of children: Not on file   Years of education: Not on file   Highest education level: Not on file  Occupational History   Occupation: Ship broker    Comment: [employer] 12th grade  Tobacco  Use   Smoking status: Never   Smokeless tobacco: Never  Vaping Use   Vaping Use: Former   Quit date: 10/30/2018  Substance and Sexual Activity   Alcohol use: Yes    Comment: occ   Drug use: No   Sexual activity: Not Currently    Partners: Male    Birth control/protection: Pill  Other Topics Concern   Not on file  Social History Narrative   Not on file   Social Determinants of Health   Financial Resource Strain: Not on file  Food Insecurity: Not on file  Transportation Needs: Not on file  Physical Activity: Inactive (04/07/2017)   Exercise Vital Sign    Days of Exercise per Week: 0 days    Minutes of Exercise per Session: 0 min  Stress: Stress Concern Present (04/07/2017)   [place]    Feeling of Stress : To some extent  Social Connections: Somewhat Isolated (04/07/2017)   Social Connection and Isolation Panel [NHANES]    Frequency of Communication with Friends and Family: Once a week    Frequency of Social Gatherings with Friends and Family: More than three times a week    Attends Religious Services: Never    Marine scientist or Organizations: Yes    Attends Music therapist: More than 4 times per year    Marital Status: Never married    Allergies:  Allergies  Allergen Reactions   Doxycycline Other (See Comments)   Lamisil [Terbinafine]     Metabolic Disorder Labs: Lab Results  Component Value Date   HGBA1C 6.5 (H) 04/19/2021   No results found for: "PROLACTIN" Lab Results  Component Value Date   CHOL 151 04/20/2020   TRIG 136 04/20/2020   HDL 40 04/20/2020   CHOLHDL 3.8 04/20/2020   LDLCALC 87 04/20/2020   Lab Results  Component Value Date   TSH 6.670 (H) 04/20/2020   TSH 2.405 02/23/2015    Therapeutic Level Labs: No results found for: "LITHIUM" No results found for: "VALPROATE" No results found for: "CBMZ"  Current Medications: Current Outpatient Medications   Medication Sig Dispense Refill   amoxicillin (AMOXIL) 250 MG capsule Take 250 mg by mouth 3 (three) times daily.     ascorbic acid (VITAMIN C) 1000 MG tablet Take 1,000 mg by mouth daily.     busPIRone (BUSPAR) 30 MG tablet Take 1 tablet (30 mg total) by mouth at bedtime. 90 tablet 1   calcium carbonate (OSCAL) 1500 (600 Ca) MG TABS tablet Take 1,500 mg by mouth 2 (two) times daily with a meal.     cetirizine (ZYRTEC) 10 MG tablet Take 10 mg by mouth  daily.     Cholecalciferol (VITAMIN D-1000 MAX ST) 25 MCG (1000 UT) tablet Take 1,000 Units by mouth daily.     colestipol (COLESTID) 1 g tablet Take by mouth.     desvenlafaxine (PRISTIQ) 100 MG 24 hr tablet Take 1 tablet (100 mg total) by mouth daily. 90 tablet 1   hydrochlorothiazide (MICROZIDE) 12.5 MG capsule Take 12.5 mg by mouth daily.     levonorgestrel-ethinyl estradiol (INTROVALE) 0.15-0.03 MG tablet Take 1 tablet by mouth daily. 91 tablet 4   LORazepam (ATIVAN) 1 MG tablet Take by mouth.     magnesium oxide (MAG-OX) 400 MG tablet Take by mouth.     meloxicam (MOBIC) 7.5 MG tablet Take up to 1-2 tablets daily by mouth as needed for pain.     methylphenidate (RITALIN) 10 MG tablet Take 1 tablet (10 mg total) by mouth daily before breakfast. 30 tablet 0   [START ON 09/04/2022] methylphenidate (RITALIN) 10 MG tablet Take 1 tablet (10 mg total) by mouth daily before breakfast. 30 tablet 0   [START ON 10/04/2022] methylphenidate (RITALIN) 10 MG tablet Take 1 tablet (10 mg total) by mouth daily before breakfast. 30 tablet 0   Multiple Vitamin (MULTI-VITAMIN) tablet Take by mouth.     Na Sulfate-K Sulfate-Mg Sulf 17.5-3.13-1.6 GM/177ML SOLN Take by mouth.     omeprazole (PRILOSEC OTC) 20 MG tablet Take 20 mg by mouth in the morning and at bedtime.     predniSONE (DELTASONE) 10 MG tablet Take 10 mg by mouth daily with breakfast.     promethazine (PHENERGAN) 12.5 MG tablet Take 12.5 mg by mouth every 6 (six) hours as needed for nausea or vomiting.      spironolactone (ALDACTONE) 100 MG tablet TK 1 T PO QD B NOON  9   vitamin B-12 (CYANOCOBALAMIN) 1000 MCG tablet Take 1,000 mcg by mouth daily.     levothyroxine (SYNTHROID) 50 MCG tablet Take 50 mcg by mouth daily before breakfast.     metFORMIN (GLUCOPHAGE) 500 MG tablet Take 250 mg by mouth daily.     QUEtiapine (SEROQUEL) 50 MG tablet Take 1 tablet (50 mg total) by mouth at bedtime. 90 tablet 0   [START ON 09/04/2022] zaleplon (SONATA) 10 MG capsule Take 2 capsules (20 mg total) by mouth at bedtime. 60 capsule 1   No current facility-administered medications for this visit.     Musculoskeletal: Strength & Muscle Tone: within normal limits Gait & Station: normal Patient leans: N/A  Psychiatric Specialty Exam: Review of Systems  Psychiatric/Behavioral:  Positive for dysphoric mood and sleep disturbance. Negative for agitation, behavioral problems, confusion, decreased concentration, hallucinations, self-injury and suicidal ideas. The patient is nervous/anxious. The patient is not hyperactive.   All other systems reviewed and are negative.   Blood pressure 136/82, pulse 82, temperature (!) 97.5 F (36.4 C), temperature source Skin, height 5\' 8"  (1.727 m), weight (!) 355 lb (161 kg).Body mass index is 53.98 kg/m.  General Appearance: Fairly Groomed  Eye Contact:  Good  Speech:  Clear and Coherent  Volume:  Normal  Mood:   fine  Affect:  Appropriate, Congruent, and Tearful  Thought Process:  Coherent  Orientation:  Full (Time, Place, and Person)  Thought Content: Logical   Suicidal Thoughts:  No  Homicidal Thoughts:  No  Memory:  Immediate;   Good  Judgement:  Good  Insight:  Good  Psychomotor Activity:  Normal  Concentration:  Concentration: Good and Attention Span: Good  Recall:  Good  Fund of Knowledge: Good  Language: Good  Akathisia:  No  Handed:  Right  AIMS (if indicated): not done  Assets:  Communication Skills Desire for Improvement  ADL's:  Intact  Cognition:  WNL  Sleep:  Fair   Screenings: GAD-7    Flowsheet Row Office Visit from 08/23/2022 in [place] Office Visit from 06/23/2022 in [place] Office Visit from 04/14/2022 in [place] Office Visit from 02/10/2022 in [place] Office Visit from [facility]/19/2021 in [facility]  Total GAD-7 Score 7 7 7 9 7       PHQ2-9    [place] Office Visit from 08/23/2022 in [place] Office Visit from 06/23/2022 in [place] Office Visit from 04/14/2022 in [place] Office Visit from 02/10/2022 in [place] Office Visit from 12/07/2021 in [place]  PHQ-2 Total Score 2 3 2 2 3   PHQ-9 Total Score 10 11 10 14 11       [place] Office Visit from 08/23/2022 in [place] Counselor from 06/13/2022 in [place] at [place] from 04/14/2022 in [place] Error: Q3, 4, or 5 should not be populated when Q2 is No Error: Q3, 4, or 5 should not be populated when Q2 is No Error: Q3, 4, or 5 should not be populated when Q2 is No        Assessment and Plan:  TEMPERANCE KUNDRAT is a 27 y.o. year old female with a history of depression, anxiety, ADHD by history, migraine, GERD, who presents for follow up appointment for below.    1. Mild episode of recurrent major depressive disorder ([place]) 2. Generalized anxiety disorder Acute stressors include: her mother, s/p back surgery, suffering from UTI, own potential surgery for deviated septum Other stressors include:father with emotional abuse, loss  of maternal grandmother from [place], paternal grandmother with alzheimer, who is in nursing facility     History: admitted once in 2013 for depression   Although she had significant episode of anxiety, and a passive SI associated with this episode, her mood symptoms have been overall stable since the last visit/since uptitration of quetiapine.  Will continue Pristiq, quetiapine for depression.  Will continue BuSpar for anxiety.  It is  discussed this with the patient to consider tapering off this medication to avoid polypharmacy if applicable.   3. Insomnia, unspecified type - reportedly struggles with insomnia since age 72. She had CPAP study titration 07/2022. She is in the process of getting CPAP machine.  She is also seen by a provider for possible surgery for deviated septum, which can affect sleep quality.  Will continue current dose of zaleplon as needed for insomnia.  Although she reports preference to stay on this medication, it is discussed with the patient regarding the plan to taper off the medication in the future to avoid risk of tolerance, cognitive issues.   4. Attention deficit hyperactivity disorder (ADHD), unspecified ADHD type - neuropsych testing was consistent with ADHD  She reports good benefit from the current regimen.  Will continue the current dose of Ritalin to target ADHD.      Plan Continue Pristiq 100 mg daily  Continue quetiapine 50 mg at night (on metformin) Continue  buspirone 30 mg at night Continue Zaleplon 20 mg at night as needed for sleep Continue Ritalin 10 mg daily  Next appointment: 5/23 at 4:30 for 30 mins, in person - she sees Ms. Hussami for therapy    Past trials of medication: sertraline, Pristiq, bupropion (some adverse reaction in the past), Abilify (visual change)     I have reviewed suicide assessment in detail. No change in the following assessment.    The patient demonstrates the following risk factors for suicide: Chronic risk factors for  suicide include: psychiatric disorder of depression, anxiety. Acute risk factors for suicide include: family or marital conflict. Protective factors for this patient include: positive social support, coping skills and hope for the future. Considering these factors, the overall suicide risk at this point appears to be low. Patient is appropriate for outpatient follow up.   Collaboration of Care: Collaboration of Care: Other reviewed notes in Epic  Patient/Guardian was advised Release of Information must be obtained prior to any record release in order to collaborate their care with an outside provider. Patient/Guardian was advised if they have not already done so to contact the registration department to sign all necessary forms in order for [place] to release information regarding their care.   Consent: Patient/Guardian gives verbal consent for treatment and assignment of benefits for services provided during this visit. Patient/Guardian expressed understanding and agreed to proceed.    Norman Clay, MD 08/23/2022, 5:05 PM

## 2022-08-23 ENCOUNTER — Ambulatory Visit (INDEPENDENT_AMBULATORY_CARE_PROVIDER_SITE_OTHER): Payer: 59 | Admitting: Psychiatry

## 2022-08-23 ENCOUNTER — Encounter: Payer: Self-pay | Admitting: Psychiatry

## 2022-08-23 VITALS — BP 136/82 | HR 82 | Temp 97.5°F | Ht 68.0 in | Wt 355.0 lb

## 2022-08-23 DIAGNOSIS — F909 Attention-deficit hyperactivity disorder, unspecified type: Secondary | ICD-10-CM | POA: Diagnosis not present

## 2022-08-23 DIAGNOSIS — F330 Major depressive disorder, recurrent, mild: Secondary | ICD-10-CM | POA: Diagnosis not present

## 2022-08-23 DIAGNOSIS — F411 Generalized anxiety disorder: Secondary | ICD-10-CM | POA: Diagnosis not present

## 2022-08-23 DIAGNOSIS — G4700 Insomnia, unspecified: Secondary | ICD-10-CM | POA: Diagnosis not present

## 2022-08-23 MED ORDER — METHYLPHENIDATE HCL 10 MG PO TABS: 10.0000 mg | ORAL_TABLET | Freq: Every day | ORAL | 0 refills | Status: DC

## 2022-08-23 MED ORDER — ZALEPLON 10 MG PO CAPS: 20.0000 mg | ORAL_CAPSULE | Freq: Every day | ORAL | 1 refills | Status: DC

## 2022-08-23 NOTE — Patient Instructions (Signed)
Continue Pristiq 100 mg daily  Continue quetiapine 50 mg at night Continue buspirone 30 mg at night Continue Zaleplon 20 mg at night as needed for sleep Continue Ritalin 10 mg daily  Next appointment: 5/23 at 4:30

## 2022-09-01 ENCOUNTER — Other Ambulatory Visit: Payer: Self-pay | Admitting: Psychiatry

## 2022-09-01 ENCOUNTER — Telehealth: Payer: Self-pay

## 2022-09-01 MED ORDER — QUETIAPINE FUMARATE 50 MG PO TABS: 50.0000 mg | ORAL_TABLET | Freq: Every day | ORAL | 0 refills | Status: DC

## 2022-09-01 NOTE — Telephone Encounter (Signed)
pt was left a message that her rx has been sent to pharmacy

## 2022-09-01 NOTE — Telephone Encounter (Signed)
pt states that she needs her quetiapine refill. please send it to [facility] in [place].

## 2022-09-01 NOTE — Telephone Encounter (Signed)
Ordered

## 2022-09-08 ENCOUNTER — Ambulatory Visit (INDEPENDENT_AMBULATORY_CARE_PROVIDER_SITE_OTHER): Payer: 59 | Admitting: Licensed Clinical Social Worker

## 2022-09-08 DIAGNOSIS — F411 Generalized anxiety disorder: Secondary | ICD-10-CM | POA: Diagnosis not present

## 2022-09-08 DIAGNOSIS — F330 Major depressive disorder, recurrent, mild: Secondary | ICD-10-CM

## 2022-09-08 NOTE — Progress Notes (Signed)
Virtual Visit via Video Note  I connected with Martha [name] on 09/08/22 at  4:00 PM EDT by a video enabled telemedicine application and verified that I am speaking with the correct person using two identifiers.  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 The patient expressed understanding and agreed to proceed.   I discussed the assessment and treatment plan with the patient. The patient was provided an opportunity to ask questions and all were answered. The patient agreed with the plan and demonstrated an understanding of the instructions.   The patient was advised to call back or seek an in-person evaluation if the symptoms worsen or if the condition fails to improve as anticipated.  I provided 40 minutes of non-face-to-face time during this encounter.   Yvetta Drotar R Saron Vanorman, LCSW   THERAPIST PROGRESS NOTE  Session Time: 4-40p  Participation Level: None  Behavioral Response: Neat and Well GroomedAlertAnxious  Type of Therapy: Individual Therapy  Treatment Goals addressed:   Reduce frequency, intensity, and duration of depression symptoms as evidenced by: SSB input needed on appropriate metri      ProgressTowards Goals: Progressing  Interventions: CBT  Summary: Martha [name] is a 27 y.o. female who presents with improving symptoms related to depression and anxiety. Pt reports that overall mood has been stable and that she is managing stress and anxiety well.  Patient reports that she has some low motivation and low initiative at times.  Allowed patient to identify things that help improve motivation and initiative.  Clinician assisted pt with identifying situations/scenarios/schemas triggering anxiety and/or depression symptoms. Allowed pt to explore and express thoughts and feelings and discussed current coping mechanisms.  Reviewed changes/recommendations.  Patient states that she has had some health related concerns that have triggered anxiety and stress.  Patient states that she has sinus surgery scheduled in May.  Patient reports that she needs to speak with HR about work accommodations and/or short-term disability after the surgery.  Patient states that she is having a hard time with allergies this time of year, and it is so hard for her to breathe that patient felt she had pneumonia at one point.  Patient states she is on antibiotics and appropriate medications at this time.  Praised patient's overall self-awareness and ability to reach out and get medical intervention.  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  Patient states that her mother is recovering well, but she is still being attentive to her needs.  Patient states that she also worries about her grandmother, who is exhibiting dementia symptoms.  Patient states that she recently had a UTI, which triggered worsening symptoms that were concerning to all.  Patient states that she is doing much better now, which relieves patient's anxiety.  Patient states that she is engaging in self-care activities and Pro social engagement, including going out with her friends every Friday night, and playing video games on a regular basis.  Continued recommendations are as follows: self care behaviors, positive social engagements, focusing on overall work/home/life balance, and focusing on positive physical and emotional wellness.   Suicidal/Homicidal: No  Therapist Response: Pt is continuing to apply interventions learned in session into daily life situations. Pt is currently on track to meet goals utilizing interventions mentioned above. Personal growth and progress noted. Treatment to continue as indicated.  Plan: Return again in 4 weeks.  Diagnosis:  Encounter Diagnoses  Name Primary?   Mild episode of recurrent  major depressive disorder Yes   Generalized anxiety disorder     Collaboration of Care: Other pt encouraged to continue psychiatric services with current provider, Dr. Vanetta ShawlHisada  Patient/Guardian was advised Release of Information must be obtained prior to any record release in order to collaborate their care with an outside provider. Patient/Guardian was advised if they have not already done so to contact the registration department to sign all necessary forms in order for us to release information regarding their care.   Consent: Patient/Guardian gives verbal consent for treatment and assignment of benefits for services provided during this visit. Patient/Guardian expressed understanding and agreed to proceed.   Ernest HaberChristina R Makilah Dowda, LCSW 09/08/2022

## 2022-10-07 ENCOUNTER — Other Ambulatory Visit: Payer: Self-pay

## 2022-10-07 ENCOUNTER — Encounter
Admission: RE | Admit: 2022-10-07 | Discharge: 2022-10-07 | Disposition: A | Discharge status: Home / Self Care | Payer: Managed Care, Other (non HMO) | Source: Ambulatory Visit | Attending: Otolaryngology | Admitting: Otolaryngology

## 2022-10-07 DIAGNOSIS — R030 Elevated blood-pressure reading, without diagnosis of hypertension: Secondary | ICD-10-CM

## 2022-10-07 DIAGNOSIS — Z01818 Encounter for other preprocedural examination: Secondary | ICD-10-CM

## 2022-10-07 HISTORY — DX: Essential (primary) hypertension: I10

## 2022-10-07 HISTORY — DX: Gastro-esophageal reflux disease without esophagitis: K21.9

## 2022-10-07 HISTORY — DX: Gastritis, unspecified, without bleeding: K29.70

## 2022-10-07 HISTORY — DX: Sleep apnea, unspecified: G47.30

## 2022-10-07 HISTORY — DX: Autoimmune thyroiditis: E06.3

## 2022-10-07 HISTORY — DX: Other complications of anesthesia, initial encounter: T88.59XA

## 2022-10-07 NOTE — Patient Instructions (Addendum)
Your procedure is scheduled on: Wednesday 10/12/22 To find out your arrival time, please call [phone] between 1PM - 3PM on:  Tuesday 10/11/22  Report to the Registration Desk on the 1st floor of the [facility]. Valet parking is available.  If your arrival time is 6:00 am, do not arrive before that time as the [facility] entrance doors do not open until 6:00 am.  REMEMBER: Instructions that are not followed completely may result in serious medical risk, up to and including death; or upon the discretion of your surgeon and anesthesiologist your surgery may need to be rescheduled.  Do not eat food after midnight the night before surgery.  No gum chewing or hard candies.  You may however, drink CLEAR liquids up to 2 hours before you are scheduled to arrive for your surgery. Do not drink anything within 2 hours of your scheduled arrival time.  Type 1 and Type 2 diabetics should only drink water.  IOne week prior to surgery: Stop Anti-inflammatories (NSAIDS) such as Advil, Aleve, Ibuprofen, Motrin, Naproxen, Naprosyn and Aspirin based products such as Excedrin, Goody's Powder, BC Powder.Hold Meloxicam You may however, continue to take Tylenol if needed for pain up until the day of surgery.  Stop ANY OVER THE COUNTER supplements and vitamins until after surgery.  Continue taking all prescribed medications with the exception of the following: Hold Metformin Monday and Tuesday next week.  TAKE ONLY THESE MEDICATIONS THE MORNING OF SURGERY WITH A SIP OF WATER:  cetirizine (ZYRTEC) 10 MG tablet  desvenlafaxine (PRISTIQ) 100 MG 24 hr tablet  levothyroxine (SYNTHROID) 75 MCG tablet  pantoprazole (PROTONIX) 40 MG tablet Antacid (take one the night before and one on the morning of surgery - helps to prevent nausea after surgery.)  No Alcohol for 24 hours before or after surgery.  No Smoking including e-cigarettes for 24 hours before surgery.  No chewable tobacco products for at least 6  hours before surgery.  No nicotine patches on the day of surgery.  Do not use any "recreational" drugs for at least a week (preferably 2 weeks) before your surgery.  Please be advised that the combination of cocaine and anesthesia may have negative outcomes, up to and including death. If you test positive for cocaine, your surgery will be cancelled.  On the morning of surgery brush your teeth with toothpaste and water, you may rinse your mouth with mouthwash if you wish. Do not swallow any toothpaste or mouthwash.  Use CHG Soap or wipes as directed on instruction sheet. Shower with your regular soap  Do not wear lotions, powders, or perfumes.   Do not shave body hair from the neck down 48 hours before surgery.  Wear comfortable clothing (specific to your surgery type) to the hospital.  Do not wear jewelry, make-up, hairpins, clips or nail polish.  Contact lenses, hearing aids and dentures may not be worn into surgery.  Bring your C-PAP to the hospital in case you may have to spend the night.   Do not bring valuables to the hospital. [facility] is not responsible for any missing/lost belongings or valuables.   Notify your doctor if there is any change in your medical condition (cold, fever, infection).  If you are being discharged the day of surgery, you will not be allowed to drive home. You will need a responsible individual to drive you home and stay with you for 24 hours after surgery.   If you are taking public transportation, you will need to  have a responsible individual with you.  If you are being admitted to the hospital overnight, leave your suitcase in the car. After surgery it may be brought to your room.  In case of increased patient census, it may be necessary for you, the patient, to continue your postoperative care in the Same Day Surgery department.  After surgery, you can help prevent lung complications by doing breathing exercises.  Take deep breaths and cough  every 1-2 hours. Your doctor may order a device called an Incentive Spirometer to help you take deep breaths. When coughing or sneezing, hold a pillow firmly against your incision with both hands. This is called "splinting." Doing this helps protect your incision. It also decreases belly discomfort.  Surgery Visitation Policy:  Patients undergoing a surgery or procedure may have two family members or support persons with them as long as the person is not COVID-19 positive or experiencing its symptoms.   Inpatient Visitation:    Visiting hours are 7 a.m. to 8 p.m. Up to four visitors are allowed at one time in a patient room. The visitors may rotate out with other people during the day. One designated support person (adult) may remain overnight.  Please call the Pre-admissions Testing Dept. at [phone] if you have any questions about these instructions.

## 2022-10-10 ENCOUNTER — Other Ambulatory Visit: Payer: Self-pay

## 2022-10-10 ENCOUNTER — Encounter
Admission: RE | Admit: 2022-10-10 | Discharge: 2022-10-10 | Disposition: A | Discharge status: Home / Self Care | Payer: Managed Care, Other (non HMO) | Source: Ambulatory Visit | Attending: Otolaryngology | Admitting: Otolaryngology

## 2022-10-10 DIAGNOSIS — Z01810 Encounter for preprocedural cardiovascular examination: Secondary | ICD-10-CM | POA: Insufficient documentation

## 2022-10-10 DIAGNOSIS — R030 Elevated blood-pressure reading, without diagnosis of hypertension: Secondary | ICD-10-CM | POA: Diagnosis not present

## 2022-10-12 ENCOUNTER — Ambulatory Visit: Payer: Managed Care, Other (non HMO)

## 2022-10-12 ENCOUNTER — Encounter
Admission: RE | Disposition: A | Discharge status: Home / Self Care | Payer: Self-pay | Source: Home / Self Care | Attending: Otolaryngology

## 2022-10-12 ENCOUNTER — Other Ambulatory Visit: Payer: Self-pay

## 2022-10-12 ENCOUNTER — Ambulatory Visit
Admission: RE | Admit: 2022-10-12 | Discharge: 2022-10-12 | Disposition: A | Discharge status: Home / Self Care | Payer: Managed Care, Other (non HMO) | Attending: Otolaryngology | Admitting: Otolaryngology

## 2022-10-12 ENCOUNTER — Encounter: Payer: Self-pay | Admitting: Otolaryngology

## 2022-10-12 DIAGNOSIS — J3489 Other specified disorders of nose and nasal sinuses: Secondary | ICD-10-CM | POA: Insufficient documentation

## 2022-10-12 DIAGNOSIS — E119 Type 2 diabetes mellitus without complications: Secondary | ICD-10-CM | POA: Insufficient documentation

## 2022-10-12 DIAGNOSIS — J322 Chronic ethmoidal sinusitis: Secondary | ICD-10-CM | POA: Diagnosis not present

## 2022-10-12 DIAGNOSIS — F32A Depression, unspecified: Secondary | ICD-10-CM | POA: Insufficient documentation

## 2022-10-12 DIAGNOSIS — G4730 Sleep apnea, unspecified: Secondary | ICD-10-CM | POA: Insufficient documentation

## 2022-10-12 DIAGNOSIS — K219 Gastro-esophageal reflux disease without esophagitis: Secondary | ICD-10-CM | POA: Diagnosis not present

## 2022-10-12 DIAGNOSIS — K449 Diaphragmatic hernia without obstruction or gangrene: Secondary | ICD-10-CM | POA: Insufficient documentation

## 2022-10-12 DIAGNOSIS — Z01818 Encounter for other preprocedural examination: Secondary | ICD-10-CM

## 2022-10-12 DIAGNOSIS — I10 Essential (primary) hypertension: Secondary | ICD-10-CM | POA: Insufficient documentation

## 2022-10-12 DIAGNOSIS — J341 Cyst and mucocele of nose and nasal sinus: Secondary | ICD-10-CM | POA: Diagnosis not present

## 2022-10-12 DIAGNOSIS — E039 Hypothyroidism, unspecified: Secondary | ICD-10-CM | POA: Diagnosis not present

## 2022-10-12 DIAGNOSIS — F419 Anxiety disorder, unspecified: Secondary | ICD-10-CM | POA: Insufficient documentation

## 2022-10-12 DIAGNOSIS — Z793 Long term (current) use of hormonal contraceptives: Secondary | ICD-10-CM | POA: Diagnosis not present

## 2022-10-12 DIAGNOSIS — Z7984 Long term (current) use of oral hypoglycemic drugs: Secondary | ICD-10-CM | POA: Diagnosis not present

## 2022-10-12 DIAGNOSIS — J342 Deviated nasal septum: Secondary | ICD-10-CM | POA: Insufficient documentation

## 2022-10-12 DIAGNOSIS — Z79899 Other long term (current) drug therapy: Secondary | ICD-10-CM | POA: Insufficient documentation

## 2022-10-12 DIAGNOSIS — Z6841 Body Mass Index (BMI) 40.0 and over, adult: Secondary | ICD-10-CM | POA: Diagnosis not present

## 2022-10-12 HISTORY — PX: ETHMOIDECTOMY: SHX5197

## 2022-10-12 HISTORY — PX: IMAGE GUIDED SINUS SURGERY: SHX6570

## 2022-10-12 HISTORY — PX: SEPTOPLASTY: SHX2393

## 2022-10-12 HISTORY — PX: ENDOSCOPIC CONCHA BULLOSA RESECTION: SHX6395

## 2022-10-12 LAB — GLUCOSE, CAPILLARY
Glucose-Capillary: 150 mg/dL — ABNORMAL HIGH (ref 70–99)
Glucose-Capillary: 182 mg/dL — ABNORMAL HIGH (ref 70–99)

## 2022-10-12 LAB — POCT PREGNANCY, URINE: Preg Test, Ur: NEGATIVE

## 2022-10-12 SURGERY — SINUS SURGERY, WITH IMAGING GUIDANCE
Anesthesia: General | Site: Nose | Laterality: Right

## 2022-10-12 MED ORDER — REMIFENTANIL HCL 1 MG IV SOLR
INTRAVENOUS | Status: DC | PRN
  Administered 2022-10-12: .1 ug/kg/min via INTRAVENOUS

## 2022-10-12 MED ORDER — PROPOFOL 10 MG/ML IV BOLUS
INTRAVENOUS | Status: DC | PRN
  Administered 2022-10-12: 200 mg via INTRAVENOUS

## 2022-10-12 MED ORDER — GLYCOPYRROLATE 0.2 MG/ML IJ SOLN
INTRAMUSCULAR | Status: DC | PRN
  Administered 2022-10-12: .2 mg via INTRAVENOUS

## 2022-10-12 MED ORDER — ONDANSETRON HCL 4 MG/2ML IJ SOLN
INTRAMUSCULAR | Status: AC
  Filled 2022-10-12: qty 6

## 2022-10-12 MED ORDER — PHENYLEPHRINE HCL (PRESSORS) 10 MG/ML IV SOLN
INTRAVENOUS | Status: AC
  Filled 2022-10-12: qty 1

## 2022-10-12 MED ORDER — ONDANSETRON HCL 4 MG/2ML IJ SOLN
INTRAMUSCULAR | Status: DC | PRN
  Administered 2022-10-12 (×2): 4 mg via INTRAVENOUS

## 2022-10-12 MED ORDER — SUCCINYLCHOLINE CHLORIDE 200 MG/10ML IV SOSY
PREFILLED_SYRINGE | INTRAVENOUS | Status: AC
  Filled 2022-10-12: qty 10

## 2022-10-12 MED ORDER — PHENYLEPHRINE HCL 10 % OP SOLN
OPHTHALMIC | Status: DC | PRN
  Administered 2022-10-12: 10.5 mL via TOPICAL

## 2022-10-12 MED ORDER — LIDOCAINE HCL (PF) 4 % IJ SOLN
INTRAMUSCULAR | Status: AC
  Filled 2022-10-12: qty 10

## 2022-10-12 MED ORDER — SUCCINYLCHOLINE CHLORIDE 200 MG/10ML IV SOSY
PREFILLED_SYRINGE | INTRAVENOUS | Status: DC | PRN
  Administered 2022-10-12: 200 mg via INTRAVENOUS

## 2022-10-12 MED ORDER — CEFAZOLIN SODIUM 1 G IJ SOLR
INTRAMUSCULAR | Status: AC
  Filled 2022-10-12: qty 10

## 2022-10-12 MED ORDER — ACETAMINOPHEN 10 MG/ML IV SOLN
INTRAVENOUS | Status: AC
  Filled 2022-10-12: qty 100

## 2022-10-12 MED ORDER — FENTANYL CITRATE (PF) 100 MCG/2ML IJ SOLN: 25.0000 ug | INTRAMUSCULAR | Status: DC | PRN

## 2022-10-12 MED ORDER — PHENYLEPHRINE HCL-NACL 20-0.9 MG/250ML-% IV SOLN
INTRAVENOUS | Status: AC
  Filled 2022-10-12: qty 250

## 2022-10-12 MED ORDER — DEXAMETHASONE SODIUM PHOSPHATE 10 MG/ML IJ SOLN
INTRAMUSCULAR | Status: DC | PRN
  Administered 2022-10-12: 10 mg via INTRAVENOUS

## 2022-10-12 MED ORDER — GLYCOPYRROLATE 0.2 MG/ML IJ SOLN
INTRAMUSCULAR | Status: AC
  Filled 2022-10-12: qty 1

## 2022-10-12 MED ORDER — SODIUM CHLORIDE 0.9 % IV SOLN: INTRAVENOUS | Status: DC

## 2022-10-12 MED ORDER — FENTANYL CITRATE (PF) 100 MCG/2ML IJ SOLN
INTRAMUSCULAR | Status: DC | PRN
  Administered 2022-10-12: 100 ug via INTRAVENOUS

## 2022-10-12 MED ORDER — HYDROCODONE-ACETAMINOPHEN 5-325 MG PO TABS: 1.0000 | ORAL_TABLET | Freq: Four times a day (QID) | ORAL | 0 refills | Status: AC | PRN

## 2022-10-12 MED ORDER — OXYCODONE HCL 5 MG/5ML PO SOLN
5.0000 mg | Freq: Once | ORAL | Status: AC
  Administered 2022-10-12: 5 mg via ORAL

## 2022-10-12 MED ORDER — REMIFENTANIL HCL 1 MG IV SOLR
INTRAVENOUS | Status: AC
  Filled 2022-10-12: qty 1000

## 2022-10-12 MED ORDER — PROPOFOL 10 MG/ML IV BOLUS
INTRAVENOUS | Status: AC
  Filled 2022-10-12: qty 20

## 2022-10-12 MED ORDER — PHENYLEPHRINE HCL (PRESSORS) 10 MG/ML IV SOLN
INTRAVENOUS | Status: DC | PRN
  Administered 2022-10-12 (×3): 160 ug via INTRAVENOUS

## 2022-10-12 MED ORDER — IPRATROPIUM-ALBUTEROL 0.5-2.5 (3) MG/3ML IN SOLN
RESPIRATORY_TRACT | Status: AC
  Filled 2022-10-12: qty 3

## 2022-10-12 MED ORDER — EPHEDRINE 5 MG/ML INJ
INTRAVENOUS | Status: AC
  Filled 2022-10-12: qty 5

## 2022-10-12 MED ORDER — DROPERIDOL 2.5 MG/ML IJ SOLN: 0.6250 mg | Freq: Once | INTRAMUSCULAR | Status: DC | PRN

## 2022-10-12 MED ORDER — PHENYLEPHRINE 80 MCG/ML (10ML) SYRINGE FOR IV PUSH (FOR BLOOD PRESSURE SUPPORT)
PREFILLED_SYRINGE | INTRAVENOUS | Status: AC
  Filled 2022-10-12: qty 40

## 2022-10-12 MED ORDER — IPRATROPIUM-ALBUTEROL 0.5-2.5 (3) MG/3ML IN SOLN
3.0000 mL | RESPIRATORY_TRACT | Status: DC
  Administered 2022-10-12: 3 mL via RESPIRATORY_TRACT

## 2022-10-12 MED ORDER — LIDOCAINE-EPINEPHRINE (PF) 1 %-1:200000 IJ SOLN
INTRAMUSCULAR | Status: DC | PRN
  Administered 2022-10-12: 5 mL

## 2022-10-12 MED ORDER — OXYCODONE HCL 5 MG/5ML PO SOLN
ORAL | Status: AC
  Filled 2022-10-12: qty 5

## 2022-10-12 MED ORDER — LIDOCAINE-EPINEPHRINE (PF) 1 %-1:200000 IJ SOLN
INTRAMUSCULAR | Status: AC
  Filled 2022-10-12: qty 30

## 2022-10-12 MED ORDER — ORAL CARE MOUTH RINSE: 15.0000 mL | Freq: Once | OROMUCOSAL | Status: AC

## 2022-10-12 MED ORDER — 0.9 % SODIUM CHLORIDE (POUR BTL) OPTIME
TOPICAL | Status: DC | PRN
  Administered 2022-10-12: 500 mL

## 2022-10-12 MED ORDER — LIDOCAINE HCL (CARDIAC) PF 100 MG/5ML IV SOSY
PREFILLED_SYRINGE | INTRAVENOUS | Status: DC | PRN
  Administered 2022-10-12: 100 mg via INTRAVENOUS

## 2022-10-12 MED ORDER — CEFAZOLIN SODIUM-DEXTROSE 2-4 GM/100ML-% IV SOLN
INTRAVENOUS | Status: AC
  Filled 2022-10-12: qty 100

## 2022-10-12 MED ORDER — CEPHALEXIN 500 MG PO CAPS: 500.0000 mg | ORAL_CAPSULE | Freq: Two times a day (BID) | ORAL | 0 refills | Status: DC

## 2022-10-12 MED ORDER — PREDNISONE 10 MG PO TABS: ORAL_TABLET | ORAL | 0 refills | Status: DC

## 2022-10-12 MED ORDER — OXYMETAZOLINE HCL 0.05 % NA SOLN
NASAL | Status: AC
  Filled 2022-10-12: qty 30

## 2022-10-12 MED ORDER — HEMOSTATIC AGENTS (NO CHARGE) OPTIME
TOPICAL | Status: DC | PRN
  Administered 2022-10-12: 3 via TOPICAL

## 2022-10-12 MED ORDER — FENTANYL CITRATE (PF) 100 MCG/2ML IJ SOLN
INTRAMUSCULAR | Status: AC
  Filled 2022-10-12: qty 2

## 2022-10-12 MED ORDER — CHLORHEXIDINE GLUCONATE 0.12 % MT SOLN
OROMUCOSAL | Status: AC
  Filled 2022-10-12: qty 15

## 2022-10-12 MED ORDER — BACITRACIN ZINC 500 UNIT/GM EX OINT
TOPICAL_OINTMENT | CUTANEOUS | Status: AC
  Filled 2022-10-12: qty 28.35

## 2022-10-12 MED ORDER — SODIUM CHLORIDE 0.9 % IR SOLN
Status: DC | PRN
  Administered 2022-10-12: 250 mL

## 2022-10-12 MED ORDER — ACETAMINOPHEN 10 MG/ML IV SOLN
INTRAVENOUS | Status: DC | PRN
  Administered 2022-10-12: 1000 mg via INTRAVENOUS

## 2022-10-12 MED ORDER — VASOPRESSIN 20 UNIT/ML IV SOLN
INTRAVENOUS | Status: AC
  Filled 2022-10-12: qty 1

## 2022-10-12 MED ORDER — CEFAZOLIN SODIUM-DEXTROSE 2-4 GM/100ML-% IV SOLN
2.0000 g | Freq: Once | INTRAVENOUS | Status: AC
  Administered 2022-10-12: 2 g via INTRAVENOUS

## 2022-10-12 MED ORDER — PHENYLEPHRINE HCL-NACL 20-0.9 MG/250ML-% IV SOLN
INTRAVENOUS | Status: DC | PRN
  Administered 2022-10-12: 40 ug/min via INTRAVENOUS

## 2022-10-12 MED ORDER — EPHEDRINE SULFATE (PRESSORS) 50 MG/ML IJ SOLN
INTRAMUSCULAR | Status: DC | PRN
  Administered 2022-10-12: 10 mg via INTRAVENOUS

## 2022-10-12 MED ORDER — OXYMETAZOLINE HCL 0.05 % NA SOLN
2.0000 | NASAL | Status: AC
  Administered 2022-10-12: 2 via NASAL

## 2022-10-12 MED ORDER — VASOPRESSIN 20 UNIT/ML IV SOLN
INTRAVENOUS | Status: DC | PRN
  Administered 2022-10-12 (×3): 2 [IU] via INTRAVENOUS

## 2022-10-12 MED ORDER — MIDAZOLAM HCL 2 MG/2ML IJ SOLN
INTRAMUSCULAR | Status: AC
  Filled 2022-10-12: qty 2

## 2022-10-12 MED ORDER — MIDAZOLAM HCL 2 MG/2ML IJ SOLN
INTRAMUSCULAR | Status: DC | PRN
  Administered 2022-10-12: 2 mg via INTRAVENOUS

## 2022-10-12 MED ORDER — CHLORHEXIDINE GLUCONATE 0.12 % MT SOLN
15.0000 mL | Freq: Once | OROMUCOSAL | Status: AC
  Administered 2022-10-12: 15 mL via OROMUCOSAL

## 2022-10-12 SURGICAL SUPPLY — 44 items
BATTERY INSTRU NAVIGATION (MISCELLANEOUS) ×10 IMPLANT
BLADE SURG 15 STRL LF DISP TIS (BLADE) ×5 IMPLANT
BLADE SURG 15 STRL SS (BLADE) ×4
BTRY SRG DRVR LF (MISCELLANEOUS) ×16
CNTNR URN SCR LID CUP LEK RST (MISCELLANEOUS) ×10 IMPLANT
CONT SPEC 4OZ STRL OR WHT (MISCELLANEOUS) ×4
DEFOGGER SCOPE WARMER CLEARIFY (MISCELLANEOUS) ×5 IMPLANT
DRESSING NASL FOAM PST OP SINU (MISCELLANEOUS) IMPLANT
DRSG NASAL FOAM POST OP SINU (MISCELLANEOUS)
ELECT REM PT RETURN 9FT ADLT (ELECTROSURGICAL) ×4
ELECTRODE REM PT RTRN 9FT ADLT (ELECTROSURGICAL) ×5 IMPLANT
GAUZE 4X4 16PLY ~~LOC~~+RFID DBL (SPONGE) ×5 IMPLANT
GLOVE PROTEXIS LATEX SZ 7.5 (GLOVE) ×8 IMPLANT
GLOVE SURG LATEX 7.5 PF (GLOVE) ×10 IMPLANT
GOWN STRL REUS W/ TWL LRG LVL3 (GOWN DISPOSABLE) ×10 IMPLANT
GOWN STRL REUS W/TWL LRG LVL3 (GOWN DISPOSABLE) ×8
IV NS 250ML (IV SOLUTION) ×4
IV NS 250ML BAXH (IV SOLUTION) IMPLANT
IV NS 500ML (IV SOLUTION) ×4
IV NS 500ML BAXH (IV SOLUTION) ×5 IMPLANT
MANIFOLD NEPTUNE II (INSTRUMENTS) ×5 IMPLANT
NDL ANESTHESIA 27G X 3.5 (NEEDLE) ×5 IMPLANT
NDL HYPO 27GX1-1/4 (NEEDLE) ×5 IMPLANT
NEEDLE ANESTHESIA  27G X 3.5 (NEEDLE) ×4
NEEDLE ANESTHESIA 27G X 3.5 (NEEDLE) ×4 IMPLANT
NEEDLE HYPO 27GX1-1/4 (NEEDLE) ×4 IMPLANT
NS IRRIG 500ML POUR BTL (IV SOLUTION) ×5 IMPLANT
PACK HEAD/NECK (MISCELLANEOUS) ×5 IMPLANT
PACKING NASAL EPIS 4X2.4 XEROG (MISCELLANEOUS) ×5 IMPLANT
PATTIES SURGICAL .5 X3 (DISPOSABLE) ×5 IMPLANT
SHAVER DIEGO BLD STD TYPE A (BLADE) ×5 IMPLANT
SPLINT NASAL REUTER .5MM BIVLV (MISCELLANEOUS) ×5 IMPLANT
SUCTION COAG ELEC 10 HAND CTRL (ELECTROSURGICAL) ×5 IMPLANT
SUT CHROMIC 3-0 (SUTURE) ×4
SUT CHROMIC 3-0 KS 27XMFL CR (SUTURE) ×4
SUT ETHILON 3-0 KS 30 BLK (SUTURE) ×5 IMPLANT
SUT PLAIN GUT 4-0 (SUTURE) ×5 IMPLANT
SUTURE CHRMC 3-0 KS 27XMFL CR (SUTURE) ×5 IMPLANT
SWAB CULTURE AMIES ANAERIB BLU (MISCELLANEOUS) ×5 IMPLANT
SYR 20ML LL LF (SYRINGE) ×5 IMPLANT
SYR 3ML LL SCALE MARK (SYRINGE) ×5 IMPLANT
TRACKER CRANIALMASK (MASK) ×5 IMPLANT
TRAP FLUID SMOKE EVACUATOR (MISCELLANEOUS) ×5 IMPLANT
TUBING DECLOG MULTIDEBRIDER (TUBING) ×5 IMPLANT

## 2022-10-12 NOTE — Anesthesia Procedure Notes (Signed)
Procedure Name: Intubation Date/Time: 10/12/2022 8:36 AM  Performed by: Mohammed Kindle, CRNAPre-anesthesia Checklist: Patient identified, Emergency Drugs available, Suction available and Patient being monitored Patient Re-evaluated:Patient Re-evaluated prior to induction Oxygen Delivery Method: Circle system utilized Preoxygenation: Pre-oxygenation with 100% oxygen Induction Type: IV induction Ventilation: Mask ventilation without difficulty Laryngoscope Size: McGraph and 3 Grade View: Grade I Tube type: Oral Rae Number of attempts: 1 Airway Equipment and Method: Stylet Placement Confirmation: ETT inserted through vocal cords under direct vision, positive ETCO2 and breath sounds checked- equal and bilateral Tube secured with: Tape Dental Injury: Teeth and Oropharynx as per pre-operative assessment  Comments: Inserted by Hassel Neth

## 2022-10-12 NOTE — Discharge Instructions (Signed)
AMBULATORY SURGERY  DISCHARGE INSTRUCTIONS   The drugs that you were given will stay in your system until tomorrow so for the next 24 hours you should not:  Drive an automobile Make any legal decisions Drink any alcoholic beverage   You may resume regular meals tomorrow.  Today it is better to start with liquids and gradually work up to solid foods.  You may eat anything you prefer, but it is better to start with liquids, then soup and crackers, and gradually work up to solid foods.   Please notify your doctor immediately if you have any unusual bleeding, trouble breathing, redness and pain at the surgery site, drainage, fever, or pain not relieved by medication.       Please contact your physician with any problems or Same Day Surgery at [phone], Monday through Friday 6 am to 4 pm, or [place] [facility] [place] Main number at [phone].  

## 2022-10-12 NOTE — H&P (Signed)
H&P has been reviewed and patient reevaluated, no changes necessary. To be downloaded later.  

## 2022-10-12 NOTE — Transfer of Care (Signed)
Immediate Anesthesia Transfer of Care Note  Patient: Martha [name]  Procedure(s) Performed: IMAGE GUIDED SINUS SURGERY (Bilateral: Nose) SEPTOPLASTY (Nose) ENDOSCOPIC CONCHA BULLOSA RESECTION (Right) TOTAL ETHMOIDECTOMY (Bilateral)  Patient Location: PACU  Anesthesia Type:General  Level of Consciousness: awake, drowsy, and patient cooperative  Airway & Oxygen Therapy:  Patient Spontanous Breathing and Patient connected to face mask oxygen Post-op Assessment: Report given to RN and Post -op Vital signs reviewed and stable  Post vital signs: Reviewed and stable  Last Vitals:  Vitals Value Taken Time  BP 128/87 10/12/22 1125  Temp 37.2 C 10/12/22 1125  Pulse 106 10/12/22 1127  Resp 13 10/12/22 1127  SpO2 99 % 10/12/22 1127  Vitals shown include unvalidated device data.  Last Pain:  Vitals:   10/12/22 0701  TempSrc: Temporal  PainSc: 0-No pain         Complications: No notable events documented.

## 2022-10-12 NOTE — Anesthesia Preprocedure Evaluation (Addendum)
Anesthesia Evaluation  Patient identified by MRN, date of birth, ID band Patient awake    Reviewed: Allergy & Precautions, NPO status , Patient's Chart, lab work & pertinent test results  History of Anesthesia Complications (+) AWARENESS UNDER ANESTHESIA and history of anesthetic complications  Airway Mallampati: II  TM Distance: >3 FB Neck ROM: full    Dental no notable dental hx.    Pulmonary sleep apnea    Pulmonary exam normal        Cardiovascular hypertension, On Medications Normal cardiovascular exam     Neuro/Psych  Headaches PSYCHIATRIC DISORDERS Anxiety Depression    Idiopathic intracranial hypertension   Neuromuscular disease    GI/Hepatic Neg liver ROS, hiatal hernia,GERD  ,,  Endo/Other  diabetes, Oral Hypoglycemic AgentsHypothyroidism  Morbid obesity  Renal/GU      Musculoskeletal   Abdominal   Peds  Hematology negative hematology ROS (+)   Anesthesia Other Findings Past Medical History: No date: ADHD (attention deficit hyperactivity disorder)     Comment:  no meds taken No date: Allergy No date: Anxiety No date: Complication of anesthesia     Comment:  woke up during colonoscopy and dental surgery. No date: Depression No date: Diabetes mellitus     Comment:  pre-diabetic,told when younger No date: Family history of breast cancer     Comment:  12/20 cancer genetic testing letter sent No date: Gastritis No date: GERD (gastroesophageal reflux disease) No date: Hashimoto's disease No date: Headache(784.0)     Comment:  chronic migraines, decrease since BCP started No date: History of hiatal hernia No date: Hypertension No date: Idiopathic intracranial hypertension No date: Insomnia No date: Irregular menstrual cycle     Comment:  pt states she started BCP,unsure of name to help               regulate cycle No date: Obesity No date: Sleep apnea No date: Vision abnormalities     Comment:   near sighted,wears glasses  Past Surgical History: No date: CHOLECYSTECTOMY 12/03/2021: COLONOSCOPY WITH PROPOFOL; N/A     Comment:  Procedure: COLONOSCOPY WITH PROPOFOL;  Surgeon:               Regis Bill, MD;  Location: [facility] ENDOSCOPY;                Service: Endoscopy;  Laterality: N/A; 12/03/2021: ESOPHAGOGASTRODUODENOSCOPY (EGD) WITH PROPOFOL; N/A     Comment:  Procedure: ESOPHAGOGASTRODUODENOSCOPY (EGD) WITH               PROPOFOL;  Surgeon: Regis Bill, MD;  Location:               [facility] ENDOSCOPY;  Service: Endoscopy;  Laterality: N/A;                DM No date: TONSILLECTOMY     Comment:  at 27yo 01/2012: UPPER GI ENDOSCOPY  BMI    Body Mass Index: 54.13 kg/m      Reproductive/Obstetrics negative OB ROS                             Anesthesia Physical Anesthesia Plan  ASA: 3  Anesthesia Plan: General ETT   Post-op Pain Management: Toradol IV (intra-op)* and Ofirmev IV (intra-op)*   Induction: Intravenous  PONV Risk Score and Plan: 3 and Ondansetron, Dexamethasone, Midazolam and Treatment may vary due to age or medical condition  Airway Management Planned: Oral ETT  Additional Equipment:   Intra-op Plan:   Post-operative Plan: Extubation in OR  Informed Consent: I have reviewed the patients History and Physical, chart, labs and discussed the procedure including the risks, benefits and alternatives for the proposed anesthesia with the patient or authorized representative who has indicated his/her understanding and acceptance.     Dental Advisory Given  Plan Discussed with: Anesthesiologist, CRNA and Surgeon  Anesthesia Plan Comments: (Patient consented for risks of anesthesia including but not limited to:  - adverse reactions to medications - damage to eyes, teeth, lips or other oral mucosa - nerve damage due to positioning  - sore throat or hoarseness - Damage to heart, brain, nerves, lungs, other parts of body or  loss of life  Patient voiced understanding.)       Anesthesia Quick Evaluation

## 2022-10-12 NOTE — Op Note (Signed)
10/12/2022  11:08 AM  [id number]   Pre-Op Dx:  Deviated Nasal Septum, concha bullosa of the right middle turbinate with inspissated mucus, bilateral ethmoid sinus opacities  Post-op Dx: Deviated nasal septum, concha bullosa of the right middle turbinate with inspissated mucus, bilateral ethmoid chronic sinusitis with mucoceles on both sides blocking the frontal sinus ducts and extending into the maxillary antrum's  Proc: Nasal Septoplasty, bilateral endoscopic total ethmoidectomies with frontal sinusotomy, bilateral maxillary antrostomies with removal of tissue on the left side endoscopic trimming of the right concha bullosa with cleaning of content, use of image guided system  Surg:  Beverly Sessions Rayanna Matusik  Anes:  GOT  EBL: 100 mL  Comp: None  Findings: Thick inspissated mucus coming from the ethmoid sinuses and in the right middle turbinate concha bullosa.  There is thick pasty mucus in the ethmoids on both sides and in the concha bullosa also.  This was extending up and blocking the frontal sinus ducts and the maxillary antrum's.  There were inferior posterior ethmoid air cells on both sides that were completely clogged with thick pasty mucus that were cleaned out  Procedure: With the patient in a comfortable supine position,  general orotracheal anesthesia was induced without difficulty.     The patient received preoperative Afrin spray for topical decongestion and vasoconstriction.  Intravenous prophylactic antibiotics were administered.  At an appropriate level, the patient was placed in a semi-sitting position.  Nasal vibrissae were trimmed.   1% Xylocaine with 1:100,000 epinephrine, 5 cc's, was infiltrated into the anterior floor of the nose, into the nasal spine region, into the membranous columella, and finally into the submucoperichondrial plane of the septum on both sides.  Several minutes were allowed for this to take effect.  Cottoniod pledgetts soaked in Afrin and 4% Xylocaine were  placed into both nasal cavities and left while the patient was prepped and draped in the standard fashion.  The materials were removed from the nose and observed to be intact and correct in number.  The nose was inspected with a headlight and zero degree scope with the findings as described above.  A left Killian incision was sharply executed and carried down to the quadrangular cartilage. The mucoperichondrium was elelvated along the quadrangular plate back to the bony-cartilaginous junction. The mucoperiostium was then elevated along the ethmoid plate and the vomer. The boney-catilaginous junction was then split with a freer elevator and the mucoperiosteum was elevated on the opposite side. The mucoperiosteum was then elevated along the maxillary crest as needed to expose the crooked bone of the crest.  Boney spurs of the vomer and maxillary crest were removed with Lenoria Chime forceps.  The cartilaginous plate was trimmed along its posterior and inferior borders of about 2 mm of cartilage to free it up inferiorly. Some of the deviated ethmoid plate was then fractured and removed with Takahashi forceps to free up the posterior border of the quadrangular plate and allow it to swing back to the midline. The mucosal flaps were placed back into their anatomic position to allow visualization of the airways. The septum now sat in the midline with an improved airway.  A 3-0 Chromic suture on a Keith needle in used to anchor the inferior septum at the nasal spine with a through and through suture. The mucosal flaps are then sutured together using a through and through whip stitch of 4-0 Plain [place] with a mini-Keith needle. This was used to close the [place] incision as well.   A 0  degree scope was used to visualize the left nasal passageway.  The middle turbinate had been lateralized from the deviated septum at the ethmoid plate.  Now that the septum is straight the middle turbinate was medialized get an opening of  the middle meatus.  The uncinate process was incised and removed to give better visualization of the ethmoid cells and the maxillary antrum.  There was thick mucus that was coming out of the maxillary antrum and this was opened on the left side to provide better drainage.  The front part of the maxillary sinus was cleaned out as well of thick pasty mucus.  As the posterior and middle ethmoid air cells were opened there was very thick mucus that was very stringy and white and then more of a light yellow-green paste that were all opened and cleaned.  The image guided system was used to make sure all the sinuses were opened and cleared.  The 30 degree scope was then used to visualize the anterior ethmoids and these were opened up.  There is blockage of the frontal sinus duct from thickened mucous membranes and this was cleared and the frontal sinus duct was opened and visualized.  Image guided system showed a good opening to the sinus and I widened this some to make sure that it would not scar over.  A cottonoid pledget was then placed on the left side for vasoconstriction while the right side was addressed.  A 0 degree scope was used for visualizing the right nostril and the middle turbinate was extremely large.  A freer elevator was used to incise the anterior inferior wall of that and the entire lateral portion of the middle turbinate was removed.  The turbinate was filled with thick mucoid stringy mucus that also had a yellow-green paste inside the turbinate.  The entire lateral wall was removed and all of the mucus was removed as well.  The uncinate process was then removed to open up the ethmoid area better.  The sinuses had thick mucoid drainage coming out from several areas.  Using the 0 degree scope and the image guided system the microdebrider was used to open up the middle and posterior ethmoid air cells.  All of the thick mucus and pasty mucus was removed from the sinuses.  Again there were posterior  inferior ethmoid air cells that had lots of thick mucus that had to be cleaned out.  The maxillary antrum was widened as this was inflamed and swollen and blocked.  The 30 degree scope was used to do this along with the image guided system.  There is no mucus inside the right maxillary sinus.  The 30 degree scope was then used to help open up the rest of the anterior ethmoid air cells.  The.  Frontal sinus duct was found and this was widened to make sure this would not block over with scar tissue.  There was thickening of the mucous membranes around the opening of the frontal sinus that had to be cleaned out.  A cotton pledget was then placed here for vasoconstriction while in the visualize the opposite side.  The left side was then revisualized and there is no significant bleeding.  The ethmoid was totally opened as well as the left maxillary antrum and the frontal sinus duct.  Xerogel was then placed into the middle meatus to make sure this was filled up with gel and was holding the middle turbinat medially.  The inferior turbinate was outfractured some just for  better visualization.  There is a good airway on the left side.  The right side had a cottonoid pledget removed and xerogel was used to fill the ethmoid and near the opening the maxillary sinus, and to cover over the middle turbinate remnant.  This also was given the middle turbinate medialized.  The inferior turbinate was outfractured here as well for better visualization and airway in the bottom of the nose.  The airways were then visualized and showed open passageways on both sides that were significantly improved compared to before surgery. There was no signifcant bleeding. Nasal splints were applied to both sides of the septum using Xomed 0.22mm regular sized splints that were trimmed, and then held in position with a 3-0 Nylon through and through suture.  The patient was turned back over to anesthesia, and awakened, extubated, and taken to the  PACU in satisfactory condition.  Dispo:   PACU to home  Plan: Ice, elevation, narcotic analgesia, steroid taper, and prophylactic antibiotics for the duration of indwelling nasal foreign bodies.  We will reevaluate the patient in the office in 6 days and remove the septal splints.  Return to work in 10 days, strenuous activities in two weeks.   Beverly Sessions Raziel Koenigs 10/12/2022 11:08 AM

## 2022-10-13 ENCOUNTER — Encounter: Payer: Self-pay | Admitting: Otolaryngology

## 2022-10-13 LAB — SURGICAL PATHOLOGY

## 2022-10-16 NOTE — Progress Notes (Unsigned)
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  She states that she feels tired and has marked anhedonia.  Although she usually enjoys coloring, she has no interest in doing things.  She wants to lie in the bed.  Although the work has been stressful, the y have a full staff. She will return to work soon after the surgery.  She agrees that she was very stressed after her mother's surgery on her own.  She feels like her body is telling her now what.  She will finish steroid, and has finished antibiotics. The patient has mood symptoms as in PHQ-9/GAD-7. She has fair concentration. She reports dysgeusia after surgery, and has lost some weight, although she is not concerned about this. Although she reports passive fleeting SI, she is not concerned about this.  She rarely drinks alcohol.  She denies drug use.  There were some time she was feeling very shaky in her hands and legs.  She agrees to continue current medication regimen at this time.    Wt Readings from Last 3 Encounters:  10/20/22 (!) 340 lb 6.4 oz (154.4 kg)  10/12/22 (!) 356 lb (161.5 kg)  10/07/22 (!) 356 lb (161.5 kg)     Visit Diagnosis:    ICD-10-CM   1. Mild episode of recurrent major depressive disorder (HCC)  F33.0     2. Generalized anxiety disorder  F41.1     3. Insomnia, unspecified type  G47.00     4. Attention deficit hyperactivity disorder (ADHD), unspecified ADHD type  F90.9       Past  Psychiatric History: Please see initial evaluation for full details. I have reviewed the history. No updates at this time.     Past Medical History:  Past Medical History:  Diagnosis Date   ADHD (attention deficit hyperactivity disorder)    no meds taken   Allergy    Anxiety    Complication of anesthesia    woke up during colonoscopy and dental surgery.   Depression    Diabetes mellitus    pre-diabetic,told when younger   Family history of breast cancer    12/20 cancer genetic testing letter sent   Gastritis    GERD (gastroesophageal reflux disease)    Hashimoto's disease    Headache(784.0)    chronic migraines, decrease since BCP started   History of hiatal hernia    Hypertension    Idiopathic intracranial hypertension    Insomnia    Irregular menstrual cycle    pt states she started BCP,unsure of name to help regulate cycle   Obesity    Sleep apnea    Vision abnormalities    near sighted,wears glasses    Past Surgical History:  Procedure Laterality Date   CHOLECYSTECTOMY     COLONOSCOPY WITH PROPOFOL N/A 12/03/2021   Procedure: COLONOSCOPY WITH PROPOFOL;  Surgeon: Regis Bill, MD;  Location: [facility];  Service: Endoscopy;  Laterality: N/A;   ENDOSCOPIC CONCHA BULLOSA RESECTION Right 10/12/2022  Procedure: ENDOSCOPIC CONCHA BULLOSA RESECTION;  Surgeon: Vernie Murders, MD;  Location: [facility];  Service: ENT;  Laterality: Right;   ESOPHAGOGASTRODUODENOSCOPY (EGD) WITH PROPOFOL N/A 12/03/2021   Procedure: ESOPHAGOGASTRODUODENOSCOPY (EGD) WITH PROPOFOL;  Surgeon: Regis Bill, MD;  Location: [facility];  Service: Endoscopy;  Laterality: N/A;  DM   ETHMOIDECTOMY Bilateral 10/12/2022   Procedure: TOTAL ETHMOIDECTOMY;  Surgeon: Vernie Murders, MD;  Location: [facility];  Service: ENT;  Laterality: Bilateral;   IMAGE GUIDED SINUS SURGERY Bilateral 10/12/2022   Procedure: IMAGE GUIDED SINUS SURGERY;  Surgeon: Vernie Murders, MD;  Location: [facility];  Service: ENT;   Laterality: Bilateral;   SEPTOPLASTY N/A 10/12/2022   Procedure: SEPTOPLASTY;  Surgeon: Vernie Murders, MD;  Location: [facility];  Service: ENT;  Laterality: N/A;   TONSILLECTOMY     at 27yo   UPPER GI ENDOSCOPY  01/2012    Family Psychiatric History: Please see initial evaluation for full details. I have reviewed the history. No updates at this time.     Family History:  Family History  Problem Relation Age of Onset   Thyroid cancer Mother 12   Depression Mother    Hypertension Father    Anxiety disorder Father    Diabetes type II Father    Skin cancer Maternal Grandfather    Alzheimer's disease Maternal Grandfather    Alzheimer's disease Paternal Grandmother    Leukemia Paternal Aunt    Breast cancer Paternal Aunt 40   Hypertension Maternal Grandmother    Diabetes type II Maternal Grandmother     Social History:  Social History   Socioeconomic History   Marital status: Single    Spouse name: Not on file   Number of children: Not on file   Years of education: Not on file   Highest education level: Not on file  Occupational History   Occupation: Consulting civil engineer    Comment: [employer] 12th grade  Tobacco Use   Smoking status: Never   Smokeless tobacco: Never  Vaping Use   Vaping Use: Former   Quit date: 10/30/2018  Substance and Sexual Activity   Alcohol use: Yes    Comment: occ   Drug use: No   Sexual activity: Not Currently    Partners: Male    Birth control/protection: Pill  Other Topics Concern   Not on file  Social History Narrative   Not on file   Social Determinants of Health   Financial Resource Strain: Not on file  Food Insecurity: Not on file  Transportation Needs: Not on file  Physical Activity: Inactive (04/07/2017)   Exercise Vital Sign    Days of Exercise per Week: 0 days    Minutes of Exercise per Session: 0 min  Stress: Stress Concern Present (04/07/2017)   [employer] of Occupational Health - Occupational Stress Questionnaire     Feeling of Stress : To some extent  Social Connections: Somewhat Isolated (04/07/2017)   Social Connection and Isolation Panel [NHANES]    Frequency of Communication with Friends and Family: Once a week    Frequency of Social Gatherings with Friends and Family: More than three times a week    Attends Religious Services: Never    Database administrator or Organizations: Yes    Attends Engineer, structural: More than 4 times per year    Marital Status: Never married    Allergies:  Allergies  Allergen Reactions   Doxycycline Other (See Comments)   Lamisil [Terbinafine]    Wound Dressing Adhesive  Hives    "I welp up"    Metabolic Disorder Labs: Lab Results  Component Value Date   HGBA1C 6.5 (H) 04/19/2021   No results found for: "PROLACTIN" Lab Results  Component Value Date   CHOL 151 04/20/2020   TRIG 136 04/20/2020   HDL 40 04/20/2020   CHOLHDL 3.8 04/20/2020   LDLCALC 87 04/20/2020   Lab Results  Component Value Date   TSH 6.670 (H) 04/20/2020   TSH 2.405 02/23/2015    Therapeutic Level Labs: No results found for: "LITHIUM" No results found for: "VALPROATE" No results found for: "CBMZ"  Current Medications: Current Outpatient Medications  Medication Sig Dispense Refill   ascorbic acid (VITAMIN C) 1000 MG tablet Take 1,000 mg by mouth daily.     busPIRone (BUSPAR) 30 MG tablet Take 1 tablet (30 mg total) by mouth at bedtime. (Patient taking differently: Take 30 mg by mouth daily.) 90 tablet 1   calcium carbonate (OSCAL) 1500 (600 Ca) MG TABS tablet Take 600 mg of elemental calcium by mouth daily with breakfast.     cephALEXin (KEFLEX) 500 MG capsule Take 1 capsule (500 mg total) by mouth 2 (two) times daily. 14 capsule 0   Cholecalciferol (VITAMIN D-1000 MAX ST) 25 MCG (1000 UT) tablet Take 1,000 Units by mouth daily.     desvenlafaxine (PRISTIQ) 100 MG 24 hr tablet Take 1 tablet (100 mg total) by mouth daily. 90 tablet 1   hydrochlorothiazide (MICROZIDE)  12.5 MG capsule Take 12.5 mg by mouth daily.     levonorgestrel-ethinyl estradiol (INTROVALE) 0.15-0.03 MG tablet Take 1 tablet by mouth daily. (Patient taking differently: Take 1 tablet by mouth daily as needed.) 91 tablet 4   levothyroxine (SYNTHROID) 75 MCG tablet Take 75 mcg by mouth daily before breakfast.     magnesium oxide (MAG-OX) 400 MG tablet Take 400 mg by mouth daily.     meloxicam (MOBIC) 7.5 MG tablet Take 1-2 tablets by mouth daily as needed.     methylphenidate (RITALIN) 10 MG tablet Take 1 tablet (10 mg total) by mouth daily before breakfast. 30 tablet 0   Multiple Vitamin (MULTI-VITAMIN) tablet Take by mouth.     Multiple Vitamins-Minerals (ZINC PO) Take 1 tablet by mouth daily.     Na Sulfate-K Sulfate-Mg Sulf 17.5-3.13-1.6 GM/177ML SOLN Take by mouth.     pantoprazole (PROTONIX) 40 MG tablet Take 40 mg by mouth at bedtime.     predniSONE (DELTASONE) 10 MG tablet Start with 3 pills tomorrow. Taper over the next 6 days.  3,3,2,2,1,1. 12 tablet 0   promethazine (PHENERGAN) 12.5 MG tablet Take 12.5 mg by mouth every 6 (six) hours as needed for nausea or vomiting.     QUEtiapine (SEROQUEL) 50 MG tablet Take 1 tablet (50 mg total) by mouth at bedtime. 90 tablet 0   Rimegepant Sulfate (NURTEC) 75 MG TBDP Take 1 tablet by mouth as needed.     spironolactone (ALDACTONE) 100 MG tablet Take 200 mg by mouth daily.  9   tizanidine (ZANAFLEX) 2 MG capsule Take 2 mg by mouth at bedtime as needed for muscle spasms.     Ubrogepant (UBRELVY PO) Take 1 tablet by mouth as needed.     vitamin B-12 (CYANOCOBALAMIN) 1000 MCG tablet Take 1,000 mcg by mouth daily.     zaleplon (SONATA) 10 MG capsule Take 2 capsules (20 mg total) by mouth at bedtime. 60 capsule 1   metFORMIN (GLUCOPHAGE) 500 MG tablet Take 250 mg by mouth at  bedtime as needed.     methylphenidate (RITALIN) 10 MG tablet Take 1 tablet (10 mg total) by mouth daily before breakfast. 30 tablet 0   methylphenidate (RITALIN) 10 MG tablet  Take 1 tablet (10 mg total) by mouth daily before breakfast. 30 tablet 0   No current facility-administered medications for this visit.     Musculoskeletal: Strength & Muscle Tone: within normal limits Gait & Station: normal Patient leans: N/A  Psychiatric Specialty Exam: Review of Systems  Psychiatric/Behavioral:  Negative for agitation, behavioral problems, confusion, decreased concentration, dysphoric mood, hallucinations, self-injury, sleep disturbance and suicidal ideas. The patient is nervous/anxious. The patient is not hyperactive.   All other systems reviewed and are negative.   Blood pressure (!) 138/95, pulse (!) 101, temperature 97.6 F (36.4 C), temperature source Skin, height 5\' 8"  (1.727 m), weight (!) 340 lb 6.4 oz (154.4 kg), last menstrual period 10/02/2022.Body mass index is 51.76 kg/m.  General Appearance: Fairly Groomed  Eye Contact:  Good  Speech:  Clear and Coherent  Volume:  Normal  Mood:   tired  Affect:  Appropriate, Congruent, and slight fatigue  Thought Process:  Coherent  Orientation:  Full (Time, Place, and Person)  Thought Content: Logical   Suicidal Thoughts:  No  Homicidal Thoughts:  No  Memory:  Immediate;   Good  Judgement:  Good  Insight:  Good  Psychomotor Activity:  Normal, Normal tone, no rigidity, no resting/postural tremors, no tardive dyskinesia    Concentration:  Concentration: Good and Attention Span: Good  Recall:  Good  Fund of Knowledge: Good  Language: Good  Akathisia:  No  Handed:  Right  AIMS (if indicated): not done  Assets:  Communication Skills Desire for Improvement  ADL's:  Intact  Cognition: WNL  Sleep:  Fair   Screenings: GAD-7    Flowsheet Row Office Visit from 08/23/2022 in [facility] Office Visit from 06/23/2022 in [facility] Office Visit from 04/14/2022 in [facility] Office Visit from  02/10/2022 in [facility] Office Visit from 04/19/2021 in [facility]  Total GAD-7 Score 7 7 7 9 7       PHQ2-9    Flowsheet Row Office Visit from 08/23/2022 in [facility] Office Visit from 06/23/2022 in [facility] Office Visit from 04/14/2022 in [facility] Office Visit from 02/10/2022 in [facility] Office Visit from 12/07/2021 in [facility]  PHQ-2 Total Score 2 3 2 2 3   PHQ-9 Total Score 10 11 10 14 11       Flowsheet Row Admission (Discharged) from 10/12/2022 in [facility] Pre-Admission Testing 43 from 10/07/2022 in [facility] PRE ADMISSION TESTING Counselor from 09/08/2022 in [facility] RISK CATEGORY No Risk No Risk No Risk        Assessment and Plan:  Martha [name] is a 27 y.o. year old female with a history of depression, anxiety, ADHD by history, migraine, GERD, who presents for follow up appointment for below.    1. Mild episode of recurrent major depressive disorder (HCC) 2. Generalized anxiety disorder Acute stressors include: her mother, s/p back surgery, suffering from UTI, s/p surgery for deviated septum Other stressors include:father with emotional abuse, loss of maternal grandmother from COVID, paternal grandmother  with alzheimer, who is in nursing facility     History: admitted once in 2013 for depression   She reports fatigue and anhedonia, which has been slightly worsening, although her anxiety/passive SI  has been improving overall since the last visit.  She agrees to continue current medication regimen given that it can be situational.  It is also difficult to differentiate in the context of steroid use.  Will  continue Pristiq, quetiapine for depression.  Will continue BuSpar for anxiety.   3. Insomnia, unspecified type - reportedly struggles with insomnia since age 7. She had CPAP study titration 07/2022.s/p surgery for deviated septum She restarted using CPAP machine.  Will continue current dose of zaleplon as needed for insomnia.  It was previously discussed that this medication will be tapered off in the future to avoid risk of tolerance and cognitive issues.   4. Attention deficit hyperactivity disorder (ADHD), unspecified ADHD type - neuropsych testing was consistent with ADHD   Stable.  Will continue current dose of Ritalin to target ADHD.     Plan Continue Pristiq 100 mg daily  Continue quetiapine 50 mg at night (on metformin) Continue buspirone 30 mg at night Continue Zaleplon 20 mg at night as needed for sleep Continue Ritalin 10 mg daily - refill left per chart Obtain vitamin D (through her PCP) Next appointment: 5/23 at 4:30 for 30 mins, in person - she sees Ms. Hussami for therapy    Past trials of medication: sertraline, Pristiq, bupropion (some adverse reaction in the past), Abilify (visual change)     I have reviewed suicide assessment in detail. No change in the following assessment.    The patient demonstrates the following risk factors for suicide: Chronic risk factors for suicide include: psychiatric disorder of depression, anxiety. Acute risk factors for suicide include: family or marital conflict. Protective factors for this patient include: positive social support, coping skills and hope for the future. Considering these factors, the overall suicide risk at this point appears to be low. Patient is appropriate for outpatient follow up.    Collaboration of Care: Collaboration of Care: Other reviewed notes in Epic  Patient/Guardian was advised Release of Information must be obtained prior to any record release in order to collaborate their care with an outside provider.  Patient/Guardian was advised if they have not already done so to contact the registration department to sign all necessary forms in order for [place] to release information regarding their care.   Consent: Patient/Guardian gives verbal consent for treatment and assignment of benefits for services provided during this visit. Patient/Guardian expressed understanding and agreed to proceed.    Neysa Hotter, MD 10/20/2022, 5:02 PM

## 2022-10-20 ENCOUNTER — Encounter: Payer: Self-pay | Admitting: Psychiatry

## 2022-10-20 ENCOUNTER — Ambulatory Visit (INDEPENDENT_AMBULATORY_CARE_PROVIDER_SITE_OTHER): Payer: 59 | Admitting: Psychiatry

## 2022-10-20 VITALS — BP 138/95 | HR 101 | Temp 97.6°F | Ht 68.0 in | Wt 340.4 lb

## 2022-10-20 DIAGNOSIS — F411 Generalized anxiety disorder: Secondary | ICD-10-CM

## 2022-10-20 DIAGNOSIS — F330 Major depressive disorder, recurrent, mild: Secondary | ICD-10-CM | POA: Diagnosis not present

## 2022-10-20 DIAGNOSIS — F909 Attention-deficit hyperactivity disorder, unspecified type: Secondary | ICD-10-CM | POA: Diagnosis not present

## 2022-10-20 DIAGNOSIS — G4700 Insomnia, unspecified: Secondary | ICD-10-CM | POA: Diagnosis not present

## 2022-10-20 MED ORDER — BUSPIRONE HCL 30 MG PO TABS: 30.0000 mg | ORAL_TABLET | Freq: Every day | ORAL | 1 refills | Status: DC

## 2022-10-20 MED ORDER — QUETIAPINE FUMARATE 50 MG PO TABS: 50.0000 mg | ORAL_TABLET | Freq: Every day | ORAL | 0 refills | Status: DC

## 2022-10-20 MED ORDER — ZALEPLON 10 MG PO CAPS: 20.0000 mg | ORAL_CAPSULE | Freq: Every day | ORAL | 1 refills | Status: DC

## 2022-10-20 MED ORDER — DESVENLAFAXINE SUCCINATE ER 100 MG PO TB24: 100.0000 mg | ORAL_TABLET | Freq: Every day | ORAL | 1 refills | Status: DC

## 2022-10-20 NOTE — Addendum Note (Signed)
Addended by: Neysa Hotter on: 10/20/2022 05:09 PM   Modules accepted: Orders

## 2022-10-20 NOTE — Anesthesia Postprocedure Evaluation (Signed)
Anesthesia Post Note  Patient: Martha [name]  Procedure(s) Performed: IMAGE GUIDED SINUS SURGERY (Bilateral: Nose) SEPTOPLASTY (Nose) ENDOSCOPIC CONCHA BULLOSA RESECTION (Right) TOTAL ETHMOIDECTOMY (Bilateral)  Patient location during evaluation: PACU Anesthesia Type: General Level of consciousness: awake and alert Pain management: pain level controlled Vital Signs Assessment: post-procedure vital signs reviewed and stable Respiratory status: spontaneous breathing, nonlabored ventilation, respiratory function stable and patient connected to nasal cannula oxygen Cardiovascular status: blood pressure returned to baseline and stable Postop Assessment: no apparent nausea or vomiting Anesthetic complications: no   No notable events documented.   Last Vitals:  Vitals:   10/12/22 1239 10/12/22 1258  BP:  110/64  Pulse:  (!) 106  Resp: 14 14  Temp:  36.6 C  SpO2: 98% 98%    Last Pain:  Vitals:   10/13/22 1422  TempSrc:   PainSc: 0-No pain                 Lenard Simmer

## 2022-10-20 NOTE — Patient Instructions (Addendum)
Continue Pristiq 100 mg daily  Continue quetiapine 50 mg at night (on metformin) Continue buspirone 30 mg at night Continue Zaleplon 20 mg at night as needed for sleep Continue Ritalin 10 mg daily  Obtain lab (vitamin D)  Next appointment: 7/30 at 4:30

## 2022-12-02 ENCOUNTER — Other Ambulatory Visit: Payer: Self-pay | Admitting: Family Medicine

## 2022-12-02 DIAGNOSIS — R519 Headache, unspecified: Secondary | ICD-10-CM

## 2022-12-02 DIAGNOSIS — J3489 Other specified disorders of nose and nasal sinuses: Secondary | ICD-10-CM

## 2022-12-06 ENCOUNTER — Ambulatory Visit (INDEPENDENT_AMBULATORY_CARE_PROVIDER_SITE_OTHER): Payer: Self-pay | Admitting: Licensed Clinical Social Worker

## 2022-12-06 ENCOUNTER — Encounter (HOSPITAL_COMMUNITY): Payer: Self-pay

## 2022-12-06 DIAGNOSIS — Z91199 Patient's noncompliance with other medical treatment and regimen due to unspecified reason: Secondary | ICD-10-CM

## 2022-12-06 NOTE — Progress Notes (Signed)
LCSW counselor attempted to connect with patient for scheduled appointment via MyChart video text request x 2 and email request with no response.   Attempt 1: Text and email: 4:05p  Attempt 2: Text and email: 4:12p   Video session was closed at : 4:16p  Per [facility] policy, after multiple attempts to reach pt unsuccessfully at appointed time--visit will be coded as no show  Pt was provided list of outpatient therapy resources due to provider leaving position in outpatient therapy department.

## 2022-12-06 NOTE — Patient Instructions (Signed)
Outpatient Psychiatry and Counseling  FOR CRISIS:  call 911, [facility]: Mobile Crisis Management 24 hours:  [phone], call [phone], [facility] ([place] [facility]) [address] walk in, or go to your local EMERGENCY DEPARTMENT  [facility] [address] [place], [place] [address]  [phone]  [facility] [address] [place], [place] [address] [phone]  [facility] [employer] sliding scale fee and walk in schedule: M-F 8am-12pm/1pm-3pm [address]  [place], [place] [address] [phone]  [facility] [address] [place], [place] [address] [phone]  [facility]/ Intensive Outpatient Therapy Program/CDIOP/PHP [address] [place], [place] [address] [phone]  [facility], Outpatient Therapy Services, [place] in Services      [phone]     [address]    [place], [place] [address]                 [facility]   [facility] [phone]. [address] [place], [place] [address]  [facility]          [address]  [place], [place] [address]       [phone]  [facility] [address] [place], [place] [address] [phone]  [facility]    [address]    [place], [place] [address]     [phone]       [facility] [address] [place] [address]  [facility]     [address] [place], [place]      [phone]       [facility] Eulogio Ditch, MD [address] [place], [place] [address] [phone]  [facility]     [address]     [place], [place] [address]     [phone]       [facility] [address] [place], [place]  [address] [phone] Resources for Temporary Residential Assistance/Crisis Centers

## 2022-12-17 ENCOUNTER — Ambulatory Visit
Admission: RE | Admit: 2022-12-17 | Discharge: 2022-12-17 | Disposition: A | Discharge status: Home / Self Care | Payer: Managed Care, Other (non HMO) | Source: Ambulatory Visit | Attending: Family Medicine | Admitting: Family Medicine

## 2022-12-17 DIAGNOSIS — J3489 Other specified disorders of nose and nasal sinuses: Secondary | ICD-10-CM

## 2022-12-17 DIAGNOSIS — R519 Headache, unspecified: Secondary | ICD-10-CM

## 2022-12-24 NOTE — Progress Notes (Unsigned)
BH MD/PA/NP OP Progress Note  12/27/2022 5:20 PM Martha [name]  MRN:  [id number]  Chief Complaint:  Chief Complaint  Patient presents with   Follow-up   HPI:  This is a follow-up appointment for depression, anxiety, insomnia, ADHD.  She states that she had a panic attack last Wednesday.  She received the news that her coworker's husband passed away.  She has been stressed at work due to [employer].  She vomited, and likely vomited her medication that morning. She had intense anxiety with crying.  She was unable to see if there, and her mother brought her back home.  She feels better after she slept.  She has panic attacks almost every week for the past month.  She feels restless at times. [place], and denies any anxiety there. The patient has mood symptoms as in PHQ-9/GAD-7. She sleeps fair. Although she reports passive fleeting SI, she denies any plan, intent, and it has been better compared to before.  She thinks her focus has been better lately. She discontinued cetrizine since she had surgery for deviated septum. she agrees with plans as outlined below.    Visit Diagnosis:    ICD-10-CM   1. MDD (major depressive disorder), recurrent episode, mild (HCC)  F33.0     2. Anxiety disorder, unspecified type  F41.9     3. Insomnia, unspecified type  G47.00     4. Attention deficit hyperactivity disorder (ADHD), unspecified ADHD type  F90.9     5. High risk medication use  Z79.899 Urine Drug Panel 7    6. Panic disorder  F41.0       Past Psychiatric History: Please see initial evaluation for full details. I have reviewed the history. No updates at this time.     Past Medical History:  Past Medical History:  Diagnosis Date   ADHD (attention deficit hyperactivity disorder)    no meds taken   Allergy    Anxiety    Complication of anesthesia    woke up during colonoscopy and dental surgery.   Depression    Diabetes mellitus    pre-diabetic,told when  younger   Family history of breast cancer    12/20 cancer genetic testing letter sent   Gastritis    GERD (gastroesophageal reflux disease)    Hashimoto's disease    Headache(784.0)    chronic migraines, decrease since BCP started   History of hiatal hernia    Hypertension    Idiopathic intracranial hypertension    Insomnia    Irregular menstrual cycle    pt states she started BCP,unsure of name to help regulate cycle   Obesity    Sleep apnea    Vision abnormalities    near sighted,wears glasses    Past Surgical History:  Procedure Laterality Date   CHOLECYSTECTOMY     COLONOSCOPY WITH PROPOFOL N/A 12/03/2021   Procedure: COLONOSCOPY WITH PROPOFOL;  Surgeon: Regis Bill, MD;  Location: [facility];  Service: Endoscopy;  Laterality: N/A;   ENDOSCOPIC CONCHA BULLOSA RESECTION Right 10/12/2022   Procedure: ENDOSCOPIC CONCHA BULLOSA RESECTION;  Surgeon: Vernie Murders, MD;  Location: [facility];  Service: ENT;  Laterality: Right;   ESOPHAGOGASTRODUODENOSCOPY (EGD) WITH PROPOFOL N/A 12/03/2021   Procedure: ESOPHAGOGASTRODUODENOSCOPY (EGD) WITH PROPOFOL;  Surgeon: Regis Bill, MD;  Location: [facility];  Service: Endoscopy;  Laterality: N/A;  DM   ETHMOIDECTOMY Bilateral 10/12/2022   Procedure: TOTAL ETHMOIDECTOMY;  Surgeon: Vernie Murders, MD;  Location: [facility];  Service: ENT;  Laterality: Bilateral;   IMAGE GUIDED SINUS SURGERY Bilateral 10/12/2022   Procedure: IMAGE GUIDED SINUS SURGERY;  Surgeon: Vernie Murders, MD;  Location: [facility];  Service: ENT;  Laterality: Bilateral;   SEPTOPLASTY N/A 10/12/2022   Procedure: SEPTOPLASTY;  Surgeon: Vernie Murders, MD;  Location: [facility];  Service: ENT;  Laterality: N/A;   TONSILLECTOMY     at 27yo   UPPER GI ENDOSCOPY  01/2012    Family Psychiatric History: Please see initial evaluation for full details. I have reviewed the history. No updates at this time.     Family History:  Family History  Problem Relation Age of Onset    Thyroid cancer Mother 7   Depression Mother    Hypertension Father    Anxiety disorder Father    Diabetes type II Father    Skin cancer Maternal Grandfather    Alzheimer's disease Maternal Grandfather    Alzheimer's disease Paternal Grandmother    Leukemia Paternal Aunt    Breast cancer Paternal Aunt 40   Hypertension Maternal Grandmother    Diabetes type II Maternal Grandmother     Social History:  Social History   Socioeconomic History   Marital status: Single    Spouse name: Not on file   Number of children: Not on file   Years of education: Not on file   Highest education level: Not on file  Occupational History   Occupation: Consulting civil engineer    Comment: [employer] 12th grade  Tobacco Use   Smoking status: Never   Smokeless tobacco: Never  Vaping Use   Vaping status: Former   Quit date: 10/30/2018  Substance and Sexual Activity   Alcohol use: Yes    Comment: occ   Drug use: No   Sexual activity: Not Currently    Partners: Male    Birth control/protection: Pill  Other Topics Concern   Not on file  Social History Narrative   Not on file   Social Determinants of Health   Financial Resource Strain: Not on file  Food Insecurity: Not on file  Transportation Needs: Not on file  Physical Activity: Inactive (04/07/2017)   Exercise Vital Sign    Days of Exercise per Week: 0 days    Minutes of Exercise per Session: 0 min  Stress: Stress Concern Present (04/07/2017)   [employer] of Occupational Health - Occupational Stress Questionnaire    Feeling of Stress : To some extent  Social Connections: Somewhat Isolated (04/07/2017)   Social Connection and Isolation Panel [NHANES]    Frequency of Communication with Friends and Family: Once a week    Frequency of Social Gatherings with Friends and Family: More than three times a week    Attends Religious Services: Never    Database administrator or Organizations: Yes    Attends Engineer, structural: More than  4 times per year    Marital Status: Never married    Allergies:  Allergies  Allergen Reactions   Doxycycline Other (See Comments)   Lamisil [Terbinafine]    Wound Dressing Adhesive Hives    "I welp up"    Metabolic Disorder Labs: Lab Results  Component Value Date   HGBA1C 6.5 (H) 04/19/2021   No results found for: "PROLACTIN" Lab Results  Component Value Date   CHOL 151 04/20/2020   TRIG 136 04/20/2020   HDL 40 04/20/2020   CHOLHDL 3.8 04/20/2020   LDLCALC 87 04/20/2020   Lab Results  Component Value Date  TSH 6.670 (H) 04/20/2020   TSH 2.405 02/23/2015    Therapeutic Level Labs: No results found for: "LITHIUM" No results found for: "VALPROATE" No results found for: "CBMZ"  Current Medications: Current Outpatient Medications  Medication Sig Dispense Refill   ascorbic acid (VITAMIN C) 1000 MG tablet Take 1,000 mg by mouth daily.     busPIRone (BUSPAR) 30 MG tablet Take 1 tablet (30 mg total) by mouth at bedtime. 90 tablet 1   calcium carbonate (OSCAL) 1500 (600 Ca) MG TABS tablet Take 600 mg of elemental calcium by mouth daily with breakfast.     cephALEXin (KEFLEX) 500 MG capsule Take 1 capsule (500 mg total) by mouth 2 (two) times daily. 14 capsule 0   Cholecalciferol (VITAMIN D-1000 MAX ST) 25 MCG (1000 UT) tablet Take 1,000 Units by mouth daily.     FLUoxetine (PROZAC) 20 MG capsule Take 1 capsule (20 mg total) by mouth daily for 7 days. 7 capsule 0   [START ON 01/03/2023] FLUoxetine (PROZAC) 40 MG capsule Take 1 capsule (40 mg total) by mouth daily. 30 capsule 1   hydrochlorothiazide (MICROZIDE) 12.5 MG capsule Take 12.5 mg by mouth daily.     JANUVIA 100 MG tablet Take by mouth.     levonorgestrel-ethinyl estradiol (INTROVALE) 0.15-0.03 MG tablet Take 1 tablet by mouth daily. (Patient taking differently: Take 1 tablet by mouth daily as needed.) 91 tablet 4   levothyroxine (SYNTHROID) 75 MCG tablet Take 75 mcg by mouth daily before breakfast.     magnesium oxide  (MAG-OX) 400 MG tablet Take 400 mg by mouth daily.     meloxicam (MOBIC) 7.5 MG tablet Take 1-2 tablets by mouth daily as needed.     Multiple Vitamin (MULTI-VITAMIN) tablet Take by mouth.     Multiple Vitamins-Minerals (ZINC PO) Take 1 tablet by mouth daily.     pantoprazole (PROTONIX) 40 MG tablet Take 40 mg by mouth at bedtime.     promethazine (PHENERGAN) 12.5 MG tablet Take 12.5 mg by mouth every 6 (six) hours as needed for nausea or vomiting.     QUEtiapine (SEROQUEL) 50 MG tablet Take 1 tablet (50 mg total) by mouth at bedtime. 90 tablet 0   spironolactone (ALDACTONE) 100 MG tablet Take 200 mg by mouth daily.  9   tizanidine (ZANAFLEX) 2 MG capsule Take 2 mg by mouth at bedtime as needed for muscle spasms.     Ubrogepant (UBRELVY PO) Take 1 tablet by mouth as needed.     vitamin B-12 (CYANOCOBALAMIN) 1000 MCG tablet Take 1,000 mcg by mouth daily.     zaleplon (SONATA) 10 MG capsule Take 2 capsules (20 mg total) by mouth at bedtime. 60 capsule 1   metFORMIN (GLUCOPHAGE) 500 MG tablet Take 250 mg by mouth at bedtime as needed.     methylphenidate (RITALIN) 10 MG tablet Take 1 tablet (10 mg total) by mouth daily before breakfast. 30 tablet 0   No current facility-administered medications for this visit.     Musculoskeletal: Strength & Muscle Tone: within normal limits Gait & Station: normal Patient leans: N/A  Psychiatric Specialty Exam: Review of Systems  Psychiatric/Behavioral:  Positive for decreased concentration, dysphoric mood, sleep disturbance and suicidal ideas. Negative for agitation, behavioral problems, confusion, hallucinations and self-injury. The patient is nervous/anxious. The patient is not hyperactive.   All other systems reviewed and are negative.   Blood pressure 124/82, pulse 91, temperature 98.5 F (36.9 C), temperature source Temporal, height 5\' 8"  (1.727 m), weight Marland Kitchen)  346 lb (156.9 kg), SpO2 97%.Body mass index is 52.61 kg/m.  General Appearance: Fairly  Groomed  Eye Contact:  Good  Speech:  Clear and Coherent  Volume:  Normal  Mood:  Anxious  Affect:  Appropriate, Congruent, and calm  Thought Process:  Coherent  Orientation:  Full (Time, Place, and Person)  Thought Content: Logical   Suicidal Thoughts:  Yes.  without intent/plan  Homicidal Thoughts:  No  Memory:  Immediate;   Good  Judgement:  Good  Insight:  Good  Psychomotor Activity:  Normal  Concentration:  Concentration: Good and Attention Span: Good  Recall:  Good  Fund of Knowledge: Good  Language: Good  Akathisia:  No  Handed:  Right  AIMS (if indicated): not done  Assets:  Communication Skills Desire for Improvement  ADL's:  Intact  Cognition: WNL  Sleep:  Fair   Screenings: GAD-7    Flowsheet Row Office Visit from 12/27/2022 in [facility] Office Visit from 10/20/2022 in [facility] Office Visit from 08/23/2022 in [facility] Office Visit from 06/23/2022 in [facility] Office Visit from 04/14/2022 in [facility]  Total GAD-7 Score 14 7 7 7 7       PHQ2-9    Flowsheet Row Office Visit from 12/27/2022 in [facility] Office Visit from 10/20/2022 in [facility] Office Visit from 08/23/2022 in [facility] Office Visit from 06/23/2022 in [facility] Office Visit from 04/14/2022 in [facility]  PHQ-2 Total Score 4 3 2 3 2   PHQ-9 Total Score 17 10 10 11 10       Flowsheet Row Office Visit from 12/27/2022 in [facility] Office Visit from 10/20/2022 in [facility] Admission (Discharged) from 10/12/2022  in [facility]  C-SSRS RISK CATEGORY Error: Q3, 4, or 5 should not be populated when Q2 is No Error: Q3, 4, or 5 should not be populated when Q2 is No No Risk        Assessment and Plan:  Martha [name] is a 27 y.o. year old female with a history of depression, anxiety, ADHD by history, migraine, GERD, who presents for follow up appointment for below.    1. MDD (major depressive disorder), recurrent episode, mild (HCC) 2. Anxiety disorder, unspecified type # Panic disorder Acute stressors include: her mother, s/p back surgery, s/p surgery for deviated septum, work related stress Other stressors include:father with emotional abuse, loss of maternal grandmother from COVID, paternal grandmother with alzheimer, who is in nursing facility     History: Tx from [facility]. Depression, anxiety, insomnia for many years. admitted once in 2013 for depression. Originally on Pristiq 100 mg daily, buspirone 30 mg qhs, zaleplon 20 mg qhs, Ritalin 10 mg daily  She experienced significant worsening in panic attacks, depressive symptoms since the last visit.  Will cross taper from this desvenlafaxine to fluoxetine to see if it would be more effective for depression, anxiety.  Discussed potential risk of discontinuation symptoms, serotonin syndromes.  Will continue quetiapine at the current dose to target depression, anxiety along with BuSpar for anxiety.    3. Insomnia, unspecified type - reportedly struggles with insomnia since age 37. She had CPAP study titration 07/2022.s/p surgery for deviated septum She is  awaiting appliances for CPAP machine.  Will continue current dose of zaleplon as needed for insomnia at this time.  The plan is to taper off this medication to avoid risk of tolerance.   4. Attention deficit hyperactivity disorder (ADHD), unspecified ADHD type - neuropsych testing was consistent with ADHD   Improving, which coincided with discontinuation of cetrizine.   Will continue current dose of Ritalin to target ADHD.   5. High risk medication use Will obtain UDS given she is on controlled substance.      Plan Decrease Pristiq 50 mg daily for one week, then discontinue Start fluoxetine 20 mg daily for one week, then 40 mg daily  Continue quetiapine 50 mg at night (on metformin) Continue buspirone 30 mg at night Continue Zaleplon 20 mg at night as needed for sleep Continue Ritalin 10 mg daily Obtain UDS at [facility] Next appointment: 9/17 at 4:30, IP Obtain vitamin D (through her PCP)- previously advised - she sees Ms. Hussami for therapy    Past trials of medication: sertraline, Pristiq, bupropion (some adverse reaction in the past), Abilify (visual change)     I have reviewed suicide assessment in detail. No change in the following assessment.    The patient demonstrates the following risk factors for suicide: Chronic risk factors for suicide include: psychiatric disorder of depression, anxiety. Acute risk factors for suicide include: family or marital conflict. Protective factors for this patient include: positive social support, coping skills and hope for the future. Considering these factors, the overall suicide risk at this point appears to be low. Patient is appropriate for outpatient follow up.  Collaboration of Care: Collaboration of Care: Other reviewed notes in Epic  Patient/Guardian was advised Release of Information must be obtained prior to any record release in order to collaborate their care with an outside provider. Patient/Guardian was advised if they have not already done so to contact the registration department to sign all necessary forms in order for [place] to release information regarding their care.   Consent: Patient/Guardian gives verbal consent for treatment and assignment of benefits for services provided during this visit. Patient/Guardian expressed understanding and agreed to proceed.   I have utilized the [employer] Controlled  Substances Reporting System (PMP AWARxE) to confirm adherence regarding the patient's medication. My review reveals appropriate prescription fills.    Neysa Hotter, MD 12/27/2022, 5:20 PM

## 2022-12-27 ENCOUNTER — Encounter: Payer: Self-pay | Admitting: Psychiatry

## 2022-12-27 ENCOUNTER — Ambulatory Visit (INDEPENDENT_AMBULATORY_CARE_PROVIDER_SITE_OTHER): Payer: 59 | Admitting: Psychiatry

## 2022-12-27 VITALS — BP 124/82 | HR 91 | Temp 98.5°F | Ht 68.0 in | Wt 346.0 lb

## 2022-12-27 DIAGNOSIS — F419 Anxiety disorder, unspecified: Secondary | ICD-10-CM | POA: Diagnosis not present

## 2022-12-27 DIAGNOSIS — F330 Major depressive disorder, recurrent, mild: Secondary | ICD-10-CM

## 2022-12-27 DIAGNOSIS — G4700 Insomnia, unspecified: Secondary | ICD-10-CM

## 2022-12-27 DIAGNOSIS — F909 Attention-deficit hyperactivity disorder, unspecified type: Secondary | ICD-10-CM | POA: Diagnosis not present

## 2022-12-27 DIAGNOSIS — F410 Panic disorder [episodic paroxysmal anxiety] without agoraphobia: Secondary | ICD-10-CM

## 2022-12-27 DIAGNOSIS — Z79899 Other long term (current) drug therapy: Secondary | ICD-10-CM

## 2022-12-27 MED ORDER — ZALEPLON 10 MG PO CAPS: 20.0000 mg | ORAL_CAPSULE | Freq: Every day | ORAL | 1 refills | Status: DC

## 2022-12-27 MED ORDER — FLUOXETINE HCL 20 MG PO CAPS: 20.0000 mg | ORAL_CAPSULE | Freq: Every day | ORAL | 0 refills | Status: DC

## 2022-12-27 MED ORDER — METHYLPHENIDATE HCL 10 MG PO TABS: 10.0000 mg | ORAL_TABLET | Freq: Every day | ORAL | 0 refills | Status: DC

## 2022-12-27 MED ORDER — FLUOXETINE HCL 40 MG PO CAPS: 40.0000 mg | ORAL_CAPSULE | Freq: Every day | ORAL | 1 refills | Status: DC

## 2022-12-27 NOTE — Patient Instructions (Signed)
Decrease Pristiq 50 mg daily for one week, then discontinue Start fluoxetine 20 mg daily for one week, then 40 mg daily  Continue quetiapine 50 mg at night  Continue buspirone 30 mg at night Continue Zaleplon 20 mg at night as needed for sleep Continue Ritalin 10 mg daily Obtain UDS Next appointment: 9/17 at 4:30,

## 2023-01-03 ENCOUNTER — Other Ambulatory Visit
Admission: RE | Admit: 2023-01-03 | Discharge: 2023-01-03 | Disposition: A | Discharge status: Home / Self Care | Payer: Managed Care, Other (non HMO) | Attending: Psychiatry | Admitting: Psychiatry

## 2023-01-03 DIAGNOSIS — Z79899 Other long term (current) drug therapy: Secondary | ICD-10-CM | POA: Insufficient documentation

## 2023-01-05 ENCOUNTER — Encounter: Payer: Self-pay | Admitting: Psychiatry

## 2023-01-24 ENCOUNTER — Other Ambulatory Visit: Payer: Self-pay | Admitting: Psychiatry

## 2023-01-24 NOTE — Telephone Encounter (Signed)
Could you contact the pharmacy- she should have enough to last till Dec.   Disp Refills Start End   busPIRone (BUSPAR) 30 MG tablet 90 tablet 1 11/08/2022 05/07/2023  Sig - Route: Take 1 tablet (30 mg total) by mouth at bedtime. - Oral  Sent to pharmacy as: busPIRone (BUSPAR) 30 MG tablet  Notes to Pharmacy: This medication is scheduled to be started on 6/11. Please refrain from filling it until one week prior to avoid accumulating excess medication.  E-Prescribing Status: Receipt confirmed by pharmacy (10/20/2022  6:23 PM EDT)

## 2023-01-25 ENCOUNTER — Other Ambulatory Visit: Payer: Self-pay | Admitting: Psychiatry

## 2023-01-27 NOTE — Telephone Encounter (Signed)
According to the nurse who spoke with the pharmacy,  the patient picked up the Buspar on 01/23/23 it was only a 30 day supply emily stated that she is not sure why she did not get the 90 day supply it may be due to insurance but the patient does have refills on file.

## 2023-02-10 NOTE — Progress Notes (Deleted)
BH MD/PA/NP OP Progress Note  02/10/2023 1:41 PM LOVIE DESCH  MRN:  [id number]  Chief Complaint: No chief complaint on file.  HPI: *** Visit Diagnosis: No diagnosis found.  Past Psychiatric History: Please see initial evaluation for full details. I have reviewed the history. No updates at this time.     Past Medical History:  Past Medical History:  Diagnosis Date   ADHD (attention deficit hyperactivity disorder)    no meds taken   Allergy    Anxiety    Complication of anesthesia    woke up during colonoscopy and dental surgery.   Depression    Diabetes mellitus    pre-diabetic,told when younger   Family history of breast cancer    12/20 cancer genetic testing letter sent   Gastritis    GERD (gastroesophageal reflux disease)    Hashimoto's disease    Headache(784.0)    chronic migraines, decrease since BCP started   History of hiatal hernia    Hypertension    Idiopathic intracranial hypertension    Insomnia    Irregular menstrual cycle    pt states she started BCP,unsure of name to help regulate cycle   Obesity    Sleep apnea    Vision abnormalities    near sighted,wears glasses    Past Surgical History:  Procedure Laterality Date   CHOLECYSTECTOMY     COLONOSCOPY WITH PROPOFOL N/A 12/03/2021   Procedure: COLONOSCOPY WITH PROPOFOL;  Surgeon: Regis Bill, MD;  Location: [facility];  Service: Endoscopy;  Laterality: N/A;   ENDOSCOPIC CONCHA BULLOSA RESECTION Right 10/12/2022   Procedure: ENDOSCOPIC CONCHA BULLOSA RESECTION;  Surgeon: Vernie Murders, MD;  Location: [facility];  Service: ENT;  Laterality: Right;   ESOPHAGOGASTRODUODENOSCOPY (EGD) WITH PROPOFOL N/A 12/03/2021   Procedure: ESOPHAGOGASTRODUODENOSCOPY (EGD) WITH PROPOFOL;  Surgeon: Regis Bill, MD;  Location: [facility];  Service: Endoscopy;  Laterality: N/A;  DM   ETHMOIDECTOMY Bilateral 10/12/2022   Procedure: TOTAL ETHMOIDECTOMY;  Surgeon: Vernie Murders, MD;  Location: [facility];   Service: ENT;  Laterality: Bilateral;   IMAGE GUIDED SINUS SURGERY Bilateral 10/12/2022   Procedure: IMAGE GUIDED SINUS SURGERY;  Surgeon: Vernie Murders, MD;  Location: [facility];  Service: ENT;  Laterality: Bilateral;   SEPTOPLASTY N/A 10/12/2022   Procedure: SEPTOPLASTY;  Surgeon: Vernie Murders, MD;  Location: [facility];  Service: ENT;  Laterality: N/A;   TONSILLECTOMY     at 27yo   UPPER GI ENDOSCOPY  01/2012    Family Psychiatric History: Please see initial evaluation for full details. I have reviewed the history. No updates at this time.     Family History:  Family History  Problem Relation Age of Onset   Thyroid cancer Mother 16   Depression Mother    Hypertension Father    Anxiety disorder Father    Diabetes type II Father    Skin cancer Maternal Grandfather    Alzheimer's disease Maternal Grandfather    Alzheimer's disease Paternal Grandmother    Leukemia Paternal Aunt    Breast cancer Paternal Aunt 40   Hypertension Maternal Grandmother    Diabetes type II Maternal Grandmother     Social History:  Social History   Socioeconomic History   Marital status: Single    Spouse name: Not on file   Number of children: Not on file   Years of education: Not on file   Highest education level: Not on file  Occupational History   Occupation: Consulting civil engineer    Comment: [employer]  12th grade  Tobacco Use   Smoking status: Never   Smokeless tobacco: Never  Vaping Use   Vaping status: Former   Quit date: 10/30/2018  Substance and Sexual Activity   Alcohol use: Yes    Comment: occ   Drug use: No   Sexual activity: Not Currently    Partners: Male    Birth control/protection: Pill  Other Topics Concern   Not on file  Social History Narrative   Not on file   Social Determinants of Health   Financial Resource Strain: Not on file  Food Insecurity: Not on file  Transportation Needs: Not on file  Physical Activity: Inactive (04/07/2017)   Exercise Vital Sign    Days of  Exercise per Week: 0 days    Minutes of Exercise per Session: 0 min  Stress: Stress Concern Present (04/07/2017)   [employer] of Occupational Health - Occupational Stress Questionnaire    Feeling of Stress : To some extent  Social Connections: Somewhat Isolated (04/07/2017)   Social Connection and Isolation Panel [NHANES]    Frequency of Communication with Friends and Family: Once a week    Frequency of Social Gatherings with Friends and Family: More than three times a week    Attends Religious Services: Never    Database administrator or Organizations: Yes    Attends Engineer, structural: More than 4 times per year    Marital Status: Never married    Allergies:  Allergies  Allergen Reactions   Doxycycline Other (See Comments)   Lamisil [Terbinafine]    Wound Dressing Adhesive Hives    "I welp up"    Metabolic Disorder Labs: Lab Results  Component Value Date   HGBA1C 6.5 (H) 04/19/2021   No results found for: "PROLACTIN" Lab Results  Component Value Date   CHOL 151 04/20/2020   TRIG 136 04/20/2020   HDL 40 04/20/2020   CHOLHDL 3.8 04/20/2020   LDLCALC 87 04/20/2020   Lab Results  Component Value Date   TSH 6.670 (H) 04/20/2020   TSH 2.405 02/23/2015    Therapeutic Level Labs: No results found for: "LITHIUM" No results found for: "VALPROATE" No results found for: "CBMZ"  Current Medications: Current Outpatient Medications  Medication Sig Dispense Refill   ascorbic acid (VITAMIN C) 1000 MG tablet Take 1,000 mg by mouth daily.     busPIRone (BUSPAR) 30 MG tablet Take 1 tablet (30 mg total) by mouth at bedtime. 90 tablet 1   calcium carbonate (OSCAL) 1500 (600 Ca) MG TABS tablet Take 600 mg of elemental calcium by mouth daily with breakfast.     cephALEXin (KEFLEX) 500 MG capsule Take 1 capsule (500 mg total) by mouth 2 (two) times daily. 14 capsule 0   Cholecalciferol (VITAMIN D-1000 MAX ST) 25 MCG (1000 UT) tablet Take 1,000 Units by mouth daily.      FLUoxetine (PROZAC) 20 MG capsule Take 1 capsule (20 mg total) by mouth daily for 7 days. 7 capsule 0   FLUoxetine (PROZAC) 40 MG capsule Take 1 capsule (40 mg total) by mouth daily. 30 capsule 1   hydrochlorothiazide (MICROZIDE) 12.5 MG capsule Take 12.5 mg by mouth daily.     JANUVIA 100 MG tablet Take by mouth.     levonorgestrel-ethinyl estradiol (INTROVALE) 0.15-0.03 MG tablet Take 1 tablet by mouth daily. (Patient taking differently: Take 1 tablet by mouth daily as needed.) 91 tablet 4   levothyroxine (SYNTHROID) 75 MCG tablet Take 75 mcg by mouth daily  before breakfast.     magnesium oxide (MAG-OX) 400 MG tablet Take 400 mg by mouth daily.     meloxicam (MOBIC) 7.5 MG tablet Take 1-2 tablets by mouth daily as needed.     metFORMIN (GLUCOPHAGE) 500 MG tablet Take 250 mg by mouth at bedtime as needed.     methylphenidate (RITALIN) 10 MG tablet Take 1 tablet (10 mg total) by mouth daily before breakfast. 30 tablet 0   [START ON 02/12/2023] methylphenidate (RITALIN) 10 MG tablet Take 1 tablet (10 mg total) by mouth daily. 30 tablet 0   Multiple Vitamin (MULTI-VITAMIN) tablet Take by mouth.     Multiple Vitamins-Minerals (ZINC PO) Take 1 tablet by mouth daily.     pantoprazole (PROTONIX) 40 MG tablet Take 40 mg by mouth at bedtime.     promethazine (PHENERGAN) 12.5 MG tablet Take 12.5 mg by mouth every 6 (six) hours as needed for nausea or vomiting.     QUEtiapine (SEROQUEL) 50 MG tablet Take 1 tablet (50 mg total) by mouth at bedtime. 90 tablet 0   spironolactone (ALDACTONE) 100 MG tablet Take 200 mg by mouth daily.  9   tizanidine (ZANAFLEX) 2 MG capsule Take 2 mg by mouth at bedtime as needed for muscle spasms.     Ubrogepant (UBRELVY PO) Take 1 tablet by mouth as needed.     vitamin B-12 (CYANOCOBALAMIN) 1000 MCG tablet Take 1,000 mcg by mouth daily.     zaleplon (SONATA) 10 MG capsule Take 2 capsules (20 mg total) by mouth at bedtime. 60 capsule 1   No current facility-administered  medications for this visit.     Musculoskeletal: Strength & Muscle Tone: within normal limits Gait & Station: normal Patient leans: N/A  Psychiatric Specialty Exam: Review of Systems  There were no vitals taken for this visit.There is no height or weight on file to calculate BMI.  General Appearance: {Appearance:22683}  Eye Contact:  {B[facility] EYE CONTACT:22684}  Speech:  Clear and Coherent  Volume:  Normal  Mood:  {BHH MOOD:22306}  Affect:  {Affect (PAA):22687}  Thought Process:  Coherent  Orientation:  Full (Time, Place, and Person)  Thought Content: Logical   Suicidal Thoughts:  {ST/HT (PAA):22692}  Homicidal Thoughts:  {ST/HT (PAA):22692}  Memory:  Immediate;   Good  Judgement:  {Judgement (PAA):22694}  Insight:  {Insight (PAA):22695}  Psychomotor Activity:  Normal  Concentration:  Concentration: Good and Attention Span: Good  Recall:  Good  Fund of Knowledge: Good  Language: Good  Akathisia:  No  Handed:  Right  AIMS (if indicated): not done  Assets:  Communication Skills Desire for Improvement  ADL's:  Intact  Cognition: WNL  Sleep:  {B[facility] GOOD/FAIR/POOR:22877}   Screenings: GAD-7    Garment/textile technologist Visit from 12/27/2022 in [facility] Office Visit from 10/20/2022 in [facility] Office Visit from 08/23/2022 in [facility] Office Visit from 06[facility]23/2022 in [facility] Office Visit from 04/14/2022 in [facility]  Total GAD-7 Score 14 7 7 7 7       PHQ2-9    Flowsheet Row Office Visit from 12/27/2022 in [facility] Office Visit from 10/20/2022 in [facility] Office Visit from 08/23/2022 in [facility] Office Visit from 06/23/2022 in [facility] Office Visit from 04/14/2022 in [facility]  PHQ-2 Total Score 4 3 2 3 2   PHQ-9 Total Score 17 10 10 11 10       Flowsheet Row Office Visit from 12/27/2022 in [facility] Office Visit from 10/20/2022 in [facility] Admission (Discharged) from 10/12/2022 in [facility]  C-SSRS RISK CATEGORY Error: Q3, 4, or 5 should not be populated when Q2 is No Error: Q3, 4, or 5 should not be populated when Q2 is No No Risk        Assessment and Plan:  ARSIE PARADOWSKI is a 27 y.o. year old female with a history of depression, anxiety, ADHD by history, migraine, GERD, who presents for follow up appointment for below.     1. MDD (major depressive disorder), recurrent episode, mild (HCC) 2. Anxiety disorder, unspecified type # Panic disorder Acute stressors include: her mother, s/p back surgery, s/p surgery for deviated septum, work related stress Other stressors include:father with emotional abuse, loss of maternal grandmother from COVID, paternal grandmother with alzheimer, who is in nursing facility     History: Tx from [facility]. Depression, anxiety, insomnia for many years. admitted once in 2013 for depression. Originally on Pristiq 100 mg daily, buspirone 30 mg qhs, zaleplon 20 mg qhs, Ritalin 10 mg daily  She experienced significant worsening in panic attacks, depressive symptoms since the last visit.  Will cross taper from this desvenlafaxine to fluoxetine to see if it would be more effective for depression, anxiety.  Discussed potential risk of discontinuation symptoms, serotonin syndromes.  Will continue quetiapine at the current dose to target depression, anxiety along with BuSpar for anxiety.    3. Insomnia, unspecified type - reportedly struggles with insomnia since age 37. She had CPAP study titration 07/2022.s/p  surgery for deviated septum She is awaiting appliances for CPAP machine.  Will continue current dose of zaleplon as needed for insomnia at this time.  The plan is to taper off this medication to avoid risk of tolerance.    4. Attention deficit hyperactivity disorder (ADHD), unspecified ADHD type - neuropsych testing was consistent with ADHD   Improving, which coincided with discontinuation of cetrizine.  Will continue current dose of Ritalin to target ADHD.    5. High risk medication use Will obtain UDS given she is on controlled substance.      Plan Decrease Pristiq 50 mg daily for one week, then discontinue Start fluoxetine 20 mg daily for one week, then 40 mg daily  Continue quetiapine 50 mg at night (on metformin)  EKG QTc 412 msec, 12/2022 Continue buspirone 30 mg at night Continue Zaleplon 20 mg at night as needed for sleep Continue Ritalin 10 mg daily Obtain UDS at [facility] Next appointment: 9/17 at 4:30, IP Obtain vitamin D (through her PCP)- previously advised - she sees Ms. Hussami for therapy    Past trials of medication: sertraline, Pristiq, bupropion (some adverse reaction in the past), Abilify (visual change)     I have reviewed suicide assessment in detail. No change in the following assessment.    The patient demonstrates the following risk factors for suicide: Chronic risk factors for suicide include: psychiatric disorder of depression, anxiety. Acute risk factors for suicide include: family or marital conflict. Protective factors for this patient include: positive social support, coping skills and hope for the future. Considering these factors, the overall suicide risk at this point appears to be low. Patient is appropriate for outpatient follow up.  Collaboration of Care: Collaboration of Care: {BH OP Collaboration of Care:21014065}  Patient/Guardian was advised Release of Information must be obtained prior to any record release in order to collaborate their care with  an outside provider. Patient/Guardian was advised if they have not already done so to contact the registration department to sign all necessary forms in order for [place] to release information regarding their care.   Consent: Patient/Guardian gives verbal consent for treatment and assignment of benefits for services provided during this visit. Patient/Guardian expressed understanding and agreed to proceed.    Neysa Hotter, MD 02/10/2023, 1:41 PM

## 2023-02-14 ENCOUNTER — Ambulatory Visit: Payer: 59 | Admitting: Psychiatry

## 2023-02-21 ENCOUNTER — Telehealth: Payer: Self-pay

## 2023-02-21 ENCOUNTER — Other Ambulatory Visit: Payer: Self-pay | Admitting: Psychiatry

## 2023-02-21 DIAGNOSIS — F909 Attention-deficit hyperactivity disorder, unspecified type: Secondary | ICD-10-CM

## 2023-02-21 DIAGNOSIS — F330 Major depressive disorder, recurrent, mild: Secondary | ICD-10-CM

## 2023-02-21 DIAGNOSIS — G4700 Insomnia, unspecified: Secondary | ICD-10-CM

## 2023-02-21 MED ORDER — ZALEPLON 10 MG PO CAPS
20.0000 mg | ORAL_CAPSULE | Freq: Every day | ORAL | 0 refills | Status: AC
Start: 2023-02-21 — End: 2023-03-23

## 2023-02-21 MED ORDER — FLUOXETINE HCL 40 MG PO CAPS
40.0000 mg | ORAL_CAPSULE | Freq: Every day | ORAL | 0 refills | Status: AC
Start: 2023-02-21 — End: 2023-03-23

## 2023-02-21 NOTE — Telephone Encounter (Signed)
I have sent Prozac, zaleplon to pharmacy.  Dr. Vanetta Shawl has already sent out methylphenidate prescription which is waiting for her at the pharmacy.

## 2023-02-21 NOTE — Telephone Encounter (Signed)
pt called left message that she needs refill on the prozac, ritalin and the zaleplon. she states she will not have enough to get to her next appt. pt was last seen on 7-30 next appt 11-11

## 2023-02-22 NOTE — Telephone Encounter (Signed)
left message that rxs' were sent to the pharmacy

## 2023-02-27 ENCOUNTER — Encounter: Payer: Self-pay | Admitting: Psychiatry

## 2023-02-27 ENCOUNTER — Ambulatory Visit (INDEPENDENT_AMBULATORY_CARE_PROVIDER_SITE_OTHER): Payer: 59 | Admitting: Psychiatry

## 2023-02-27 ENCOUNTER — Telehealth: Payer: Self-pay

## 2023-02-27 VITALS — BP 136/86 | HR 106 | Temp 98.3°F | Ht 68.0 in | Wt 326.8 lb

## 2023-02-27 DIAGNOSIS — F909 Attention-deficit hyperactivity disorder, unspecified type: Secondary | ICD-10-CM

## 2023-02-27 DIAGNOSIS — E559 Vitamin D deficiency, unspecified: Secondary | ICD-10-CM

## 2023-02-27 DIAGNOSIS — F331 Major depressive disorder, recurrent, moderate: Secondary | ICD-10-CM | POA: Diagnosis not present

## 2023-02-27 DIAGNOSIS — Z1329 Encounter for screening for other suspected endocrine disorder: Secondary | ICD-10-CM

## 2023-02-27 DIAGNOSIS — G4700 Insomnia, unspecified: Secondary | ICD-10-CM

## 2023-02-27 MED ORDER — ZALEPLON 10 MG PO CAPS
20.0000 mg | ORAL_CAPSULE | Freq: Every day | ORAL | 0 refills | Status: DC
Start: 2023-03-25 — End: 2023-04-10

## 2023-02-27 MED ORDER — FLUOXETINE HCL 40 MG PO CAPS
40.0000 mg | ORAL_CAPSULE | Freq: Every day | ORAL | 0 refills | Status: DC
Start: 2023-03-23 — End: 2023-06-08

## 2023-02-27 MED ORDER — FLUOXETINE HCL 20 MG PO CAPS: 20.0000 mg | ORAL_CAPSULE | Freq: Every day | ORAL | 1 refills | Status: DC

## 2023-02-27 MED ORDER — METHYLPHENIDATE HCL 10 MG PO TABS: 10.0000 mg | ORAL_TABLET | Freq: Every day | ORAL | 0 refills | Status: DC

## 2023-02-27 NOTE — Progress Notes (Signed)
BH MD/PA/NP OP Progress Note  02/27/2023 1:54 PM Martha [name]  MRN:  [id number]  Chief Complaint:  Chief Complaint  Patient presents with   Follow-up   HPI:  This is a follow-up appointment for depression, anxiety and insomnia.  She states that she woke up with pain this morning, and had worsening in depression.  Although she denies SI, she had a thought of biting her nails, and pulling her hairs.  She did not act on these, and did water color this morning.  She has been stressed lately.  There is short staffed at the clinic, and she has not received good support.  She has a low back home today as she was not feeling good.  She knows that her communication with patient would not be good.  She is undergoing evaluation of abnormal uterine bleeding.  She is concerned her family history of endometriosis, and is also concerned about cancer.  There was a fall festival for church.  Her mother had broken down in relation to this event. The patient has mood symptoms as in PHQ-9/GAD-7.  She has appetite loss, and eats granola bar only. She drinks mount dew almost every day, and she knows it is not good for her body, and has been refrain from it.  She has not noticed much difference since switching from Pristiq to fluoxetine.  She is willing to try higher dose at this point.    Wt Readings from Last 3 Encounters:  02/27/23 (!) 326 lb 12.8 oz (148.2 kg)  12/27/22 (!) 346 lb (156.9 kg)  10/20/22 (!) 340 lb 6.4 oz (154.4 kg)     Visit Diagnosis:    ICD-10-CM   1. MDD (major depressive disorder), recurrent episode, moderate (HCC)  F33.1 FLUoxetine (PROZAC) 40 MG capsule    Ambulatory referral to Psychology    2. Insomnia, unspecified type  G47.00 zaleplon (SONATA) 10 MG capsule    3. Attention deficit hyperactivity disorder (ADHD), unspecified ADHD type  F90.9     4. Vitamin D deficiency  E55.9 VITAMIN D 25 Hydroxy (Vit-D Deficiency, Fractures)    5. Screening for thyroid disorder  Z13.29 TSH     T4, free      Past Psychiatric History: Please see initial evaluation for full details. I have reviewed the history. No updates at this time.     Past Medical History:  Past Medical History:  Diagnosis Date   ADHD (attention deficit hyperactivity disorder)    no meds taken   Allergy    Anxiety    Complication of anesthesia    woke up during colonoscopy and dental surgery.   Depression    Diabetes mellitus    pre-diabetic,told when younger   Family history of breast cancer    12/20 cancer genetic testing letter sent   Gastritis    GERD (gastroesophageal reflux disease)    Hashimoto's disease    Headache(784.0)    chronic migraines, decrease since BCP started   History of hiatal hernia    Hypertension    Idiopathic intracranial hypertension    Insomnia    Irregular menstrual cycle    pt states she started BCP,unsure of name to help regulate cycle   Obesity    Sleep apnea    Vision abnormalities    near sighted,wears glasses    Past Surgical History:  Procedure Laterality Date   CHOLECYSTECTOMY     COLONOSCOPY WITH PROPOFOL N/A 12/03/2021   Procedure: COLONOSCOPY WITH PROPOFOL;  Surgeon: Regis Bill,  MD;  Location: [facility];  Service: Endoscopy;  Laterality: N/A;   ENDOSCOPIC CONCHA BULLOSA RESECTION Right 10/12/2022   Procedure: ENDOSCOPIC CONCHA BULLOSA RESECTION;  Surgeon: Vernie Murders, MD;  Location: [facility];  Service: ENT;  Laterality: Right;   ESOPHAGOGASTRODUODENOSCOPY (EGD) WITH PROPOFOL N/A 12/03/2021   Procedure: ESOPHAGOGASTRODUODENOSCOPY (EGD) WITH PROPOFOL;  Surgeon: Regis Bill, MD;  Location: [facility];  Service: Endoscopy;  Laterality: N/A;  DM   ETHMOIDECTOMY Bilateral 10/12/2022   Procedure: TOTAL ETHMOIDECTOMY;  Surgeon: Vernie Murders, MD;  Location: [facility];  Service: ENT;  Laterality: Bilateral;   IMAGE GUIDED SINUS SURGERY Bilateral 10/12/2022   Procedure: IMAGE GUIDED SINUS SURGERY;  Surgeon: Vernie Murders, MD;  Location:  [facility];  Service: ENT;  Laterality: Bilateral;   SEPTOPLASTY N/A 10/12/2022   Procedure: SEPTOPLASTY;  Surgeon: Vernie Murders, MD;  Location: [facility];  Service: ENT;  Laterality: N/A;   TONSILLECTOMY     at 27yo   UPPER GI ENDOSCOPY  01/2012    Family Psychiatric History: Please see initial evaluation for full details. I have reviewed the history. No updates at this time.     Family History:  Family History  Problem Relation Age of Onset   Thyroid cancer Mother 50   Depression Mother    Hypertension Father    Anxiety disorder Father    Diabetes type II Father    Skin cancer Maternal Grandfather    Alzheimer's disease Maternal Grandfather    Alzheimer's disease Paternal Grandmother    Leukemia Paternal Aunt    Breast cancer Paternal Aunt 40   Hypertension Maternal Grandmother    Diabetes type II Maternal Grandmother     Social History:  Social History   Socioeconomic History   Marital status: Single    Spouse name: Not on file   Number of children: Not on file   Years of education: Not on file   Highest education level: Not on file  Occupational History   Occupation: Consulting civil engineer    Comment: [employer] 12th grade  Tobacco Use   Smoking status: Never   Smokeless tobacco: Never  Vaping Use   Vaping status: Former   Quit date: 10/30/2018  Substance and Sexual Activity   Alcohol use: Yes    Comment: occ   Drug use: No   Sexual activity: Not Currently    Partners: Male    Birth control/protection: Pill  Other Topics Concern   Not on file  Social History Narrative   Not on file   Social Determinants of Health   Financial Resource Strain: Not on file  Food Insecurity: Not on file  Transportation Needs: Not on file  Physical Activity: Inactive (04/07/2017)   Exercise Vital Sign    Days of Exercise per Week: 0 days    Minutes of Exercise per Session: 0 min  Stress: Stress Concern Present (04/07/2017)   [employer] of Occupational Health - Occupational  Stress Questionnaire    Feeling of Stress : To some extent  Social Connections: Somewhat Isolated (04/07/2017)   Social Connection and Isolation Panel [NHANES]    Frequency of Communication with Friends and Family: Once a week    Frequency of Social Gatherings with Friends and Family: More than three times a week    Attends Religious Services: Never    Database administrator or Organizations: Yes    Attends Engineer, structural: More than 4 times per year    Marital Status: Never married    Allergies:  Allergies  Allergen Reactions   Doxycycline Other (See Comments)   Lamisil [Terbinafine]    Wound Dressing Adhesive Hives    "I welp up"    Metabolic Disorder Labs: Lab Results  Component Value Date   HGBA1C 6.5 (H) 04/19/2021   No results found for: "PROLACTIN" Lab Results  Component Value Date   CHOL 151 04/20/2020   TRIG 136 04/20/2020   HDL 40 04/20/2020   CHOLHDL 3.8 04/20/2020   LDLCALC 87 04/20/2020   Lab Results  Component Value Date   TSH 6.670 (H) 04/20/2020   TSH 2.405 02/23/2015    Therapeutic Level Labs: No results found for: "LITHIUM" No results found for: "VALPROATE" No results found for: "CBMZ"  Current Medications: Current Outpatient Medications  Medication Sig Dispense Refill   ascorbic acid (VITAMIN C) 1000 MG tablet Take 1,000 mg by mouth daily.     busPIRone (BUSPAR) 30 MG tablet Take 1 tablet (30 mg total) by mouth at bedtime. 90 tablet 1   calcium carbonate (OSCAL) 1500 (600 Ca) MG TABS tablet Take 600 mg of elemental calcium by mouth daily with breakfast.     cephALEXin (KEFLEX) 500 MG capsule Take 1 capsule (500 mg total) by mouth 2 (two) times daily. 14 capsule 0   Cholecalciferol (VITAMIN D-1000 MAX ST) 25 MCG (1000 UT) tablet Take 1,000 Units by mouth daily.     hydrochlorothiazide (MICROZIDE) 12.5 MG capsule Take 12.5 mg by mouth daily.     JANUVIA 100 MG tablet Take by mouth.     levonorgestrel-ethinyl estradiol (INTROVALE)  0.15-0.03 MG tablet Take 1 tablet by mouth daily. (Patient taking differently: Take 1 tablet by mouth daily as needed.) 91 tablet 4   levothyroxine (SYNTHROID) 75 MCG tablet Take 75 mcg by mouth daily before breakfast.     magnesium oxide (MAG-OX) 400 MG tablet Take 400 mg by mouth daily.     meloxicam (MOBIC) 7.5 MG tablet Take 1-2 tablets by mouth daily as needed.     methylphenidate (RITALIN) 10 MG tablet Take 1 tablet (10 mg total) by mouth daily. 30 tablet 0   Multiple Vitamin (MULTI-VITAMIN) tablet Take by mouth.     Multiple Vitamins-Minerals (ZINC PO) Take 1 tablet by mouth daily.     pantoprazole (PROTONIX) 40 MG tablet Take 40 mg by mouth at bedtime.     promethazine (PHENERGAN) 12.5 MG tablet Take 12.5 mg by mouth every 6 (six) hours as needed for nausea or vomiting.     QUEtiapine (SEROQUEL) 50 MG tablet TAKE 1 TABLET(50 MG) BY MOUTH AT BEDTIME 90 tablet 0   spironolactone (ALDACTONE) 100 MG tablet Take 200 mg by mouth daily.  9   tizanidine (ZANAFLEX) 2 MG capsule Take 2 mg by mouth at bedtime as needed for muscle spasms.     Ubrogepant (UBRELVY PO) Take 1 tablet by mouth as needed.     vitamin B-12 (CYANOCOBALAMIN) 1000 MCG tablet Take 1,000 mcg by mouth daily.     FLUoxetine (PROZAC) 20 MG capsule Take 1 capsule (20 mg total) by mouth daily. Total of 60 mg daily. Take along with 40 mg cap 30 capsule 1   [START ON 03/23/2023] FLUoxetine (PROZAC) 40 MG capsule Take 1 capsule (40 mg total) by mouth daily. Total of 60 mg daily. Take along with 20 mg cap 90 capsule 0   metFORMIN (GLUCOPHAGE) 500 MG tablet Take 250 mg by mouth at bedtime as needed.     [START ON 03/29/2023] methylphenidate (RITALIN) 10 MG  tablet Take 1 tablet (10 mg total) by mouth daily before breakfast. 30 tablet 0   [START ON 03/25/2023] zaleplon (SONATA) 10 MG capsule Take 2 capsules (20 mg total) by mouth at bedtime. 60 capsule 0   No current facility-administered medications for this visit.      Musculoskeletal: Strength & Muscle Tone: within normal limits Gait & Station: normal Patient leans: N/A  Psychiatric Specialty Exam: Review of Systems  Psychiatric/Behavioral:  Positive for dysphoric mood and sleep disturbance. Negative for agitation, behavioral problems, confusion, decreased concentration, hallucinations, self-injury and suicidal ideas. The patient is nervous/anxious. The patient is not hyperactive.   All other systems reviewed and are negative.   Blood pressure 136/86, pulse (!) 106, temperature 98.3 F (36.8 C), temperature source Temporal, height 5\' 8"  (1.727 m), weight (!) 326 lb 12.8 oz (148.2 kg), SpO2 94%.Body mass index is 49.69 kg/m.  General Appearance: Well Groomed  Eye Contact:  Good  Speech:  Clear and Coherent  Volume:  Normal  Mood:  Depressed  Affect:  Appropriate, Congruent, and Tearful  Thought Process:  Coherent  Orientation:  Full (Time, Place, and Person)  Thought Content: Logical   Suicidal Thoughts:  No  Homicidal Thoughts:  No  Memory:  Immediate;   Good  Judgement:  Good  Insight:  Good  Psychomotor Activity:  Normal  Concentration:  Concentration: Good and Attention Span: Good  Recall:  Good  Fund of Knowledge: Good  Language: Good  Akathisia:  No  Handed:  Right  AIMS (if indicated): not done  Assets:  Communication Skills Desire for Improvement  ADL's:  Intact  Cognition: WNL  Sleep:  Poor   Screenings: GAD-7    Flowsheet Row Office Visit from 02/27/2023 in [facility] Office Visit from 12/27/2022 in [facility] Office Visit from 10/20/2022 in [facility] Office Visit from 08/23/2022 in [facility] Office Visit from 06/23/2022 in [facility]  Total GAD-7 Score 14 14 7 7 7       PHQ2-9    Flowsheet Row Office Visit  from 02/27/2023 in [facility] Office Visit from 12[facility]27/2022 in [facility] Office Visit from 10/20/2022 in [facility] Office Visit from 08/23/2022 in [facility] Office Visit from 06/23/2022 in [facility]  PHQ-2 Total Score 4 4 3 2 3   PHQ-9 Total Score 17 17 10 10 11       Flowsheet Row Office Visit from 02/27/2023 in [facility] Office Visit from 12/27/2022 in [facility] Office Visit from 10/20/2022 in [facility]  C-SSRS RISK CATEGORY No Risk Error: Q3, 4, or 5 should not be populated when Q2 is No Error: Q3, 4, or 5 should not be populated when Q2 is No        Assessment and Plan:  Martha [name] is a 27 y.o. year old female with a history of depression, anxiety, ADHD by history, migraine, GERD, who presents for follow up appointment for below.    1. MDD (major depressive disorder), recurrent episode, moderate (HCC) 2. Anxiety disorder, unspecified type # Panic disorder Acute stressors include: her mother, s/p back surgery, s/p surgery for deviated septum, work related stress, undergoing evaluation of abnormal uterine bleeding Other stressors include:father with  emotional abuse, loss of maternal grandmother from COVID, paternal grandmother with alzheimer, who is in nursing facility     History: Tx from [facility]. Depression, anxiety, insomnia for many years. admitted once in 2013 for depression. Originally on Pristiq 100 mg daily, buspirone 30 mg qhs, zaleplon 20 mg qhs, Ritalin 10 mg daily  There has been significant worsening in depressive symptoms especially over the past week in the context of stressors as above.  She denies any concern since switching from desvenlafaxine  to fluoxetine.  Will do further uptitration of fluoxetine to optimize treatment for depression, anxiety.  Will continue current dose of quetiapine as adjunctive treatment for depression and anxiety.  Will continue BuSpar for anxiety.   2. Insomnia, unspecified type - reportedly struggles with insomnia since age 52. She had CPAP study titration 07/2022.s/p surgery for deviated septum Significant worsening.  Will continue current dose of zaleplon at this time with the above intervention.   3. Attention deficit hyperactivity disorder (ADHD), unspecified ADHD type - neuropsych testing was consistent with ADHD. UDS negative 12/2022   Overall stable.  Will continue current dose of Ritalin to target ADHD.   4. Vitamin D deficiency We will recheck the level given her comorbidity of depression.   5. Screening for thyroid disorder Will obtain labs to rule out medical health contributing to her mood symptoms.        Plan Increase fluoxetine 60 mg daily  Continue quetiapine 50 mg at night (on metformin) Continue buspirone 30 mg at night Continue Zaleplon 20 mg at night as needed for sleep Continue Ritalin 10 mg daily Obtain lab (thyroid panel, vitamin D) Next appointment: 11/18 at 8 am, IP Referral for therapy, [facility] - A letter was provided as proof of the visit today.  Past trials of medication: sertraline, Pristiq, bupropion (some adverse reaction in the past), Abilify (visual change)     I have reviewed suicide assessment in detail. No change in the following assessment.    The patient demonstrates the following risk factors for suicide: Chronic risk factors for suicide include: psychiatric disorder of depression, anxiety. Acute risk factors for suicide include: family or marital conflict. Protective factors for this patient include: positive social support, coping skills and hope for the future. Considering these factors, the overall suicide risk at this point appears to be low. Patient is  appropriate for outpatient follow up.    Collaboration of Care: Collaboration of Care: Other reviewed notes in Epic  Patient/Guardian was advised Release of Information must be obtained prior to any record release in order to collaborate their care with an outside provider. Patient/Guardian was advised if they have not already done so to contact the registration department to sign all necessary forms in order for [place] to release information regarding their care.   Consent: Patient/Guardian gives verbal consent for treatment and assignment of benefits for services provided during this visit. Patient/Guardian expressed understanding and agreed to proceed.    Neysa Hotter, MD 02/27/2023, 1:54 PM

## 2023-02-27 NOTE — Telephone Encounter (Signed)
I believe she is scheduled for appointment today. Will plan to discuss at her visit.

## 2023-02-27 NOTE — Patient Instructions (Signed)
Increase fluoxetine 60 mg daily  Continue quetiapine 50 mg at night  Continue buspirone 30 mg at night Continue Zaleplon 20 mg at night as needed for sleep Continue Ritalin 10 mg daily Obtain lab (thyroid panel, vitamin D) Next appointment: 11/18 at 8 am

## 2023-02-27 NOTE — Telephone Encounter (Signed)
pt called states that she went to [facility] but had to leave before she could even clock in. she had a break down. patient states she has sucidial thoughts and she also like to have a note for work.

## 2023-03-01 ENCOUNTER — Other Ambulatory Visit
Admission: RE | Admit: 2023-03-01 | Discharge: 2023-03-01 | Disposition: A | Discharge status: Home / Self Care | Payer: Managed Care, Other (non HMO) | Source: Ambulatory Visit | Attending: Psychiatry | Admitting: Psychiatry

## 2023-03-01 DIAGNOSIS — E559 Vitamin D deficiency, unspecified: Secondary | ICD-10-CM | POA: Diagnosis present

## 2023-03-01 DIAGNOSIS — Z1329 Encounter for screening for other suspected endocrine disorder: Secondary | ICD-10-CM | POA: Insufficient documentation

## 2023-03-01 LAB — VITAMIN D 25 HYDROXY (VIT D DEFICIENCY, FRACTURES): Vit D, 25-Hydroxy: 48.7 ng/mL (ref 30–100)

## 2023-03-01 LAB — TSH: TSH: 3.216 u[IU]/mL (ref 0.350–4.500)

## 2023-03-01 LAB — T4, FREE: Free T4: 0.95 ng/dL (ref 0.61–1.12)

## 2023-03-02 ENCOUNTER — Encounter: Payer: Self-pay | Admitting: Psychiatry

## 2023-04-05 NOTE — Progress Notes (Signed)
BH MD/PA/NP OP Progress Note  04/10/2023 5:30 PM Martha [name]  MRN:  [id number]  Chief Complaint:  Chief Complaint  Patient presents with   Follow-up   HPI:  - she was seen by endocrinologist to establish care "Heterogeneous thyroid consistent with known history of Hashimoto's/Hypothyroidism Two left sided nodules versus pseudonodules seen. Benign appearing mildly enlarged lymph nodes. Recommend repeat ultrasound in 1 year to re evaluate possible nodules."  Per chart review, she was seen by cardiologist 03/15/2023 " She is still not feeling too great and continues to have palpitations and chest pain. She is feeling very symptomatic today and her blood pressure is >200/100. She has severe chest tightness. Patient received metoprolol and SL nitro during our visit which brought her blood pressure and heart rate down significantly. She is agreeable to adding metoprolol to her med regiment"   This is a follow-up appointment for depression, anxiety, insomnia and ADHD.  She states that she had a "stroke" and was found to have BP>200.  She has been very stressed at the current work.  She feels that she needs to do work for four people while others are not doing their jobs.  She will have a meeting with HR in December.  She is contemplating whether or not she will get a job in [employer].  She has been dating for the past few months.  It has been many years since she dated the last time, and she is trying to get adjusted to this. Although she enjoys the relationship, she has a struggle to have time for her self. The patient has mood symptoms as in PHQ-9/GAD-7. She denies SI.  She believes anxiety has been little intense since uptitration of fluoxetine.  She also does not feel jittery since being on the beta-blocker.  Although she tends to get irritable when she is interrupted when she is hyper focused, she thinks her focus has been good otherwise.  She rarely drinks alcohol. She denies drug use.   Wt  Readings from Last 3 Encounters:  04/10/23 (!) 331 lb 9.6 oz (150.4 kg)  02/27/23 (!) 326 lb 12.8 oz (148.2 kg)  12/27/22 (!) 346 lb (156.9 kg)         Visit Diagnosis:    ICD-10-CM   1. MDD (major depressive disorder), recurrent episode, moderate (HCC)  F33.1     2. Anxiety disorder, unspecified type  F41.9     3. Panic disorder  F41.0     4. Insomnia, unspecified type  G47.00 zaleplon (SONATA) 10 MG capsule    5. Attention deficit hyperactivity disorder (ADHD), unspecified ADHD type  F90.9       Past Psychiatric History: Please see initial evaluation for full details. I have reviewed the history. No updates at this time.     Past Medical History:  Past Medical History:  Diagnosis Date   ADHD (attention deficit hyperactivity disorder)    no meds taken   Allergy    Anxiety    Complication of anesthesia    woke up during colonoscopy and dental surgery.   Depression    Diabetes mellitus    pre-diabetic,told when younger   Family history of breast cancer    12/20 cancer genetic testing letter sent   Gastritis    GERD (gastroesophageal reflux disease)    Hashimoto's disease    Headache(784.0)    chronic migraines, decrease since BCP started   History of hiatal hernia    Hypertension    Idiopathic intracranial hypertension  Insomnia    Irregular menstrual cycle    pt states she started BCP,unsure of name to help regulate cycle   Obesity    Sleep apnea    Vision abnormalities    near sighted,wears glasses    Past Surgical History:  Procedure Laterality Date   CHOLECYSTECTOMY     COLONOSCOPY WITH PROPOFOL N/A 12/03/2021   Procedure: COLONOSCOPY WITH PROPOFOL;  Surgeon: Regis Bill, MD;  Location: [facility];  Service: Endoscopy;  Laterality: N/A;   ENDOSCOPIC CONCHA BULLOSA RESECTION Right 10/12/2022   Procedure: ENDOSCOPIC CONCHA BULLOSA RESECTION;  Surgeon: Vernie Murders, MD;  Location: [facility];  Service: ENT;  Laterality: Right;    ESOPHAGOGASTRODUODENOSCOPY (EGD) WITH PROPOFOL N/A 12/03/2021   Procedure: ESOPHAGOGASTRODUODENOSCOPY (EGD) WITH PROPOFOL;  Surgeon: Regis Bill, MD;  Location: [facility];  Service: Endoscopy;  Laterality: N/A;  DM   ETHMOIDECTOMY Bilateral 10/12/2022   Procedure: TOTAL ETHMOIDECTOMY;  Surgeon: Vernie Murders, MD;  Location: [facility];  Service: ENT;  Laterality: Bilateral;   IMAGE GUIDED SINUS SURGERY Bilateral 10/12/2022   Procedure: IMAGE GUIDED SINUS SURGERY;  Surgeon: Vernie Murders, MD;  Location: [facility];  Service: ENT;  Laterality: Bilateral;   SEPTOPLASTY N/A 10/12/2022   Procedure: SEPTOPLASTY;  Surgeon: Vernie Murders, MD;  Location: [facility];  Service: ENT;  Laterality: N/A;   TONSILLECTOMY     at 27yo   UPPER GI ENDOSCOPY  01/2012    Family Psychiatric History: Please see initial evaluation for full details. I have reviewed the history. No updates at this time.     Family History:  Family History  Problem Relation Age of Onset   Thyroid cancer Mother 25   Depression Mother    Hypertension Father    Anxiety disorder Father    Diabetes type II Father    Skin cancer Maternal Grandfather    Alzheimer's disease Maternal Grandfather    Alzheimer's disease Paternal Grandmother    Leukemia Paternal Aunt    Breast cancer Paternal Aunt 40   Hypertension Maternal Grandmother    Diabetes type II Maternal Grandmother     Social History:  Social History   Socioeconomic History   Marital status: Single    Spouse name: Not on file   Number of children: Not on file   Years of education: Not on file   Highest education level: Not on file  Occupational History   Occupation: Consulting civil engineer    Comment: [employer] 12th grade  Tobacco Use   Smoking status: Never   Smokeless tobacco: Never  Vaping Use   Vaping status: Former   Quit date: 10/30/2018  Substance and Sexual Activity   Alcohol use: Yes    Comment: occ   Drug use: No   Sexual activity: Not Currently     Partners: Male    Birth control/protection: Pill  Other Topics Concern   Not on file  Social History Narrative   Not on file   Social Determinants of Health   Financial Resource Strain: Low Risk  (03/21/2023)   Received from [facility]   Overall Financial Resource Strain (CARDIA)    Difficulty of Paying Living Expenses: Not hard at all  Food Insecurity: Not on file  Transportation Needs: Not on file  Physical Activity: Inactive (04/07/2017)   Exercise Vital Sign    Days of Exercise per Week: 0 days    Minutes of Exercise per Session: 0 min  Stress: Stress Concern Present (04/07/2017)   [employer] of Occupational  Health - Occupational Stress Questionnaire    Feeling of Stress : To some extent  Social Connections: Somewhat Isolated (04/07/2017)   Social Connection and Isolation Panel [NHANES]    Frequency of Communication with Friends and Family: Once a week    Frequency of Social Gatherings with Friends and Family: More than three times a week    Attends Religious Services: Never    Database administrator or Organizations: Yes    Attends Engineer, structural: More than 4 times per year    Marital Status: Never married    Allergies:  Allergies  Allergen Reactions   Doxycycline Other (See Comments)   Lamisil [Terbinafine]    Wound Dressing Adhesive Hives    "I welp up"    Metabolic Disorder Labs: Lab Results  Component Value Date   HGBA1C 6.5 (H) 04/19/2021   No results found for: "PROLACTIN" Lab Results  Component Value Date   CHOL 151 04/20/2020   TRIG 136 04/20/2020   HDL 40 04/20/2020   CHOLHDL 3.8 04/20/2020   LDLCALC 87 04/20/2020   Lab Results  Component Value Date   TSH 3.216 03/01/2023   TSH 6.670 (H) 04/20/2020    Therapeutic Level Labs: No results found for: "LITHIUM" No results found for: "VALPROATE" No results found for: "CBMZ"  Current Medications: Current Outpatient Medications  Medication Sig Dispense  Refill   ascorbic acid (VITAMIN C) 1000 MG tablet Take 1,000 mg by mouth daily.     calcium carbonate (OSCAL) 1500 (600 Ca) MG TABS tablet Take 600 mg of elemental calcium by mouth daily with breakfast.     cephALEXin (KEFLEX) 500 MG capsule Take 1 capsule (500 mg total) by mouth 2 (two) times daily. 14 capsule 0   Cholecalciferol (VITAMIN D-1000 MAX ST) 25 MCG (1000 UT) tablet Take 1,000 Units by mouth daily.     FLUoxetine (PROZAC) 40 MG capsule Take 1 capsule (40 mg total) by mouth daily. Total of 60 mg daily. Take along with 20 mg cap 90 capsule 0   hydrochlorothiazide (MICROZIDE) 12.5 MG capsule Take 12.5 mg by mouth daily.     levothyroxine (SYNTHROID) 75 MCG tablet Take 75 mcg by mouth daily before breakfast.     magnesium oxide (MAG-OX) 400 MG tablet Take 400 mg by mouth daily.     meloxicam (MOBIC) 7.5 MG tablet Take 1-2 tablets by mouth daily as needed.     metoprolol tartrate (LOPRESSOR) 50 MG tablet Take 50 mg by mouth 2 (two) times daily.     MOUNJARO 2.5 MG/0.5ML Pen Inject into the skin once a week.     Multiple Vitamin (MULTI-VITAMIN) tablet Take by mouth.     Multiple Vitamins-Minerals (ZINC PO) Take 1 tablet by mouth daily.     norethindrone (MICRONOR) 0.35 MG tablet Take 1 tablet by mouth daily.     pantoprazole (PROTONIX) 40 MG tablet Take 40 mg by mouth at bedtime.     promethazine (PHENERGAN) 12.5 MG tablet Take 12.5 mg by mouth every 6 (six) hours as needed for nausea or vomiting.     spironolactone (ALDACTONE) 100 MG tablet Take 200 mg by mouth daily.  9   tizanidine (ZANAFLEX) 2 MG capsule Take 2 mg by mouth at bedtime as needed for muscle spasms.     Ubrogepant (UBRELVY PO) Take 1 tablet by mouth as needed.     vitamin B-12 (CYANOCOBALAMIN) 1000 MCG tablet Take 1,000 mcg by mouth daily.     [START ON 05/07/2023]  busPIRone (BUSPAR) 30 MG tablet Take 1 tablet (30 mg total) by mouth at bedtime. 90 tablet 1   [START ON 04/28/2023] FLUoxetine (PROZAC) 20 MG capsule Take 1  capsule (20 mg total) by mouth daily. Total of 60 mg daily. Take along with 40 mg cap 90 capsule 0   metFORMIN (GLUCOPHAGE) 500 MG tablet Take 250 mg by mouth at bedtime as needed.     [START ON 05/07/2023] methylphenidate (RITALIN) 10 MG tablet Take 1 tablet (10 mg total) by mouth daily. 30 tablet 0   [START ON 06/06/2023] methylphenidate (RITALIN) 10 MG tablet Take 1 tablet (10 mg total) by mouth daily before breakfast. 30 tablet 0   [START ON 05/22/2023] QUEtiapine (SEROQUEL) 50 MG tablet Take 1 tablet (50 mg total) by mouth at bedtime. 90 tablet 0   [START ON 04/24/2023] zaleplon (SONATA) 10 MG capsule Take 2 capsules (20 mg total) by mouth at bedtime. 60 capsule 1   No current facility-administered medications for this visit.     Musculoskeletal: Strength & Muscle Tone:  normal Gait & Station: normal Patient leans: N/A  Psychiatric Specialty Exam: Review of Systems  Psychiatric/Behavioral:  Positive for dysphoric mood and sleep disturbance. Negative for agitation, behavioral problems, confusion, decreased concentration, hallucinations, self-injury and suicidal ideas. The patient is nervous/anxious. The patient is not hyperactive.   All other systems reviewed and are negative.   Blood pressure 125/77, pulse 61, temperature (!) 97.1 F (36.2 C), temperature source Skin, height 5\' 8"  (1.727 m), weight (!) 331 lb 9.6 oz (150.4 kg).Body mass index is 50.42 kg/m.  General Appearance: Well Groomed  Eye Contact:  Good  Speech:  Clear and Coherent  Volume:  Normal  Mood:  Anxious  Affect:  Appropriate, Congruent, and slightly tense  Thought Process:  Coherent  Orientation:  Full (Time, Place, and Person)  Thought Content: Logical   Suicidal Thoughts:  No  Homicidal Thoughts:  No  Memory:  Immediate;   Good  Judgement:  Good  Insight:  Good  Psychomotor Activity:  Normal  Concentration:  Concentration: Good and Attention Span: Good  Recall:  Good  Fund of Knowledge: Good  Language:  Good  Akathisia:  No  Handed:  Right  AIMS (if indicated): not done  Assets:  Communication Skills Desire for Improvement  ADL's:  Intact  Cognition: WNL  Sleep:  Fair   Screenings: GAD-7    Garment/textile technologist Visit from 02/27/2023 in [facility] Visit from 12/27/2022 in [facility] Visit from 10/20/2022 in [facility] Visit from 08/23/2022 in [facility] Office Visit from 06/23/2022 in [facility]  Total GAD-7 Score 14 14 7 7 7       PHQ2-9    Flowsheet Row Office Visit from 02/27/2023 in [facility] Visit from 12/27/2022 in [facility] Office Visit from 10/20/2022 in [facility] Office Visit from 08/23/2022 in [facility] Visit from 06/23/2022 in [facility]  PHQ-2 Total Score 4 4 3 2 3   PHQ-9 Total Score 17 17 10 10 11       Flowsheet Row Office Visit from 02/27/2023 in [facility] Office Visit from 12/27/2022 in [facility] Office Visit from 10/20/2022 in [facility]  C-SSRS RISK CATEGORY Error: Q3, 4, or 5 should not be populated when Q2 is No Error: Q3, 4, or 5 should not be populated when Q2 is No Error: Q3, 4, or 5 should not be populated when Q2 is No        Assessment and Plan:  MADISSON WALKENHORST is a 27 y.o. year old female with a history of depression, anxiety, ADHD by history, migraine, GERD, who presents for follow up appointment for below.    1. MDD (major depressive disorder), recurrent episode, moderate (HCC) 2. Anxiety  disorder, unspecified type 3. Panic disorder Acute stressors include: her mother, s/p back surgery, s/p surgery for deviated septum, work related stress, undergoing evaluation of abnormal uterine bleeding Other stressors include:father with emotional abuse, loss of maternal grandmother from COVID, paternal grandmother with alzheimer, who is in nursing facility     History: Tx from [facility]. Depression, anxiety, insomnia for many years. admitted once in 2013 for depression. Originally on Pristiq 100 mg daily, buspirone 30 mg qhs, zaleplon 20 mg qhs, Ritalin 10 mg daily  She reports an episode of hypertensive urgency in the setting of stress, although she reports good benefit from recent uptitration of fluoxetine.  Will continue current dose at this time to see if it exerts its full benefit over the next few weeks.  Will continue quetiapine as adjunctive treatment for depression and anxiety, along with BuSpar for anxiety.   4. Insomnia, unspecified type - reportedly struggles with insomnia since age 53. She had CPAP study titration 07/2022.s/p surgery for deviated septum She reports improvement in sleep on the weekend.  Will continue current dose of zaleplon at this time to target insomnia.   5. Attention deficit hyperactivity disorder (ADHD), unspecified ADHD type - neuropsych testing was consistent with ADHD. UDS negative 12/2022    Although she reports occasional episodes of hyper focus, it has been stable overall.  Will continue current dose of Ritalin to target ADHD.      Plan Continue fluoxetine 60 mg daily  Continue quetiapine 50 mg at night (on metformin) Continue buspirone 30 mg at night Continue Zaleplon 20 mg at night as needed for sleep Continue Ritalin 10 mg daily Next appointment: 1/9 at 4 pm for 30 mins, IP Referred for therapy, [facility] - A letter will be provided as proof of the visit today. - on metoprolol   Past trials of medication: sertraline, Pristiq, bupropion (some  adverse reaction in the past), Abilify (visual change)     I have reviewed suicide assessment in detail. No change in the following assessment.    The patient demonstrates the following risk factors for suicide: Chronic risk factors for suicide include: psychiatric disorder of depression, anxiety. Acute risk factors for suicide include: family or marital conflict. Protective factors for this patient include: positive social support, coping skills and hope for the future. Considering these factors, the overall suicide risk at this point appears to be low. Patient is appropriate for outpatient follow up.    Collaboration of Care: Collaboration of Care: Other reviewed notes in Epic  Patient/Guardian was advised Release of Information must be obtained prior to any record release in order to collaborate their care with an outside provider. Patient/Guardian was advised if they have not already done so to contact the registration department to sign all necessary forms in order for [place] to release information regarding their care.   Consent: Patient/Guardian gives verbal consent for treatment and assignment of benefits for  services provided during this visit. Patient/Guardian expressed understanding and agreed to proceed.    Martha Hotter, MD 04/10/2023, 5:30 PM

## 2023-04-10 ENCOUNTER — Encounter: Payer: Self-pay | Admitting: Psychiatry

## 2023-04-10 ENCOUNTER — Ambulatory Visit (INDEPENDENT_AMBULATORY_CARE_PROVIDER_SITE_OTHER): Payer: 59 | Admitting: Psychiatry

## 2023-04-10 VITALS — BP 125/77 | HR 61 | Temp 97.1°F | Ht 68.0 in | Wt 331.6 lb

## 2023-04-10 DIAGNOSIS — F419 Anxiety disorder, unspecified: Secondary | ICD-10-CM | POA: Diagnosis not present

## 2023-04-10 DIAGNOSIS — F331 Major depressive disorder, recurrent, moderate: Secondary | ICD-10-CM | POA: Diagnosis not present

## 2023-04-10 DIAGNOSIS — G4700 Insomnia, unspecified: Secondary | ICD-10-CM | POA: Diagnosis not present

## 2023-04-10 DIAGNOSIS — F909 Attention-deficit hyperactivity disorder, unspecified type: Secondary | ICD-10-CM

## 2023-04-10 DIAGNOSIS — F410 Panic disorder [episodic paroxysmal anxiety] without agoraphobia: Secondary | ICD-10-CM

## 2023-04-10 MED ORDER — QUETIAPINE FUMARATE 50 MG PO TABS: 50.0000 mg | ORAL_TABLET | Freq: Every day | ORAL | 0 refills | Status: DC

## 2023-04-10 MED ORDER — FLUOXETINE HCL 20 MG PO CAPS: 20.0000 mg | ORAL_CAPSULE | Freq: Every day | ORAL | 0 refills | Status: DC

## 2023-04-10 MED ORDER — ZALEPLON 10 MG PO CAPS
20.0000 mg | ORAL_CAPSULE | Freq: Every day | ORAL | 1 refills | Status: DC
Start: 2023-04-24 — End: 2023-06-08

## 2023-04-10 MED ORDER — METHYLPHENIDATE HCL 10 MG PO TABS: 10.0000 mg | ORAL_TABLET | Freq: Every day | ORAL | 0 refills | Status: DC

## 2023-04-10 MED ORDER — BUSPIRONE HCL 30 MG PO TABS: 30.0000 mg | ORAL_TABLET | Freq: Every day | ORAL | 1 refills | Status: AC

## 2023-04-10 NOTE — Patient Instructions (Signed)
Continue fluoxetine 60 mg daily  Continue quetiapine 50 mg at night  Continue buspirone 30 mg at night Continue Zaleplon 20 mg at night as needed for sleep Continue Ritalin 10 mg daily Next appointment: 1/9 at 4 pm

## 2023-04-20 ENCOUNTER — Ambulatory Visit: Payer: 59 | Admitting: Psychiatry

## 2023-05-03 ENCOUNTER — Other Ambulatory Visit: Payer: Self-pay | Admitting: Psychiatry

## 2023-05-03 DIAGNOSIS — F331 Major depressive disorder, recurrent, moderate: Secondary | ICD-10-CM

## 2023-05-17 ENCOUNTER — Other Ambulatory Visit (INDEPENDENT_AMBULATORY_CARE_PROVIDER_SITE_OTHER): Payer: Self-pay | Admitting: Nephrology

## 2023-05-17 DIAGNOSIS — I10 Essential (primary) hypertension: Secondary | ICD-10-CM

## 2023-05-17 DIAGNOSIS — G43E09 Chronic migraine with aura, not intractable, without status migrainosus: Secondary | ICD-10-CM

## 2023-05-17 DIAGNOSIS — R8290 Unspecified abnormal findings in urine: Secondary | ICD-10-CM

## 2023-05-17 DIAGNOSIS — E1129 Type 2 diabetes mellitus with other diabetic kidney complication: Secondary | ICD-10-CM

## 2023-05-30 ENCOUNTER — Ambulatory Visit (INDEPENDENT_AMBULATORY_CARE_PROVIDER_SITE_OTHER): Payer: Managed Care, Other (non HMO)

## 2023-05-30 DIAGNOSIS — R8290 Unspecified abnormal findings in urine: Secondary | ICD-10-CM | POA: Diagnosis not present

## 2023-05-30 DIAGNOSIS — E1129 Type 2 diabetes mellitus with other diabetic kidney complication: Secondary | ICD-10-CM

## 2023-05-30 DIAGNOSIS — G43E09 Chronic migraine with aura, not intractable, without status migrainosus: Secondary | ICD-10-CM | POA: Diagnosis not present

## 2023-05-30 DIAGNOSIS — I10 Essential (primary) hypertension: Secondary | ICD-10-CM

## 2023-06-04 NOTE — Progress Notes (Signed)
 [facility] MD/PA/NP OP Progress Note  06/08/2023 5:13 PM Martha [name]  MRN:  [id number]  Chief Complaint:  Chief Complaint  Patient presents with   Follow-up   HPI:  This is a follow-up appointment for depression, anxiety, ADHD and insomnia.  She states that she has been busy, stating stress at work.  The position in [place] was filled. There is another position in a different department.  She is contemplating this.  Her paternal grandmother in facility is not doing well.  She has not seen her since the previous Christmas.  She was feeling very anxious and emotional when her grandmother visited her.  She states that she feels upset, believing her struggles are due to her grandmother. She reflects on the way she was raised by her but also acknowledges that her grandmother played a significant role in raising her.  She reports fair relationship with her boyfriend.  She is still trying to get used to being in a relationship.  She believes her anxiety has been better since being on propranolol.  She also thinks higher dose of fluoxetine  is helping as she is not tense compared to before, although she still feels stressed.  She has occasional insomnia. She reports low appetite since being on injection. She denies SI. She reports fair focus.  She feels comfortable to stay on the current medication regimen.   Wt Readings from Last 3 Encounters:  06/08/23 (!) 326 lb 9.6 oz (148.1 kg)  04/10/23 (!) 331 lb 9.6 oz (150.4 kg)  02/27/23 (!) 326 lb 12.8 oz (148.2 kg)    Visit Diagnosis:    ICD-10-CM   1. MDD (major depressive disorder), recurrent episode, mild (HCC)  F33.0     2. Anxiety disorder, unspecified type  F41.9     3. Panic disorder  F41.0     4. Insomnia, unspecified type  G47.00 zaleplon  (SONATA ) 10 MG capsule    5. Attention deficit hyperactivity disorder (ADHD), unspecified ADHD type  F90.9     6. MDD (major depressive disorder), recurrent episode, moderate (HCC)  F33.1 FLUoxetine  (PROZAC ) 40 MG  capsule      Past Psychiatric History: Please see initial evaluation for full details. I have reviewed the history. No updates at this time.     Past Medical History:  Past Medical History:  Diagnosis Date   ADHD (attention deficit hyperactivity disorder)    no meds taken   Allergy    Anxiety    Complication of anesthesia    woke up during colonoscopy and dental surgery.   Depression    Diabetes mellitus    pre-diabetic,told when younger   Family history of breast cancer    12/20 cancer genetic testing letter sent   Gastritis    GERD (gastroesophageal reflux disease)    Hashimoto's disease    Headache(784.0)    chronic migraines, decrease since BCP started   History of hiatal hernia    Hypertension    Idiopathic intracranial hypertension    Insomnia    Irregular menstrual cycle    pt states she started BCP,unsure of name to help regulate cycle   Obesity    Sleep apnea    Vision abnormalities    near sighted,wears glasses    Past Surgical History:  Procedure Laterality Date   CHOLECYSTECTOMY     COLONOSCOPY WITH PROPOFOL  N/A 12/03/2021   Procedure: COLONOSCOPY WITH PROPOFOL ;  Surgeon: Maryruth Ole DASEN, MD;  Location: [facility];  Service: Endoscopy;  Laterality: N/A;   ENDOSCOPIC  CONCHA BULLOSA RESECTION Right 10/12/2022   Procedure: ENDOSCOPIC CONCHA BULLOSA RESECTION;  Surgeon: Edda Mt, MD;  Location: [facility] ORS;  Service: ENT;  Laterality: Right;   ESOPHAGOGASTRODUODENOSCOPY (EGD) WITH PROPOFOL  N/A 12/03/2021   Procedure: ESOPHAGOGASTRODUODENOSCOPY (EGD) WITH PROPOFOL ;  Surgeon: Maryruth Ole DASEN, MD;  Location: [facility] END[facility];  Service: Endoscopy;  Laterality: N/A;  DM   ETHMOIDECTOMY Bilateral 10/12/2022   Procedure: TOTAL ETHMOIDECTOMY;  Surgeon: Edda Mt, MD;  Location: [facility] ORS;  Service: ENT;  Laterality: Bilateral;   IMAGE GUIDED SINUS SURGERY Bilateral 10/12/2022   Procedure: IMAGE GUIDED SINUS SURGERY;  Surgeon: Edda Mt, MD;  Location: [facility]  ORS;  Service: ENT;  Laterality: Bilateral;   SEPTOPLASTY N/A 10/12/2022   Procedure: SEPTOPLASTY;  Surgeon: Edda Mt, MD;  Location: [facility] OR[facility];  Service: ENT;  Laterality: N/A;   TONSILLECTOMY     at 28yo   UPPER GI ENDOSCOPY  01/2012    Family Psychiatric History: Please see initial evaluation for full details. I have reviewed the history. No updates at this time.     Family History:  Family History  Problem Relation Age of Onset   Thyroid  cancer Mother 43   Depression Mother    Hypertension Father    Anxiety disorder Father    Diabetes type II Father    Skin cancer Maternal Grandfather    Alzheimer's disease Maternal Grandfather    Alzheimer's disease Paternal Grandmother    Leukemia Paternal Aunt    Breast cancer Paternal Aunt 40   Hypertension Maternal Grandmother    Diabetes type II Maternal Grandmother     Social History:  Social History   Socioeconomic History   Marital status: Single    Spouse name: Not on file   Number of children: Not on file   Years of education: Not on file   Highest education level: Not on file  Occupational History   Occupation: Consulting Civil Engineer    Comment: [employer] 12th grade  Tobacco Use   Smoking status: Never   Smokeless tobacco: Never  Vaping Use   Vaping status: Former   Quit date: 10/30/2018  Substance and Sexual Activity   Alcohol use: Yes    Comment: occ   Drug use: No   Sexual activity: Not Currently    Partners: Male    Birth control/protection: Pill  Other Topics Concern   Not on file  Social History Narrative   Not on file   Social Drivers of Health   Financial Resource Strain: Low Risk  (03/21/2023)   Received from [facility]   Overall Financial Resource Strain (CARDIA)    Difficulty of Paying Living Expenses: Not hard at all  Food Insecurity: No Food Insecurity (05/01/2023)   Received from [facility]   Hunger Vital Sign    Worried About Running Out of Food in the Last  Year: Never true    Ran Out of Food in the Last Year: Never true  Transportation Needs: No Transportation Needs (05/01/2023)   [facility] - [facility]    In the past 12 months, has lack of transportation kept you from medical appointments or from getting medications?: No    Lack of Transportation (Non-Medical): No  Physical Activity: Inactive (04/07/2017)   Exercise Vital Sign    Days of Exercise per Week: 0 days    Minutes of Exercise per Session: 0 min  Stress: Stress Concern Present (04/07/2017)   [employer] -  Occupational Stress Questionnaire    Feeling of Stress : To some extent  Social Connections: Somewhat Isolated (04/07/2017)   Social Connection and Isolation Panel [NHANES]    Frequency of Communication with Friends and Family: Once a week    Frequency of Social Gatherings with Friends and Family: More than three times a week    Attends Religious Services: Never    Database Administrator or Organizations: Yes    Attends Engineer, Structural: More than 4 times per year    Marital Status: Never married    Allergies:  Allergies  Allergen Reactions   Doxycycline Other (See Comments)   Lamisil [Terbinafine]    Wound Dressing Adhesive Hives    I welp up    Metabolic Disorder Labs: Lab Results  Component Value Date   HGBA1C 6.5 (H) 04/19/2021   No results found for: PROLACTIN Lab Results  Component Value Date   CHOL 151 04/20/2020   TRIG 136 04/20/2020   HDL 40 04/20/2020   CHOLHDL 3.8 04/20/2020   LDLCALC 87 04/20/2020   Lab Results  Component Value Date   TSH 3.216 03/01/2023   TSH 6.670 (H) 04/20/2020    Therapeutic Level Labs: No results found for: LITHIUM No results found for: VALPROATE No results found for: CBMZ  Current Medications: Current Outpatient Medications  Medication Sig Dispense Refill   ascorbic acid (VITAMIN C) 1000 MG tablet Take 1,000 mg by  mouth daily.     busPIRone  (BUSPAR ) 30 MG tablet Take 1 tablet (30 mg total) by mouth at bedtime. 90 tablet 1   Cholecalciferol (VITAMIN D -1000 MAX ST) 25 MCG (1000 UT) tablet Take 1,000 Units by mouth daily.     hydrochlorothiazide (MICROZIDE) 12.5 MG capsule Take 12.5 mg by mouth daily.     levothyroxine (SYNTHROID) 75 MCG tablet Take 75 mcg by mouth daily before breakfast.     magnesium oxide (MAG-OX) 400 MG tablet Take 400 mg by mouth daily.     metoprolol tartrate (LOPRESSOR) 50 MG tablet Take 50 mg by mouth 2 (two) times daily.     Multiple Vitamin (MULTI-VITAMIN) tablet Take by mouth.     Multiple Vitamins-Minerals (ZINC  PO) Take 1 tablet by mouth daily.     norethindrone  (MICRONOR ) 0.35 MG tablet Take 1 tablet by mouth daily.     pantoprazole (PROTONIX) 40 MG tablet Take 40 mg by mouth at bedtime.     promethazine (PHENERGAN) 12.5 MG tablet Take 12.5 mg by mouth every 6 (six) hours as needed for nausea or vomiting.     QUEtiapine  (SEROQUEL ) 50 MG tablet Take 1 tablet (50 mg total) by mouth at bedtime. 90 tablet 0   spironolactone (ALDACTONE) 100 MG tablet Take 200 mg by mouth daily.  9   tizanidine (ZANAFLEX) 2 MG capsule Take 2 mg by mouth at bedtime as needed for muscle spasms.     Ubrogepant (UBRELVY PO) Take 1 tablet by mouth as needed.     vitamin B-12 (CYANOCOBALAMIN) 1000 MCG tablet Take 1,000 mcg by mouth daily.     calcium carbonate (OSCAL) 1500 (600 Ca) MG TABS tablet Take 600 mg of elemental calcium by mouth daily with breakfast.     [START ON 07/27/2023] FLUoxetine  (PROZAC ) 20 MG capsule Take 1 capsule (20 mg total) by mouth daily. Total of 60 mg daily. Take along with 40 mg cap 90 capsule 0   [START ON 06/21/2023] FLUoxetine  (PROZAC ) 40 MG capsule Take 1 capsule (40 mg total) by mouth  daily. Total of 60 mg daily. Take along with 20 mg cap 90 capsule 1   metFORMIN (GLUCOPHAGE) 500 MG tablet Take 250 mg by mouth at bedtime as needed.     methylphenidate  (RITALIN ) 10 MG tablet  Take 1 tablet (10 mg total) by mouth daily before breakfast. 30 tablet 0   [START ON 07/26/2023] methylphenidate  (RITALIN ) 10 MG tablet Take 1 tablet (10 mg total) by mouth daily. 30 tablet 0   [START ON 06/23/2023] zaleplon  (SONATA ) 10 MG capsule Take 2 capsules (20 mg total) by mouth at bedtime. 60 capsule 1   No current facility-administered medications for this visit.     Musculoskeletal: Strength & Muscle Tone: within normal limits Gait & Station: normal Patient leans: N/A  Psychiatric Specialty Exam: Review of Systems  Psychiatric/Behavioral:  Positive for dysphoric mood and sleep disturbance. Negative for agitation, behavioral problems, confusion, decreased concentration, hallucinations, self-injury and suicidal ideas. The patient is nervous/anxious. The patient is not hyperactive.   All other systems reviewed and are negative.   Blood pressure 112/77, pulse 71, temperature (!) 95.4 F (35.2 C), temperature source Skin, height 5' 8 (1.727 m), weight (!) 326 lb 9.6 oz (148.1 kg).Body mass index is 49.66 kg/m.  General Appearance: Well Groomed  Eye Contact:  Good  Speech:  Clear and Coherent  Volume:  Normal  Mood:  Anxious  Affect:  Appropriate, Congruent, and less tense  Thought Process:  Coherent  Orientation:  Full (Time, Place, and Person)  Thought Content: Logical   Suicidal Thoughts:  No  Homicidal Thoughts:  No  Memory:  Immediate;   Good  Judgement:  Good  Insight:  Good  Psychomotor Activity:  Normal  Concentration:  Concentration: Good and Attention Span: Good  Recall:  Good  Fund of Knowledge: Good  Language: Good  Akathisia:  No  Handed:  Right  AIMS (if indicated): not done  Assets:  Communication Skills Desire for Improvement  ADL's:  Intact  Cognition: WNL  Sleep:  Poor   Screenings: GAD-7    Flowsheet Row Office Visit from 06/08/2023 in [facility] Visit from 02/27/2023 in [facility] from 12/27/2022 in [facility] Office Visit from 10/20/2022 in [facility] Office Visit from 08/23/2022 in [facility]  Total GAD-7 Score 7 14 14 7 7       PHQ2-9    Flowsheet Row Office Visit from 06/08/2023 in [facility] Office Visit from 02/27/2023 in [facility] Visit from 12/27/2022 in [facility] Visit from 10/20/2022 in [facility] Visit from 08/23/2022 in [facility]  PHQ-2 Total Score 2 4 4 3 2   PHQ-9 Total Score 9 17 17 10 10       Flowsheet Row Office Visit from 06/08/2023 in [facility] Office Visit from 02/27/2023 in [facility] Office Visit from 12/27/2022 in [facility]  C-SSRS RISK CATEGORY Error: Q3, 4, or 5 should not be populated when Q2 is No Error: Q3, 4, or 5 should not be populated when Q2 is No Error: Q3, 4, or 5 should not be populated when Q2 is No        Assessment and Plan:  Martha [name] is  a 28 y.o. year old female with a history of depression, anxiety, ADHD by history, migraine, GERD, who presents for follow up appointment for below.    1. MDD (major depressive disorder), recurrent episode, mild (HCC) 2. Anxiety disorder, unspecified type 3. Panic disorder Acute stressors include: her mother, s/p back surgery, s/p surgery for deviated septum, work related stress, undergoing evaluation of abnormal uterine bleeding Other stressors include:father with emotional abuse, loss of maternal grandmother from COVID, paternal grandmother with alzheimer, who is in nursing  facility     History: Tx from [facility]. Depression, anxiety, insomnia for many years. admitted once in 2013 for depression. Originally on Pristiq  100 mg daily, buspirone  30 mg qhs, zaleplon  20 mg qhs, Ritalin  10 mg daily  Exam is notable for calmer affect despite ongoing stress at work, and other stressors as outlined above, which coincided with uptitration of fluoxetine , and being started on metoprolol.  She reports significant anxiety related to seeing her paternal grandmother, which she attributes to her struggles with mood symptoms stemming from the way she was raised. This will be explored further as needed in future sessions. Will continue current dose of fluoxetine  to target depression and anxiety, along with quetiapine  adjunctive treatment for depression.  Will continue BuSpar  for anxiety.  She will greatly benefit from CBT; will make referral.   4. Insomnia, unspecified type - reportedly struggles with insomnia since age 68. She had CPAP study titration 07/2022.s/p surgery for deviated septum Unstable especially over the holiday season.  Will continue current dose of clonazepam at this time to target insomnia.  The hope is to taper off this medication eventually after her mood stabilizes.   5. Attention deficit hyperactivity disorder (ADHD), unspecified ADHD type - neuropsych testing was consistent with ADHD. UDS negative 12/2022     Overall stable.  Will continue current dose of Ritalin  to target ADHD.      Plan Continue fluoxetine  60 mg daily  Continue quetiapine  50 mg at night (on metformin) Continue buspirone  30 mg at night Continue Zaleplon  20 mg at night as needed for sleep Continue Ritalin  10 mg daily Next appointment: 3/20 at 4 pm. IP Referral for therapy onsite - on metoprolol    Past trials of medication: sertraline, Pristiq , bupropion  (some adverse reaction in the past), Abilify (visual change)     I have reviewed suicide assessment in detail. No change in the following  assessment.    The patient demonstrates the following risk factors for suicide: Chronic risk factors for suicide include: psychiatric disorder of depression, anxiety. Acute risk factors for suicide include: family or marital conflict. Protective factors for this patient include: positive social support, coping skills and hope for the future. Considering these factors, the overall suicide risk at this point appears to be low. Patient is appropriate for outpatient follow up.    Collaboration of Care: Collaboration of Care: Other reviewed notes in Epic  Patient/Guardian was advised Release of Information must be obtained prior to any record release in order to collaborate their care with an outside provider. Patient/Guardian was advised if they have not already done so to contact the registration department to sign all necessary forms in order for us  to release information regarding their care.   Consent: Patient/Guardian gives verbal consent for treatment and assignment of benefits for services provided during this visit. Patient/Guardian expressed understanding and agreed to proceed.   I have utilized the [place] Controlled Substances Reporting System (PMP AWARxE) to confirm adherence regarding the patient's medication. My review  reveals appropriate prescription fills.    Katheren Sleet, MD 06/08/2023, 5:13 PM

## 2023-06-08 ENCOUNTER — Ambulatory Visit (INDEPENDENT_AMBULATORY_CARE_PROVIDER_SITE_OTHER): Payer: 59 | Admitting: Psychiatry

## 2023-06-08 ENCOUNTER — Encounter: Payer: Self-pay | Admitting: Psychiatry

## 2023-06-08 VITALS — BP 112/77 | HR 71 | Temp 95.4°F | Ht 68.0 in | Wt 326.6 lb

## 2023-06-08 DIAGNOSIS — F419 Anxiety disorder, unspecified: Secondary | ICD-10-CM

## 2023-06-08 DIAGNOSIS — F909 Attention-deficit hyperactivity disorder, unspecified type: Secondary | ICD-10-CM

## 2023-06-08 DIAGNOSIS — F410 Panic disorder [episodic paroxysmal anxiety] without agoraphobia: Secondary | ICD-10-CM

## 2023-06-08 DIAGNOSIS — F330 Major depressive disorder, recurrent, mild: Secondary | ICD-10-CM

## 2023-06-08 DIAGNOSIS — F331 Major depressive disorder, recurrent, moderate: Secondary | ICD-10-CM

## 2023-06-08 DIAGNOSIS — G4700 Insomnia, unspecified: Secondary | ICD-10-CM

## 2023-06-08 MED ORDER — ZALEPLON 10 MG PO CAPS: 20.0000 mg | ORAL_CAPSULE | Freq: Every day | ORAL | 1 refills | Status: DC

## 2023-06-08 MED ORDER — FLUOXETINE HCL 20 MG PO CAPS: 20.0000 mg | ORAL_CAPSULE | Freq: Every day | ORAL | 0 refills | Status: DC

## 2023-06-08 MED ORDER — FLUOXETINE HCL 40 MG PO CAPS: 40.0000 mg | ORAL_CAPSULE | Freq: Every day | ORAL | 1 refills | Status: DC

## 2023-06-08 MED ORDER — METHYLPHENIDATE HCL 10 MG PO TABS: 10.0000 mg | ORAL_TABLET | Freq: Every day | ORAL | 0 refills | Status: DC

## 2023-06-08 NOTE — Patient Instructions (Signed)
 Continue fluoxetine 60 mg daily  Continue quetiapine 50 mg at night Continue buspirone 30 mg at night Continue Zaleplon 20 mg at night as needed for sleep Continue Ritalin 10 mg daily Next appointment:3/20 at 4 pm

## 2023-07-10 ENCOUNTER — Other Ambulatory Visit: Payer: Self-pay | Admitting: Internal Medicine

## 2023-07-10 DIAGNOSIS — I161 Hypertensive emergency: Secondary | ICD-10-CM

## 2023-07-11 ENCOUNTER — Ambulatory Visit
Admission: RE | Admit: 2023-07-11 | Discharge: 2023-07-11 | Disposition: A | Discharge status: Home / Self Care | Payer: Managed Care, Other (non HMO) | Source: Ambulatory Visit | Attending: Internal Medicine | Admitting: Internal Medicine

## 2023-07-11 DIAGNOSIS — I161 Hypertensive emergency: Secondary | ICD-10-CM | POA: Insufficient documentation

## 2023-07-11 LAB — POCT I-STAT CREATININE: Creatinine, Ser: 0.9 mg/dL (ref 0.44–1.00)

## 2023-07-11 MED ORDER — IOHEXOL 300 MG/ML  SOLN
100.0000 mL | Freq: Once | INTRAMUSCULAR | Status: AC | PRN
  Administered 2023-07-11: 100 mL via INTRAVENOUS

## 2023-07-12 ENCOUNTER — Ambulatory Visit (INDEPENDENT_AMBULATORY_CARE_PROVIDER_SITE_OTHER): Payer: Medicaid Other | Admitting: Professional Counselor

## 2023-07-12 DIAGNOSIS — F330 Major depressive disorder, recurrent, mild: Secondary | ICD-10-CM | POA: Diagnosis not present

## 2023-07-12 DIAGNOSIS — F411 Generalized anxiety disorder: Secondary | ICD-10-CM
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
## 2023-07-27 ENCOUNTER — Encounter: Payer: Self-pay | Admitting: Psychiatry

## 2023-07-27 ENCOUNTER — Telehealth: Payer: Self-pay | Admitting: Psychiatry

## 2023-07-27 ENCOUNTER — Telehealth: Payer: Managed Care, Other (non HMO) | Admitting: Psychiatry

## 2023-07-27 DIAGNOSIS — F410 Panic disorder [episodic paroxysmal anxiety] without agoraphobia: Secondary | ICD-10-CM | POA: Diagnosis not present

## 2023-07-27 DIAGNOSIS — F419 Anxiety disorder, unspecified: Secondary | ICD-10-CM | POA: Diagnosis not present

## 2023-07-27 DIAGNOSIS — F331 Major depressive disorder, recurrent, moderate: Secondary | ICD-10-CM | POA: Diagnosis not present

## 2023-07-27 NOTE — Progress Notes (Signed)
 Virtual Visit via Video Note  I connected with Martha [name] on 07/27/23 at  3:00 PM EST by a video enabled telemedicine application and verified that I am speaking with the correct person using two identifiers.  Location: Patient: car Provider: office Persons participated in the visit- patient, provider    I discussed the limitations of evaluation and management by telemedicine and the availability of in person appointments. The patient expressed understanding and agreed to proceed.   I discussed the assessment and treatment plan with the patient. The patient was provided an opportunity to ask questions and all were answered. The patient agreed with the plan and demonstrated an understanding of the instructions.   The patient was advised to call back or seek an in-person evaluation if the symptoms worsen or if the condition fails to improve as anticipated.    Neysa Hotter, MD    [facility] MD/PA/NP OP Progress Note  07/27/2023 3:36 PM Martha [name]  MRN:  [id number]  Chief Complaint:  Chief Complaint  Patient presents with   Follow-up   HPI:  This appointment is made urgently due to concern of worsening in her mental state, including SI. She is in the car with her mother.  She agrees to proceed with interview.  She states that she had meltdown at work.  She had an anxiety attack.  She called the office crying.  Although people recommended to go to emergency room, she is concerned about the admission.  She was concerned as she previously was admitted.  She also refers to her coworker, who had to leave the job after the similar episode.  She admitted having SI, feeling that it would be the solution. She did not act on it. She denies SI at the current moment.  She feels safe at home as her mother will be with her, although she agrees to contact recent emergency resources if any worsening.  She has been trying to distract herself, as she is not in a good mood.  She has been feeling very  stressed at work this week.  It was overbooked due to the weather the previous week.  There is a Marketing executive.  People are hostile.  She sleeps well.  Her appetite has been increased, which she partly attributes to menstrual cycle.   She would like to have a letter to stay off from work.  She feels comfortable to be back to work Monday.  She believes there will be another coworker coming back to work on Tuesday, who is hard working.  Her mother presents to the visit with the patient consent.  She is aware of the above including SI.  She agrees to contact emergency resources if any worsening/concern of safety.    Wt Readings from Last 3 Encounters:  06/08/23 (!) 326 lb 9.6 oz (148.1 kg)  04/10/23 (!) 331 lb 9.6 oz (150.4 kg)  02/27/23 (!) 326 lb 12.8 oz (148.2 kg)    Visit Diagnosis:    ICD-10-CM   1. MDD (major depressive disorder), recurrent episode, moderate (HCC)  F33.1     2. Anxiety disorder, unspecified type  F41.9     3. Panic disorder  F41.0       Past Psychiatric History: Please see initial evaluation for full details. I have reviewed the history. No updates at this time.     Past Medical History:  Past Medical History:  Diagnosis Date   ADHD (attention deficit hyperactivity disorder)    no meds taken  Allergy    Anxiety    Complication of anesthesia    woke up during colonoscopy and dental surgery.   Depression    Diabetes mellitus    pre-diabetic,told when younger   Family history of breast cancer    12/20 cancer genetic testing letter sent   Gastritis    GERD (gastroesophageal reflux disease)    Hashimoto's disease    Headache(784.0)    chronic migraines, decrease since BCP started   History of hiatal hernia    Hypertension    Idiopathic intracranial hypertension    Insomnia    Irregular menstrual cycle    pt states she started BCP,unsure of name to help regulate cycle   Obesity    Sleep apnea    Vision abnormalities    near sighted,wears glasses     Past Surgical History:  Procedure Laterality Date   CHOLECYSTECTOMY     COLONOSCOPY WITH PROPOFOL N/A 12/03/2021   Procedure: COLONOSCOPY WITH PROPOFOL;  Surgeon: Regis Bill, MD;  Location: [facility] END[facility];  Service: Endoscopy;  Laterality: N/A;   ENDOSCOPIC CONCHA BULLOSA RESECTION Right 10/12/2022   Procedure: ENDOSCOPIC CONCHA BULLOSA RESECTION;  Surgeon: Vernie Murders, MD;  Location: [facility] ORS;  Service: ENT;  Laterality: Right;   ESOPHAGOGASTRODUODENOSCOPY (EGD) WITH PROPOFOL N/A 12/03/2021   Procedure: ESOPHAGOGASTRODUODENOSCOPY (EGD) WITH PROPOFOL;  Surgeon: Regis Bill, MD;  Location: [facility] END[facility]COPY;  Service: Endoscopy;  Laterality: N/A;  DM   ETHMOIDECTOMY Bilateral 10/12/2022   Procedure: TOTAL ETHMOIDECTOMY;  Surgeon: Vernie Murders, MD;  Location: [facility] ORS;  Service: ENT;  Laterality: Bilateral;   IMAGE GUIDED SINUS SURGERY Bilateral 10/12/2022   Procedure: IMAGE GUIDED SINUS SURGERY;  Surgeon: Vernie Murders, MD;  Location: [facility] ORS;  Service: ENT;  Laterality: Bilateral;   SEPTOPLASTY N/A 10/12/2022   Procedure: SEPTOPLASTY;  Surgeon: Vernie Murders, MD;  Location: [facility] ORS;  Service: ENT;  Laterality: N/A;   TONSILLECTOMY     at 28yo   UPPER GI ENDOSCOPY  01/2012    Family Psychiatric History: Please see initial evaluation for full details. I have reviewed the history. No updates at this time.     Family History:  Family History  Problem Relation Age of Onset   Thyroid cancer Mother 61   Depression Mother    Hypertension Father    Anxiety disorder Father    Diabetes type II Father    Skin cancer Maternal Grandfather    Alzheimer's disease Maternal Grandfather    Alzheimer's disease Paternal Grandmother    Leukemia Paternal Aunt    Breast cancer Paternal Aunt 40   Hypertension Maternal Grandmother    Diabetes type II Maternal Grandmother     Social History:  Social History   Socioeconomic History   Marital status: Single    Spouse name: Not on file    Number of children: Not on file   Years of education: Not on file   Highest education level: Not on file  Occupational History   Occupation: Consulting civil engineer    Comment: [employer] 12th grade  Tobacco Use   Smoking status: Never   Smokeless tobacco: Never  Vaping Use   Vaping status: Former   Quit date: 10/30/2018  Substance and Sexual Activity   Alcohol use: Yes    Comment: occ   Drug use: No   Sexual activity: Not Currently    Partners: Male    Birth control/protection: Pill  Other Topics Concern   Not on file  Social History Narrative   Not  on file   Social Drivers of Health   Financial Resource Strain: Low Risk  (07/12/2023)   Overall Financial Resource Strain (CARDIA)    Difficulty of Paying Living Expenses: Not hard at all  Food Insecurity: No Food Insecurity (07/12/2023)   Hunger Vital Sign    Worried About Running Out of Food in the Last Year: Never true    Ran Out of Food in the Last Year: Never true  Transportation Needs: No Transportation Needs (07/12/2023)   PRAPARE - Administrator, Civil Service (Medical): No    Lack of Transportation (Non-Medical): No  Physical Activity: Insufficiently Active (07/12/2023)   Exercise Vital Sign    Days of Exercise per Week: 5 days    Minutes of Exercise per Session: 10 min  Stress: Stress Concern Present (07/12/2023)   [employer] of Occupational Health - Occupational Stress Questionnaire    Feeling of Stress : To some extent  Social Connections: Moderately Isolated (07/12/2023)   Social Connection and Isolation Panel [NHANES]    Frequency of Communication with Friends and Family: More than three times a week    Frequency of Social Gatherings with Friends and Family: Once a week    Attends Religious Services: More than 4 times per year    Active Member of [employer] or Organizations: No    Attends Banker Meetings: Never    Marital Status: Never married    Allergies:  Allergies  Allergen  Reactions   Doxycycline Other (See Comments)   Lamisil [Terbinafine]    Wound Dressing Adhesive Hives    "I welp up"    Metabolic Disorder Labs: Lab Results  Component Value Date   HGBA1C 6.5 (H) 04/19/2021   No results found for: "PROLACTIN" Lab Results  Component Value Date   CHOL 151 04/20/2020   TRIG 136 04/20/2020   HDL 40 04/20/2020   CHOLHDL 3.8 04/20/2020   LDLCALC 87 04/20/2020   Lab Results  Component Value Date   TSH 3.216 03/01/2023   TSH 6.670 (H) 04/20/2020    Therapeutic Level Labs: No results found for: "LITHIUM" No results found for: "VALPROATE" No results found for: "CBMZ"  Current Medications: Current Outpatient Medications  Medication Sig Dispense Refill   ascorbic acid (VITAMIN C) 1000 MG tablet Take 1,000 mg by mouth daily.     busPIRone (BUSPAR) 30 MG tablet Take 1 tablet (30 mg total) by mouth at bedtime. 90 tablet 1   calcium carbonate (OSCAL) 1500 (600 Ca) MG TABS tablet Take 600 mg of elemental calcium by mouth daily with breakfast.     Cholecalciferol (VITAMIN D-1000 MAX ST) 25 MCG (1000 UT) tablet Take 1,000 Units by mouth daily.     FLUoxetine (PROZAC) 20 MG capsule Take 1 capsule (20 mg total) by mouth daily. Total of 60 mg daily. Take along with 40 mg cap 90 capsule 0   FLUoxetine (PROZAC) 40 MG capsule Take 1 capsule (40 mg total) by mouth daily. Total of 60 mg daily. Take along with 20 mg cap 90 capsule 1   hydrochlorothiazide (MICROZIDE) 12.5 MG capsule Take 12.5 mg by mouth daily.     levothyroxine (SYNTHROID) 75 MCG tablet Take 75 mcg by mouth daily before breakfast.     magnesium oxide (MAG-OX) 400 MG tablet Take 400 mg by mouth daily.     metFORMIN (GLUCOPHAGE) 500 MG tablet Take 250 mg by mouth at bedtime as needed.     methylphenidate (RITALIN) 10 MG tablet Take 1  tablet (10 mg total) by mouth daily before breakfast. 30 tablet 0   methylphenidate (RITALIN) 10 MG tablet Take 1 tablet (10 mg total) by mouth daily. 30 tablet 0    metoprolol tartrate (LOPRESSOR) 50 MG tablet Take 50 mg by mouth 2 (two) times daily.     Multiple Vitamin (MULTI-VITAMIN) tablet Take by mouth.     Multiple Vitamins-Minerals (ZINC PO) Take 1 tablet by mouth daily.     norethindrone (MICRONOR) 0.35 MG tablet Take 1 tablet by mouth daily.     pantoprazole (PROTONIX) 40 MG tablet Take 40 mg by mouth at bedtime.     promethazine (PHENERGAN) 12.5 MG tablet Take 12.5 mg by mouth every 6 (six) hours as needed for nausea or vomiting.     QUEtiapine (SEROQUEL) 50 MG tablet Take 1 tablet (50 mg total) by mouth at bedtime. 90 tablet 0   spironolactone (ALDACTONE) 100 MG tablet Take 200 mg by mouth daily.  9   tizanidine (ZANAFLEX) 2 MG capsule Take 2 mg by mouth at bedtime as needed for muscle spasms.     Ubrogepant (UBRELVY PO) Take 1 tablet by mouth as needed.     vitamin B-12 (CYANOCOBALAMIN) 1000 MCG tablet Take 1,000 mcg by mouth daily.     zaleplon (SONATA) 10 MG capsule Take 2 capsules (20 mg total) by mouth at bedtime. 60 capsule 1   No current facility-administered medications for this visit.     Musculoskeletal: Strength & Muscle Tone:  N/A Gait & Station:  N/A Patient leans: N/A  Psychiatric Specialty Exam: Review of Systems  Psychiatric/Behavioral:  Positive for dysphoric mood, sleep disturbance and suicidal ideas. Negative for agitation, behavioral problems, confusion, decreased concentration, hallucinations and self-injury. The patient is nervous/anxious. The patient is not hyperactive.   All other systems reviewed and are negative.   There were no vitals taken for this visit.There is no height or weight on file to calculate BMI.  General Appearance: Well Groomed  Eye Contact:  Good  Speech:  Clear and Coherent  Volume:  Normal  Mood:  Anxious  Affect:  Appropriate, Congruent, and slightly restricted, fatigue  Thought Process:  Coherent  Orientation:  Full (Time, Place, and Person)  Thought Content: Logical   Suicidal  Thoughts:  No  Homicidal Thoughts:  No  Memory:  Immediate;   Good  Judgement:  Good  Insight:  Good  Psychomotor Activity:  Normal  Concentration:  Concentration: Good and Attention Span: Good  Recall:  Good  Fund of Knowledge: Good  Language: Good  Akathisia:  No  Handed:  Right  AIMS (if indicated): not done  Assets:  Communication Skills Desire for Improvement  ADL's:  Intact  Cognition: WNL  Sleep:  Good   Screenings: GAD-7    Advertising copywriter from 07/12/2023 in [facility] Visit from 06/08/2023 in [facility] Office Visit from 02/27/2023 in [facility] Office Visit from 12/27/2022 in [facility] Office Visit from 10/20/2022 in [facility]  Total GAD-7 Score 6 7 14 14 7       PHQ2-9    Flowsheet Row Counselor from 07/12/2023 in [facility] Office Visit from 06/08/2023 in [facility] Office Visit from 02/27/2023 in [facility] Office Visit from 12/27/2022 in [facility] Office Visit from 10/20/2022 in  [facility]  PHQ-2 Total Score 2 2 4 4 3   PHQ-9 Total Score 9 9 17 17 10       Flowsheet Row Counselor from 07/12/2023 in [facility] Office Visit from 06/08/2023 in [facility] Office Visit from 02/27/2023 in [facility]  C-SSRS RISK CATEGORY Moderate Risk Error: Q3, 4, or 5 should not be populated when Q2 is No Error: Q3, 4, or 5 should not be populated when Q2 is No        Assessment and Plan:  ASSIA MEANOR is a 28 y.o. year old female with a history  of depression, anxiety, ADHD by history, migraine, GERD, who presents for follow up appointment for below.    1. MDD (major depressive disorder), recurrent episode, moderate (HCC) 2. Anxiety disorder, unspecified type 3. Panic disorder Acute stressors include: her mother, s/p back surgery, s/p surgery for deviated septum, work related stress, undergoing evaluation of abnormal uterine bleeding Other stressors include:father with emotional abuse, loss of maternal grandmother from COVID, paternal grandmother with alzheimer, who is in nursing facility     History: Tx from [facility]. Depression, anxiety, insomnia for many years. admitted once in 2013 for depression. Originally on Pristiq 100 mg daily, buspirone 30 mg qhs, zaleplon 20 mg qhs, Ritalin 10 mg daily   She experienced SI in the context of worsening in anxiety, panic attacks related to work stress.  She is currently with her mother, who is aware of the situation, and there has been any improvement in her mood since then. She also anticipates a positive change in her work environment early next week. Will uptitrate fluoxetine to optimize treatment for depression and anxiety.  Discussed potential risk of serotonin syndrome with concomitant use of buspirone and Ritalin.  Will continue current dose of BuSpar to target anxiety, along with quetiapine as adjunctive treatment for depression.  She will continue to see Ms. Pricilla Loveless for therapy. Based on the risk assessment as below, it is deemed appropriate to continue outpatient follow up. Both the patient and her mother agreed to utilize emergency resources if any worsening.    4. Insomnia, unspecified type - reportedly struggles with insomnia since age 71. She had CPAP study titration 07/2022.s/p surgery for deviated septum Overall improving.  Will continue current dose of zaleplon to target insomnia. The hope is to taper off this medication eventually after her mood stabilizes.    5. Attention deficit  hyperactivity disorder (ADHD), unspecified ADHD type - neuropsych testing was consistent with ADHD. UDS negative 12/2022     Overall stable.  Will continue current dose of Ritalin to target ADHD.      Plan Increase fluoxetine 80 mg daily  Continue quetiapine 50 mg at night (on metformin) Continue buspirone 30 mg at night Continue Zaleplon 20 mg at night as needed for sleep Continue Ritalin 10 mg daily Next appointment: 3/20 at 4 pm. IP A letter has been created to support her being off from work from February 27 through March 2, with a return to work on March 3 due to her current mental status. - on metoprolol    Past trials of medication: sertraline, Pristiq, bupropion (some adverse reaction in the past), Abilify (visual change)     The patient demonstrates the following risk factors for suicide: Chronic risk factors for suicide include: psychiatric disorder of depression, anxiety. Acute risk factors for suicide include: family or marital conflict, work  related stress. Protective factors for this patient include: positive social support, coping skills and hope for the future. Considering these factors, the overall suicide risk at this point appears to be moderate, but not at imminent risk. Patient is appropriate for outpatient follow up.  Collaboration of Care: Collaboration of Care: Other reviewed notes in Epic  Patient/Guardian was advised Release of Information must be obtained prior to any record release in order to collaborate their care with an outside provider. Patient/Guardian was advised if they have not already done so to contact the registration department to sign all necessary forms in order for [place] to release information regarding their care.   Consent: Patient/Guardian gives verbal consent for treatment and assignment of benefits for services provided during this visit. Patient/Guardian expressed understanding and agreed to proceed.    Neysa Hotter, MD 07/27/2023, 3:36 PM

## 2023-07-27 NOTE — Telephone Encounter (Signed)
 This has been addressed. She was seen today, and her medication was adjusted. We plan to continue outpatient follow-up.

## 2023-07-28 NOTE — Telephone Encounter (Signed)
 PT was given her work excuse note and a ROI was filled out and scanned for her work [employer].

## 2023-08-03 ENCOUNTER — Other Ambulatory Visit: Payer: Self-pay

## 2023-08-03 ENCOUNTER — Emergency Department
Admission: EM | Admit: 2023-08-03 | Discharge: 2023-08-03 | Disposition: A | Discharge status: Home / Self Care | Attending: Emergency Medicine | Admitting: Emergency Medicine

## 2023-08-03 DIAGNOSIS — E119 Type 2 diabetes mellitus without complications: Secondary | ICD-10-CM | POA: Diagnosis not present

## 2023-08-03 DIAGNOSIS — R4589 Other symptoms and signs involving emotional state: Secondary | ICD-10-CM | POA: Insufficient documentation

## 2023-08-03 DIAGNOSIS — F489 Nonpsychotic mental disorder, unspecified: Secondary | ICD-10-CM

## 2023-08-03 DIAGNOSIS — F331 Major depressive disorder, recurrent, moderate: Secondary | ICD-10-CM

## 2023-08-03 DIAGNOSIS — I10 Essential (primary) hypertension: Secondary | ICD-10-CM | POA: Insufficient documentation

## 2023-08-03 LAB — SALICYLATE LEVEL: Salicylate Lvl: <7 mg/dL — ABNORMAL LOW (ref 7.0–30.0)

## 2023-08-03 LAB — CBC WITH DIFFERENTIAL/PLATELET
Abs Immature Granulocytes: 0.05 10*3/uL (ref 0.00–0.07)
Basophils Absolute: 0 10*3/uL (ref 0.0–0.1)
Basophils Relative: 0 %
Eosinophils Absolute: 0.1 10*3/uL (ref 0.0–0.5)
Eosinophils Relative: 1 %
HCT: 38 % (ref 36.0–46.0)
Hemoglobin: 12.3 g/dL (ref 12.0–15.0)
Immature Granulocytes: 0 %
Lymphocytes Relative: 21 %
Lymphs Abs: 2.5 10*3/uL (ref 0.7–4.0)
MCH: 27.7 pg (ref 26.0–34.0)
MCHC: 32.4 g/dL (ref 30.0–36.0)
MCV: 85.6 fL (ref 80.0–100.0)
Monocytes Absolute: 0.7 10*3/uL (ref 0.1–1.0)
Monocytes Relative: 6 %
Neutro Abs: 8.8 10*3/uL — ABNORMAL HIGH (ref 1.7–7.7)
Neutrophils Relative %: 72 %
Platelets: 369 10*3/uL (ref 150–400)
RBC: 4.44 MIL/uL (ref 3.87–5.11)
RDW: 13 % (ref 11.5–15.5)
WBC: 12.1 10*3/uL — ABNORMAL HIGH (ref 4.0–10.5)
nRBC: 0 % (ref 0.0–0.2)

## 2023-08-03 LAB — COMPREHENSIVE METABOLIC PANEL
ALT: 14 U/L (ref 0–44)
AST: 14 U/L — ABNORMAL LOW (ref 15–41)
Albumin: 4.2 g/dL (ref 3.5–5.0)
Alkaline Phosphatase: 41 U/L (ref 38–126)
Anion gap: 11 (ref 5–15)
BUN: 10 mg/dL (ref 6–20)
CO2: 26 mmol/L (ref 22–32)
Calcium: 9.5 mg/dL (ref 8.9–10.3)
Chloride: 99 mmol/L (ref 98–111)
Creatinine, Ser: 0.71 mg/dL (ref 0.44–1.00)
GFR, Estimated: >60 mL/min (ref >60)
Glucose, Bld: 106 mg/dL — ABNORMAL HIGH (ref 70–99)
Potassium: 3.8 mmol/L (ref 3.5–5.1)
Sodium: 136 mmol/L (ref 135–145)
Total Bilirubin: 0.5 mg/dL (ref 0.0–1.2)
Total Protein: 7.7 g/dL (ref 6.5–8.1)

## 2023-08-03 LAB — URINE DRUG SCREEN, QUALITATIVE (ARMC ONLY)
Amphetamines, Ur Screen: NOT DETECTED
Barbiturates, Ur Screen: NOT DETECTED
Benzodiazepine, Ur Scrn: NOT DETECTED
Cannabinoid 50 Ng, Ur ~~LOC~~: NOT DETECTED
Cocaine Metabolite,Ur ~~LOC~~: NOT DETECTED
MDMA (Ecstasy)Ur Screen: NOT DETECTED
Methadone Scn, Ur: NOT DETECTED
Opiate, Ur Screen: NOT DETECTED
Phencyclidine (PCP) Ur S: NOT DETECTED
Tricyclic, Ur Screen: NOT DETECTED

## 2023-08-03 LAB — ACETAMINOPHEN LEVEL: Acetaminophen (Tylenol), Serum: <10 ug/mL — ABNORMAL LOW (ref 10–30)

## 2023-08-03 LAB — POC URINE PREG, ED: Preg Test, Ur: NEGATIVE

## 2023-08-03 LAB — ETHANOL: Alcohol, Ethyl (B): <10 mg/dL (ref <10)

## 2023-08-03 NOTE — ED Notes (Signed)
 Pt in interview room with Martha [name]

## 2023-08-03 NOTE — ED Provider Notes (Signed)
 [facility] Provider Note    Event Date/Time   First MD Initiated Contact with Patient 08/03/23 1344     (approximate)   History   Mental Health Problem   HPI  Martha [name] is a 28 y.o. female with a history of diabetes, hypertension, major depressive disorder, generalized anxiety, and ADHD who presents with what she describes as a mental health crisis.  The patient states that she has been feeling hopeless since earlier today when she got written up at work due to having multiple absences.  The patient denies any SI or HI.  She denies any drug or alcohol use.  She states that she is compliant with her mental health medications.  She denies any acute medical complaints.  I reviewed the past medical records.  The patient's most recent outpatient encounter was with Dr. Burnadette Pop from internal medicine for follow-up of her diabetes.   Physical Exam   Triage Vital Signs: ED Triage Vitals  Encounter Vitals Group     BP 08/03/23 1318 127/78     Systolic BP Percentile --      Diastolic BP Percentile --      Pulse Rate 08/03/23 1318 68     Resp 08/03/23 1318 20     Temp 08/03/23 1318 98.7 F (37.1 C)     Temp Source 08/03/23 1318 Oral     SpO2 08/03/23 1318 98 %     Weight 08/03/23 1334 (!) 326 lb (147.9 kg)     Height --      Head Circumference --      Peak Flow --      Pain Score 08/03/23 1334 0     Pain Loc --      Pain Education --      Exclude from Growth Chart --     Most recent vital signs: Vitals:   08/03/23 1318  BP: 127/78  Pulse: 68  Resp: 20  Temp: 98.7 F (37.1 C)  SpO2: 98%     General: Awake, no distress.  CV:  Good peripheral perfusion.  Resp:  Normal effort.  Abd:  No distention.  Other:  Calm and cooperative.   ED Results / Procedures / Treatments   Labs (all labs ordered are listed, but only abnormal results are displayed) Labs Reviewed  COMPREHENSIVE METABOLIC PANEL - Abnormal; Notable for the following  components:      Result Value   Glucose, Bld 106 (*)    AST 14 (*)    All other components within normal limits  CBC WITH DIFFERENTIAL/PLATELET - Abnormal; Notable for the following components:   WBC 12.1 (*)    Neutro Abs 8.8 (*)    All other components within normal limits  SALICYLATE LEVEL - Abnormal; Notable for the following components:   Salicylate Lvl <7.0 (*)    All other components within normal limits  ACETAMINOPHEN LEVEL - Abnormal; Notable for the following components:   Acetaminophen (Tylenol), Serum <10 (*)    All other components within normal limits  ETHANOL  URINE DRUG SCREEN, QUALITATIVE ([facility] ONLY)  POC URINE PREG, ED     EKG     RADIOLOGY    PROCEDURES:  Critical Care performed: No  Procedures   MEDICATIONS ORDERED IN ED: Medications - No data to display   IMPRESSION / MDM / ASSESSMENT AND PLAN / ED COURSE  I reviewed the triage vital signs and the nursing notes.  28 year old female with PMH as noted above  presents with a feeling of hopelessness but no SI or HI.  She denies any acute medical complaints.  On exam her vital signs are normal.  Physical exam is otherwise unremarkable for acute findings.  Differential diagnosis includes, but is not limited to, major depressive disorder, adjustment disorder, other mood disorder.  We will obtain lab workup for medical screening, and psychiatry and TTS consults.  Patient's presentation is most consistent with acute complicated illness / injury requiring diagnostic workup.  The patient has been placed in psychiatric observation due to the need to provide a safe environment for the patient while obtaining psychiatric consultation and evaluation, as well as ongoing medical and medication management to treat the patient's condition.  The patient has not been placed under full IVC at this time.   ----------------------------------------- 3:29 PM on  08/03/2023 -----------------------------------------  Psychiatry evaluation is pending.  I have handed the patient off to the oncoming ED physician Dr. Fanny Bien.  FINAL CLINICAL IMPRESSION(S) / ED DIAGNOSES   Final diagnoses:  Mental health problem     Rx / DC Orders   ED Discharge Orders     None        Note:  This document was prepared using Dragon voice recognition software and may include unintentional dictation errors.    Dionne Bucy, MD 08/03/23 1530

## 2023-08-03 NOTE — BH Assessment (Signed)
 Comprehensive Clinical Assessment (CCA) Screening, Triage and Referral Note  08/03/2023 Martha [name] [id number]  Chief Complaint:  Chief Complaint  Patient presents with   Mental Health Problem   Visit Diagnosis: Depression  Martha [name] is a 28 year old female who presents to the ER due to getting upset with her employer for getting written up. She states she has missed too many days, and they were because of her mental health. Patient reports of having dx of depression and anxiety. She denies SI/HI and AV/H. She denies history of self-harm and suicidal gestures. She currently receives outpatient treatment through [facility].  Patient Reported Information How did you hear about [place]? Family/Friend  What Is the Reason for Your Visit/Call Today? Patient presents to the ER due to getting upset at work, because she was written up for missing too many days from work, because of her mental health.  How Long Has This Been Causing You Problems? 1 wk - 1 month  What Do You Feel Would Help You the Most Today? Treatment for Depression or other mood problem   Have You Recently Had Any Thoughts About Hurting Yourself? No  Are You Planning to Commit Suicide/Harm Yourself At This time? No   Have you Recently Had Thoughts About Hurting Someone Martha [name]? No  Are You Planning to Harm Someone at This Time? No  Explanation: No data recorded  Have You Used Any Alcohol or Drugs in the Past 24 Hours? No  How Long Ago Did You Use Drugs or Alcohol? No data recorded What Did You Use and How Much? No data recorded  Do You Currently Have a Therapist/Psychiatrist? No  Name of Therapist/Psychiatrist: Dr. Vanetta Shawl   Have You Been Recently Discharged From Any Office Practice or Programs? No  Explanation of Discharge From Practice/Program: No data recorded   CCA Screening Triage Referral Assessment Type of Contact: Face-to-Face  Telemedicine Service Delivery:   Is this Initial or  Reassessment?   Date Telepsych consult ordered in [facility]:    Time Telepsych consult ordered in [facility]:    Location of Assessment: [facility] ED  Provider Location: [facility] ED    Collateral Involvement: None   Does Patient Have a Court Appointed Legal Guardian? No data recorded Name and Contact of Legal Guardian: No data recorded If Minor and Not Living with Parent(s), Who has Custody? No data recorded Is CPS involved or ever been involved? Never  Is APS involved or ever been involved? Never   Patient Determined To Be At Risk for Harm To Self or Others Based on Review of Patient Reported Information or Presenting Complaint? Yes, for Self-Harm  Method: No data recorded Availability of Means: No data recorded Intent: No data recorded Notification Required: No data recorded Additional Information for Danger to Others Potential: No data recorded Additional Comments for Danger to Others Potential: No data recorded Are There Guns or Other Weapons in Your Home? No  Types of Guns/Weapons: Unsure  Are These Weapons Safely Secured?                            No  Who Could Verify You Are Able To Have These Secured: Dad  Do You Have any Outstanding Charges, Pending Court Dates, Parole/Probation? No  Contacted To Inform of Risk of Harm To Self or Others: No data recorded  Does Patient Present under Involuntary Commitment? No   [place] of Residence: [place]   Patient Currently Receiving the Following Services:  Medication Management; Individual Therapy   Determination of Need: Emergent (2 hours)   Options For Referral: ED Visit   Disposition Recommendation per psychiatric provider: Follow up with outpatient provider.  Lilyan Gilford MS, LCAS, [facility], [facility] Therapeutic Triage Specialist 08/03/2023 4:39 PM

## 2023-08-03 NOTE — ED Notes (Signed)
 Dr Fanny Bien talking with pt now.

## 2023-08-03 NOTE — Consult Note (Signed)
 [facility] Psychiatric Consult Initial  Patient Name: .Martha [name]  MRN: [id number]  DOB: [birth year]  Consult Order details:  Orders (From admission, onward)     Start     Ordered   08/03/23 1425  IP CONSULT TO PSYCHIATRY       Ordering Provider: Dionne Bucy, MD  Provider:  (Not yet assigned)  Question Answer Comment  Place call to: Psych NP   Reason for Consult Consult      08/03/23 1424   08/03/23 1425  CONSULT TO CALL ACT TEAM       Ordering Provider: Dionne Bucy, MD  Provider:  (Not yet assigned)  Question:  Reason for Consult?  Answer:  Psych consult   08/03/23 1425             Mode of Visit: Tele-visit Virtual Statement:TELE PSYCHIATRY ATTESTATION & CONSENT As the provider for this telehealth consult, I attest that I verified the patient's identity using two separate identifiers, introduced myself to the patient, provided my credentials, disclosed my location, and performed this encounter via a HIPAA-compliant, real-time, face-to-face, two-way, interactive audio and video platform and with the full consent and agreement of the patient (or guardian as applicable.) Patient physical location: [facility] ED. Telehealth provider physical location: home office in state of [place].   Video start time: 3:30 PM Video end time: 3:50 PM    Psychiatry Consult Evaluation  Service Date: August 03, 2023 LOS:  LOS: 0 days  Chief Complaint "feeling hopeless because I got written up at work because of my mental health"  Primary Psychiatric Diagnoses  hopelessness   Assessment  Martha [name] is a 28 y.o. female presenting voluntarily to [facility] ED on 08/03/2023  1:42 PM for "hopelessness". Patient reported stressors, stating that she "got written up at work because of my mental health. I can't just turn it on and off". Patient states that she came to the hospital in hopes of receiving something to make her feel better. She denies SI, HI, AVH, paranoia or delusional thoughts and states  that she is established with Dr. Harmon Dun at [facility] in outpatient services. She reports that she has recently reestablished care with her therapist at [facility] and has completed one session, with another session scheduled soon. She reports a medication regimen of fluoxetine 80 mg, buspar 30 mg, seroqurel 20 mg, zaleplon 10 mg at bedtime. She reports medication are effective for the most part but I take Zaleplon 20 mg because 10 mg does not work. She reports medication compliance and denies adverse effects.  Patient denies illicit substance use or nicotine use .  Patient has a psychiatric history of MDD of which she was first diagnosed with depression when she was 28 yo, following the death of her aunt due to CA, and her mother being diagnosed with CA. She reports a psychiatric hospitalization at age 65 yo due to SI. Although, she denies SI today, patient states that she experiences passive SI periodically but knows not to act on her thoughts. Patient reports that she was diagnosed with GAD at age 35 yo. She reports a family hx of her father being diagnosed with anxiety and her maternal grandmother and paternal grandfather being diagnosed with dementia.   She reports living at home with her supportive biological parents (mother and father). 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 She states that she has an Associates degree in medical office administration as her highest level of  education.  Patient denies AVH, HI, paranoia or delusional thought.  Please see plan below for detailed recommendations.   Diagnoses:  Active Hospital problems: Active Problems:   MDD (major depressive disorder), recurrent episode, moderate (HCC)   Hopelessness    Plan   ## Psychiatric Medication Recommendations:  Follow up with outpatient provider for medication management due to patient taking excess of prescribed Zaleplon dosage.  Patient recommended to adhere to her prescribed medication regimen. Patient recommended to maintain her upcoming therapy appointment for continued symptom management. Patient to return to services if symptoms increase, call [phone].  ## Medical Decision Making Capacity: Not specifically addressed in this encounter  ## Further Work-up:  -- Defer to EDP EKG -- most recent EKG on 10/13/22 had QtC of 441 -- Pertinent labwork reviewed earlier this admission includes: CBC, CMP, UDS, ethyl alcohol   ## Disposition:-- There are no psychiatric contraindications to discharge at this time  ## Behavioral / Environmental: - No specific recommendations at this time.     ## Safety and Observation Level:  - Based on my clinical evaluation, I estimate the patient to be at low risk of self harm in the current setting. - At this time, we recommend  routine. This decision is based on my review of the chart including patient's history and current presentation, interview of the patient, mental status examination, and consideration of suicide risk including evaluating suicidal ideation, plan, intent, suicidal or self-harm behaviors, risk factors, and protective factors. This judgment is based on our ability to directly address suicide risk, implement suicide prevention strategies, and develop a safety plan while the patient is in the clinical setting. Please contact our team if there is a concern that risk level has changed.  CSSR Risk Category:C-SSRS RISK CATEGORY: No Risk  Suicide Risk Assessment: Patient has following modifiable risk factors for suicide: None, which we are addressing by recommending patient to follow up with outpatient psychiatric provider for medication management and therapy. Patient has following non-modifiable or demographic risk factors for suicide: psychiatric hospitalization Patient has the following protective factors against suicide: Supportive family  Thank you for this consult  request. Recommendations have been communicated to the primary team.  We will psychiatrically clear patient once medically clear at this time.   Mcneil Sober, NP       History of Present Illness  Relevant Aspects of Hospital ED Course:  Admitted on 08/03/2023 for hopelessness.   Patient Report:  "Things just been piling up"  Psych ROS:  Depression: yes. Depressed mood and history of MDD Anxiety:  hx of anxiety reported by patient Mania (lifetime and current): Denies Psychosis: (lifetime and current): Denies    Review of Systems  Psychiatric/Behavioral:  Positive for depression. Negative for hallucinations, memory loss, substance abuse and suicidal ideas. The patient does not have insomnia.   All other systems reviewed and are negative.    Psychiatric and Social History  Psychiatric History:  Information collected from patient and ED treatment team  Prev Dx/Sx: MDD Current Psych Provider: Dr. Levy Sjogren Home Meds (current): See above Previous Med Trials: See above Therapy: yes. Established with a therapist at Dr. Janeece Riggers office. Next appointment scheduled. One appointment so far.  Prior Psych Hospitalization: Yes.  See above Prior Self Harm:yes.  See above Prior Violence: Denies  Family Psych History: yes Family Hx suicide: denies  Social History:  Developmental Hx: IEP dyslexia, ADHD Educational Hx: Associate's degree Occupational Hx: Print production planner Hx: Denies Living Situation: parents Spiritual Hx: Yes  Access to weapons/lethal means: yes.  My dad's guns are hidden and I would not even know how to work him  Substance History Alcohol: occasionally  Type of alcohol mixed drink Last Drink Sept 13 2024 Number of drinks per day 1 History of alcohol withdrawal seizures denies History of DT's denies Tobacco: denies Illicit drugs: denies Prescription drug abuse: Denies although she is taking an additional dosage of zaleplon Rehab hx: denies  Exam Findings   Physical Exam: Within normal limits Vital Signs:  Temp:  [98.7 F (37.1 C)] 98.7 F (37.1 C) (03/06 1318) Pulse Rate:  [68] 68 (03/06 1318) Resp:  [20] 20 (03/06 1318) BP: (127)/(78) 127/78 (03/06 1318) SpO2:  [98 %] 98 % (03/06 1318) Weight:  [147.9 kg] 147.9 kg (03/06 1334) Blood pressure 127/78, pulse 68, temperature 98.7 F (37.1 C), temperature source Oral, resp. rate 20, weight (!) 147.9 kg, SpO2 98%. Body mass index is 49.57 kg/m.  Physical Exam Vitals and nursing note reviewed.  Constitutional:      Appearance: Normal appearance.  Neurological:     General: No focal deficit present.     Mental Status: She is alert and oriented to person, place, and time.  Psychiatric:        Behavior: Behavior normal.        Thought Content: Thought content normal.        Judgment: Judgment normal.     Mental Status Exam: General Appearance: Fairly Groomed  Orientation:  Full (Time, Place, and Person)  Memory:  Immediate;   Good Recent;   Good Remote;   Good  Concentration:  Concentration: Good and Attention Span: Good  Recall:  Good  Attention  Good  Eye Contact:  Good  Speech:  Clear and Coherent  Language:  Good  Volume:  Normal  Mood: Depressed  Affect:  Appropriate  Thought Process:  Coherent  Thought Content:  Logical  Suicidal Thoughts:  No  Homicidal Thoughts:  No  Judgement:  Good  Insight:  Good  Psychomotor Activity:  Normal  Akathisia:  No  Fund of Knowledge:  Good      Assets:  Communication Skills Desire for Improvement Financial Resources/Insurance Housing Social Support  Cognition:  WNL  ADL's:  Intact  AIMS (if indicated):        Other History   These have been pulled in through the EMR, reviewed, and updated if appropriate.  Family History:  The patient's family history includes Alzheimer's disease in her maternal grandfather and paternal grandmother; Anxiety disorder in her father; Breast cancer (age of onset: 64) in her paternal aunt;  Depression in her mother; Diabetes type II in her father and maternal grandmother; Hypertension in her father and maternal grandmother; Leukemia in her paternal aunt; Skin cancer in her maternal grandfather; Thyroid cancer (age of onset: 37) in her mother.  Medical History: Past Medical History:  Diagnosis Date   ADHD (attention deficit hyperactivity disorder)    no meds taken   Allergy    Anxiety    Complication of anesthesia    woke up during colonoscopy and dental surgery.   Depression    Diabetes mellitus    pre-diabetic,told when younger   Family history of breast cancer    12/20 cancer genetic testing letter sent   Gastritis    GERD (gastroesophageal reflux disease)    Hashimoto's disease    Headache(784.0)    chronic migraines, decrease since BCP started   History of hiatal hernia    Hypertension  Idiopathic intracranial hypertension    Insomnia    Irregular menstrual cycle    pt states she started BCP,unsure of name to help regulate cycle   Obesity    Sleep apnea    Vision abnormalities    near sighted,wears glasses    Surgical History: Past Surgical History:  Procedure Laterality Date   CHOLECYSTECTOMY     COLONOSCOPY WITH PROPOFOL N/A 12/03/2021   Procedure: COLONOSCOPY WITH PROPOFOL;  Surgeon: Regis Bill, MD;  Location: [facility] END[facility]COPY;  Service: Endoscopy;  Laterality: N/A;   ENDOSCOPIC CONCHA BULLOSA RESECTION Right 10/12/2022   Procedure: ENDOSCOPIC CONCHA BULLOSA RESECTION;  Surgeon: Vernie Murders, MD;  Location: [facility] ORS;  Service: ENT;  Laterality: Right;   ESOPHAGOGASTRODUODENOSCOPY (EGD) WITH PROPOFOL N/A 12/03/2021   Procedure: ESOPHAGOGASTRODUODENOSCOPY (EGD) WITH PROPOFOL;  Surgeon: Regis Bill, MD;  Location: [facility] ENDOSCOPY;  Service: Endoscopy;  Laterality: N/A;  DM   ETHMOIDECTOMY Bilateral 10/12/2022   Procedure: TOTAL ETHMOIDECTOMY;  Surgeon: Vernie Murders, MD;  Location: [facility] ORS;  Service: ENT;  Laterality: Bilateral;   IMAGE  GUIDED SINUS SURGERY Bilateral 10/12/2022   Procedure: IMAGE GUIDED SINUS SURGERY;  Surgeon: Vernie Murders, MD;  Location: [facility] ORS;  Service: ENT;  Laterality: Bilateral;   SEPTOPLASTY N/A 10/12/2022   Procedure: SEPTOPLASTY;  Surgeon: Vernie Murders, MD;  Location: [facility] ORS;  Service: ENT;  Laterality: N/A;   TONSILLECTOMY     at 28yo   UPPER GI ENDOSCOPY  01/2012     Medications:  No current facility-administered medications for this encounter.  Current Outpatient Medications:    ascorbic acid (VITAMIN C) 1000 MG tablet, Take 1,000 mg by mouth daily., Disp: , Rfl:    busPIRone (BUSPAR) 30 MG tablet, Take 1 tablet (30 mg total) by mouth at bedtime., Disp: 90 tablet, Rfl: 1   calcium carbonate (OSCAL) 1500 (600 Ca) MG TABS tablet, Take 600 mg of elemental calcium by mouth daily with breakfast., Disp: , Rfl:    Cholecalciferol (VITAMIN D-1000 MAX ST) 25 MCG (1000 UT) tablet, Take 1,000 Units by mouth daily., Disp: , Rfl:    FLUoxetine (PROZAC) 20 MG capsule, Take 1 capsule (20 mg total) by mouth daily. Total of 60 mg daily. Take along with 40 mg cap, Disp: 90 capsule, Rfl: 0   FLUoxetine (PROZAC) 40 MG capsule, Take 1 capsule (40 mg total) by mouth daily. Total of 60 mg daily. Take along with 20 mg cap, Disp: 90 capsule, Rfl: 1   hydrochlorothiazide (MICROZIDE) 12.5 MG capsule, Take 12.5 mg by mouth daily., Disp: , Rfl:    levothyroxine (SYNTHROID) 75 MCG tablet, Take 75 mcg by mouth daily before breakfast., Disp: , Rfl:    magnesium oxide (MAG-OX) 400 MG tablet, Take 400 mg by mouth daily., Disp: , Rfl:    metFORMIN (GLUCOPHAGE) 500 MG tablet, Take 250 mg by mouth at bedtime as needed., Disp: , Rfl:    methylphenidate (RITALIN) 10 MG tablet, Take 1 tablet (10 mg total) by mouth daily before breakfast., Disp: 30 tablet, Rfl: 0   methylphenidate (RITALIN) 10 MG tablet, Take 1 tablet (10 mg total) by mouth daily., Disp: 30 tablet, Rfl: 0   metoprolol tartrate (LOPRESSOR) 50 MG tablet, Take 50 mg  by mouth 2 (two) times daily., Disp: , Rfl:    Multiple Vitamin (MULTI-VITAMIN) tablet, Take by mouth., Disp: , Rfl:    Multiple Vitamins-Minerals (ZINC PO), Take 1 tablet by mouth daily., Disp: , Rfl:    norethindrone (MICRONOR) 0.35 MG tablet, Take 1  tablet by mouth daily., Disp: , Rfl:    pantoprazole (PROTONIX) 40 MG tablet, Take 40 mg by mouth at bedtime., Disp: , Rfl:    promethazine (PHENERGAN) 12.5 MG tablet, Take 12.5 mg by mouth every 6 (six) hours as needed for nausea or vomiting., Disp: , Rfl:    QUEtiapine (SEROQUEL) 50 MG tablet, Take 1 tablet (50 mg total) by mouth at bedtime., Disp: 90 tablet, Rfl: 0   spironolactone (ALDACTONE) 100 MG tablet, Take 200 mg by mouth daily., Disp: , Rfl: 9   tizanidine (ZANAFLEX) 2 MG capsule, Take 2 mg by mouth at bedtime as needed for muscle spasms., Disp: , Rfl:    Ubrogepant (UBRELVY PO), Take 1 tablet by mouth as needed., Disp: , Rfl:    vitamin B-12 (CYANOCOBALAMIN) 1000 MCG tablet, Take 1,000 mcg by mouth daily., Disp: , Rfl:    zaleplon (SONATA) 10 MG capsule, Take 2 capsules (20 mg total) by mouth at bedtime., Disp: 60 capsule, Rfl: 1  Allergies: Allergies  Allergen Reactions   Doxycycline Other (See Comments)   Lamisil [Terbinafine]    Wellbutrin [Bupropion] Other (See Comments)    Suicidal Ideation    Wound Dressing Adhesive Hives    "I welp up"    Mcneil Sober, NP

## 2023-08-03 NOTE — ED Provider Notes (Signed)
 NP [facility] advising 'Follow up with outpatient provider for medication management due to patient taking excess of prescribed Zaleplon dosage. Patient recommended to adhere to her prescribed medication regimen. Patient recommended to maintain her upcoming therapy appointment for continued symptom management. '  Return precautions and treatment recommendations and follow-up discussed with the patient who is agreeable with the plan.  Patient resting comfortably without distress.  Currently finishing meal.  Agreeable with plan to follow-up closely with outpatient psychiatry provider    Sharyn Creamer, MD 08/03/23 1718

## 2023-08-03 NOTE — Discharge Instructions (Signed)
You have been seen in the Emergency Department (ED) today for a psychiatric complaint.  You have been evaluated by psychiatry and we believe you are safe to be discharged from the hospital.   ° °Please return to the ED immediately if you have ANY thoughts of hurting yourself or anyone else, so that we may help you. ° °Please avoid alcohol and drug use. ° °Follow up with your doctor and/or therapist as soon as possible regarding today's ED visit.   Please follow up any other recommendations and clinic appointments provided by the psychiatry team that saw you in the Emergency Department. ° °

## 2023-08-03 NOTE — ED Triage Notes (Signed)
 Pt arrives with a mental health concern. Per pt, she was at work today and she was "triggered" due to getting written up for absences at work. Pt has had multiple absences at work due to her mental health. Pt denies SI/HI. Pt voices she feels hopeless and doesn't know what to do. Pt is voluntary at this time.

## 2023-08-03 NOTE — ED Notes (Signed)
 Pt provided with dinner tray. Pt sitting up eating

## 2023-08-03 NOTE — ED Notes (Signed)
 This tech dressed patient out; Belongings included:  1 gray jacket 1 pair of socks 1 pair of black shoes 1 pair black scrub pants 1 cell phone 1 black watch 1 small purse 1 pair gray underwear 1 blue pattered scrub top 1 black tank top 1 black bra 1 tan bra

## 2023-08-13 NOTE — Progress Notes (Unsigned)
 [facility] MD/PA/NP OP Progress Note  08/17/2023 4:36 PM Martha [name]  MRN:  [id number]  Chief Complaint:  Chief Complaint  Patient presents with   Follow-up   HPI:  According to the chart review, the following events have occurred since the last visit: The patient was seen at ED for worsening in depression.   This is a follow-up appointment for depression, anxiety, insomnia. She states that she went to ED due to mental crisis.  She was called by head of HR and others, and was written up for absences.  She had SI, although she did not have any intent or plan.  She had intense anxiety, feeling depressed.  She has been doing fine after worse, and has been back to work.  One of the staff has left, and there is a Marketing executive.  She tends to feel drowsy in the morning, which is new.  She is unsure if it is related to menstrual cycle.  She feels stressed after coming back from work.  She plays video games, paintings, and works on [employer].  She states that the relationship is good.  They have been talking about marriage and having kids.  They first tried to find a place to living together as they live with their parents.  She feels comfortable, and likes the relationship.  Although she does not feel good compared to the time she was first up titrated fluoxetine, she feels comfortable to stay on the current medication regimen for now.  Her focus is fair.  she denies alcohol use since last September.  She denies substance use.   Wt Readings from Last 3 Encounters:  08/17/23 (!) 314 lb 6.4 oz (142.6 kg)  08/03/23 (!) 326 lb (147.9 kg)  06/08/23 (!) 326 lb 9.6 oz (148.1 kg)     Visit Diagnosis:    ICD-10-CM   1. MDD (major depressive disorder), recurrent episode, moderate (HCC)  F33.1     2. Insomnia, unspecified type  G47.00 zaleplon (SONATA) 10 MG capsule    3. Generalized anxiety disorder  F41.1     4. Panic disorder  F41.0     5. Attention deficit hyperactivity disorder (ADHD),  unspecified ADHD type  F90.9       Past Psychiatric History: Please see initial evaluation for full details. I have reviewed the history. No updates at this time.     Past Medical History:  Past Medical History:  Diagnosis Date   ADHD (attention deficit hyperactivity disorder)    no meds taken   Allergy    Anxiety    Complication of anesthesia    woke up during colonoscopy and dental surgery.   Depression    Diabetes mellitus    pre-diabetic,told when younger   Family history of breast cancer    12/20 cancer genetic testing letter sent   Gastritis    GERD (gastroesophageal reflux disease)    Hashimoto's disease    Headache(784.0)    chronic migraines, decrease since BCP started   History of hiatal hernia    Hypertension    Idiopathic intracranial hypertension    Insomnia    Irregular menstrual cycle    pt states she started BCP,unsure of name to help regulate cycle   Obesity    Sleep apnea    Vision abnormalities    near sighted,wears glasses    Past Surgical History:  Procedure Laterality Date   CHOLECYSTECTOMY     COLONOSCOPY WITH PROPOFOL N/A 12/03/2021   Procedure: COLONOSCOPY  WITH PROPOFOL;  Surgeon: Regis Bill, MD;  Location: [facility] ENDOSCOPY;  Service: Endoscopy;  Laterality: N/A;   ENDOSCOPIC CONCHA BULLOSA RESECTION Right 10/12/2022   Procedure: ENDOSCOPIC CONCHA BULLOSA RESECTION;  Surgeon: Vernie Murders, MD;  Location: [facility] ORS;  Service: ENT;  Laterality: Right;   ESOPHAGOGASTRODUODENOSCOPY (EGD) WITH PROPOFOL N/A 12/03/2021   Procedure: ESOPHAGOGASTRODUODENOSCOPY (EGD) WITH PROPOFOL;  Surgeon: Regis Bill, MD;  Location: [facility] ENDOSCOPY;  Service: Endoscopy;  Laterality: N/A;  DM   ETHMOIDECTOMY Bilateral 10/12/2022   Procedure: TOTAL ETHMOIDECTOMY;  Surgeon: Vernie Murders, MD;  Location: [facility] ORS;  Service: ENT;  Laterality: Bilateral;   IMAGE GUIDED SINUS SURGERY Bilateral 10/12/2022   Procedure: IMAGE GUIDED SINUS SURGERY;  Surgeon: Vernie Murders, MD;  Location: [facility] ORS;  Service: ENT;  Laterality: Bilateral;   SEPTOPLASTY N/A 10/12/2022   Procedure: SEPTOPLASTY;  Surgeon: Vernie Murders, MD;  Location: [facility] ORS;  Service: ENT;  Laterality: N/A;   TONSILLECTOMY     at 28yo   UPPER GI ENDOSCOPY  01/2012    Family Psychiatric History: Please see initial evaluation for full details. I have reviewed the history. No updates at this time.     Family History:  Family History  Problem Relation Age of Onset   Thyroid cancer Mother 40   Depression Mother    Hypertension Father    Anxiety disorder Father    Diabetes type II Father    Skin cancer Maternal Grandfather    Alzheimer's disease Maternal Grandfather    Alzheimer's disease Paternal Grandmother    Leukemia Paternal Aunt    Breast cancer Paternal Aunt 40   Hypertension Maternal Grandmother    Diabetes type II Maternal Grandmother     Social History:  Social History   Socioeconomic History   Marital status: Single    Spouse name: Not on file   Number of children: Not on file   Years of education: Not on file   Highest education level: Not on file  Occupational History   Occupation: Consulting civil engineer    Comment: [employer] 12th grade  Tobacco Use   Smoking status: Never   Smokeless tobacco: Never  Vaping Use   Vaping status: Former   Quit date: 10/30/2018  Substance and Sexual Activity   Alcohol use: Yes    Comment: occ   Drug use: No   Sexual activity: Not Currently    Partners: Male    Birth control/protection: Pill  Other Topics Concern   Not on file  Social History Narrative   Not on file   Social Drivers of Health   Financial Resource Strain: Low Risk  (07/12/2023)   Overall Financial Resource Strain (CARDIA)    Difficulty of Paying Living Expenses: Not hard at all  Food Insecurity: No Food Insecurity (07/12/2023)   Hunger Vital Sign    Worried About Running Out of Food in the Last Year: Never true    Ran Out of Food in the Last Year: Never true   Transportation Needs: No Transportation Needs (07/12/2023)   PRAPARE - Administrator, Civil Service (Medical): No    Lack of Transportation (Non-Medical): No  Physical Activity: Insufficiently Active (07/12/2023)   Exercise Vital Sign    Days of Exercise per Week: 5 days    Minutes of Exercise per Session: 10 min  Stress: Stress Concern Present (07/12/2023)   [employer] of Occupational Health - Occupational Stress Questionnaire    Feeling of Stress : To some extent  Social  Connections: Moderately Isolated (07/12/2023)   Social Connection and Isolation Panel [NHANES]    Frequency of Communication with Friends and Family: More than three times a week    Frequency of Social Gatherings with Friends and Family: Once a week    Attends Religious Services: More than 4 times per year    Active Member of [employer] or Organizations: No    Attends Banker Meetings: Never    Marital Status: Never married    Allergies:  Allergies  Allergen Reactions   Doxycycline Other (See Comments)   Lamisil [Terbinafine]    Wellbutrin [Bupropion] Other (See Comments)    Suicidal Ideation    Wound Dressing Adhesive Hives    "I welp up"    Metabolic Disorder Labs: Lab Results  Component Value Date   HGBA1C 6.5 (H) 04/19/2021   No results found for: "PROLACTIN" Lab Results  Component Value Date   CHOL 151 04/20/2020   TRIG 136 04/20/2020   HDL 40 04/20/2020   CHOLHDL 3.8 04/20/2020   LDLCALC 87 04/20/2020   Lab Results  Component Value Date   TSH 3.216 03/01/2023   TSH 6.670 (H) 04/20/2020    Therapeutic Level Labs: No results found for: "LITHIUM" No results found for: "VALPROATE" No results found for: "CBMZ"  Current Medications: Current Outpatient Medications  Medication Sig Dispense Refill   ascorbic acid (VITAMIN C) 1000 MG tablet Take 1,000 mg by mouth daily.     busPIRone (BUSPAR) 30 MG tablet Take 1 tablet (30 mg total) by mouth at bedtime. 90 tablet 1    Cholecalciferol (VITAMIN D-1000 MAX ST) 25 MCG (1000 UT) tablet Take 1,000 Units by mouth daily.     FLUoxetine (PROZAC) 20 MG capsule Take 1 capsule (20 mg total) by mouth daily. Total of 60 mg daily. Take along with 40 mg cap 90 capsule 0   FLUoxetine (PROZAC) 40 MG capsule Take 1 capsule (40 mg total) by mouth daily. Total of 60 mg daily. Take along with 20 mg cap 90 capsule 1   hydrochlorothiazide (MICROZIDE) 12.5 MG capsule Take 12.5 mg by mouth daily.     levothyroxine (SYNTHROID) 75 MCG tablet Take 75 mcg by mouth daily before breakfast.     magnesium oxide (MAG-OX) 400 MG tablet Take 400 mg by mouth daily.     methylphenidate (RITALIN) 10 MG tablet Take 1 tablet (10 mg total) by mouth daily. 30 tablet 0   metoprolol tartrate (LOPRESSOR) 50 MG tablet Take 50 mg by mouth 2 (two) times daily.     Multiple Vitamin (MULTI-VITAMIN) tablet Take by mouth.     Multiple Vitamins-Minerals (ZINC PO) Take 1 tablet by mouth daily.     norethindrone (MICRONOR) 0.35 MG tablet Take 1 tablet by mouth daily.     pantoprazole (PROTONIX) 40 MG tablet Take 40 mg by mouth at bedtime.     promethazine (PHENERGAN) 12.5 MG tablet Take 12.5 mg by mouth every 6 (six) hours as needed for nausea or vomiting.     spironolactone (ALDACTONE) 100 MG tablet Take 200 mg by mouth daily.  9   tizanidine (ZANAFLEX) 2 MG capsule Take 2 mg by mouth at bedtime as needed for muscle spasms.     Ubrogepant (UBRELVY PO) Take 1 tablet by mouth as needed.     vitamin B-12 (CYANOCOBALAMIN) 1000 MCG tablet Take 1,000 mcg by mouth daily.     calcium carbonate (OSCAL) 1500 (600 Ca) MG TABS tablet Take 600 mg of elemental calcium by  mouth daily with breakfast.     metFORMIN (GLUCOPHAGE) 500 MG tablet Take 250 mg by mouth at bedtime as needed.     methylphenidate (RITALIN) 10 MG tablet Take 1 tablet (10 mg total) by mouth daily before breakfast. 30 tablet 0   [START ON 08/20/2023] QUEtiapine (SEROQUEL) 50 MG tablet Take 1 tablet (50 mg  total) by mouth at bedtime. 90 tablet 0   [START ON 08/22/2023] zaleplon (SONATA) 10 MG capsule Take 2 capsules (20 mg total) by mouth at bedtime. 60 capsule 3   No current facility-administered medications for this visit.     Musculoskeletal: Strength & Muscle Tone: within normal limits Gait & Station: normal Patient leans: N/A  Psychiatric Specialty Exam: Review of Systems  Psychiatric/Behavioral:  Positive for dysphoric mood, sleep disturbance and suicidal ideas. Negative for agitation, behavioral problems, confusion, decreased concentration, hallucinations and self-injury. The patient is nervous/anxious. The patient is not hyperactive.   All other systems reviewed and are negative.   Blood pressure 122/78, pulse 88, temperature 98.7 F (37.1 C), temperature source Temporal, height 5\' 8"  (1.727 m), weight (!) 314 lb 6.4 oz (142.6 kg), last menstrual period 08/17/2023, SpO2 97%.Body mass index is 47.8 kg/m.  General Appearance: Well Groomed  Eye Contact:  Good  Speech:  Clear and Coherent  Volume:  Normal  Mood:   anxious  Affect:  Appropriate, Congruent, and Full Range  Thought Process:  Coherent  Orientation:  Full (Time, Place, and Person)  Thought Content: Logical   Suicidal Thoughts:  No  Homicidal Thoughts:  No  Memory:  Immediate;   Good  Judgement:  Good  Insight:  Good  Psychomotor Activity:  Normal, Normal tone, no rigidity, no resting/postural tremors, no tardive dyskinesia    Concentration:  Concentration: Good and Attention Span: Good  Recall:  Good  Fund of Knowledge: Good  Language: Good  Akathisia:  No  Handed:  Right  AIMS (if indicated): 0   Assets:  Communication Skills Desire for Improvement  ADL's:  Intact  Cognition: WNL  Sleep:   hypersomnia   Screenings: GAD-7    Advertising copywriter from 07/12/2023 in [facility] Office Visit from 06/08/2023 in [facility]  Office Visit from 02/27/2023 in [facility] Office Visit from 12/27/2022 in [facility] Office Visit from 10/20/2022 in [name]  Total GAD-7 Score 6 7 14 14 7       PHQ2-9    Flowsheet Row Counselor from 07/12/2023 in [facility] Office Visit from 06/08/2023 in [facility] Office Visit from 02/27/2023 in [facility] Office Visit from 12/27/2022 in [facility] Office Visit from 10/20/2022 in [facility]  PHQ-2 Total Score 2 2 4 4 3   PHQ-9 Total Score 9 9 17 17 10       Flowsheet Row ED from 08/03/2023 in [facility] Counselor from 07/12/2023 in [facility] Office Visit from 06/08/2023 in [facility]  C-SSRS RISK CATEGORY No Risk Moderate Risk Error: Q3, 4, or 5 should not be populated when Q2 is No        Assessment and Plan:  AVERI KILTY is a 28 y.o. year old female with a history of depression, anxiety, ADHD by  history, migraine, GERD, who presents for follow up appointment for below.    2. MDD (major depressive disorder), recurrent episode, moderate (HCC) 3. Generalized anxiety disorder 4. Panic disorder Acute stressors include: her mother, s/p back surgery, s/p surgery for deviated septum, work related stress, undergoing evaluation of abnormal uterine bleeding Other stressors include:father with emotional abuse, loss of maternal grandmother from COVID, paternal grandmother with alzheimer, who is in nursing facility     History: Tx from [facility]. Depression, anxiety, insomnia for many years. admitted once in 2013 for depression. Originally on Pristiq  100 mg daily, buspirone 30 mg qhs, zaleplon 20 mg qhs, Ritalin 10 mg daily   She had another episode of worsening in depressive symptoms, anxiety, SI without plan or intent in the context of work related stress.  It has been improving overall, although she reports new symptoms of hypersomnia, and slight worsening in anxiety compared to the time she was started on higher dose of fluoxetine.  On a positive note, she reports having a good relationship with her boyfriend and is considering moving in together, envisioning their future life.  Will continue current medication regimen given fluoxetine is recently up titrated.  Will continue current dose of BuSpar to target anxiety, and quetiapine adjunctive treatment for depression.  She will continue to see Ms. Pricilla Loveless for therapy.   5. Attention deficit hyperactivity disorder (ADHD), unspecified ADHD type - neuropsych testing was consistent with ADHD. UDS negative 12/2022     Overall stable.  Will continue current dose of Ritalin to target ADHD.   1. Insomnia, unspecified type - reportedly struggles with insomnia since age 20. She had CPAP study titration 07/2022.s/p surgery for deviated septum Although it is improving, she now reports new symptoms of hypersomnia in the context of menstrual cycle.  Will continue to monitor this.  Will continue current dose of zaleplon at this time to target insomnia, although the hope is to taper it off after her mood stabilizes.      Plan Continue fluoxetine 80 mg daily  Continue quetiapine 50 mg at night (on metformin) Continue buspirone 30 mg at night Continue Zaleplon 20 mg at night as needed for sleep Continue Ritalin 10 mg daily - refill left Next appointment: 4/28 at 4:30, [facility] paperwork is completed. The staff will notify the patient.  - on metoprolol   I support her having intermittent FMLA for flare-ups of her symptoms, allowing for up to 3 days per episode, with a maximum of 2 episodes per month. During  these flare-ups, she is unable to work at her full capacity.   Past trials of medication: sertraline, Pristiq, bupropion (some adverse reaction in the past), Abilify (visual change)     The patient demonstrates the following risk factors for suicide: Chronic risk factors for suicide include: psychiatric disorder of depression, anxiety. Acute risk factors for suicide include: family or marital conflict, work related stress. Protective factors for this patient include: positive social support, coping skills and hope for the future. Considering these factors, the overall suicide risk at this point appears to be moderate, but not at imminent risk. Patient is appropriate for outpatient follow up.  A total of 45 minutes was spent on the following activities during the encounter date, which includes but is not limited to: preparing to see the patient (e.g., reviewing tests and records), obtaining and/or reviewing separately obtained history, performing a medically necessary examination or evaluation, counseling and educating the patient, family, or caregiver, ordering medications, tests, or procedures, referring  and communicating with other healthcare professionals (when not reported separately), documenting clinical information in the electronic or paper health record, independently interpreting test or lab results and communicating these results to the family or caregiver, and coordinating care (when not reported separately).     Collaboration of Care: Collaboration of Care: Other reviewed notes in Epic  Patient/Guardian was advised Release of Information must be obtained prior to any record release in order to collaborate their care with an outside provider. Patient/Guardian was advised if they have not already done so to contact the registration department to sign all necessary forms in order for [place] to release information regarding their care.   Consent: Patient/Guardian gives verbal consent for treatment  and assignment of benefits for services provided during this visit. Patient/Guardian expressed understanding and agreed to proceed.    Neysa Hotter, MD 08/17/2023, 4:36 PM

## 2023-08-16 ENCOUNTER — Ambulatory Visit (INDEPENDENT_AMBULATORY_CARE_PROVIDER_SITE_OTHER): Payer: Medicaid Other | Admitting: Professional Counselor

## 2023-08-16 ENCOUNTER — Telehealth: Payer: Self-pay | Admitting: Psychiatry

## 2023-08-16 DIAGNOSIS — F411 Generalized anxiety disorder: Secondary | ICD-10-CM | POA: Diagnosis not present

## 2023-08-16 DIAGNOSIS — F331 Major depressive disorder, recurrent, moderate: Secondary | ICD-10-CM | POA: Diagnosis not present

## 2023-08-16 NOTE — Telephone Encounter (Signed)
 Patient brought in intermittent FMLA paperwork. It is in your box.

## 2023-08-16 NOTE — Progress Notes (Unsigned)
  THERAPIST PROGRESS NOTE  Session Time: 4:05 PM - 4:50 PM   Participation Level: Active  Behavioral Response: Well Groomed, Alert, Dysphoric  Type of Therapy: Individual Therapy  Treatment Goals addressed: Active OP Depression  LTG: "I'd just be able to cope with the stress because I can't."    Start:  08/16/23    Expected End:  08/14/24     STG: "Coping." To improve ability to cope AEB reduction in breakdowns/crying spells/etc. by using coping skills over the next 12 weeks   ProgressTowards Goals: Initial  Interventions: CBT, Motivational Interviewing, and Supportive  Summary: Martha [name] is a 28 y.o. female who presents with a history of anxiety and depression.  She appears somber but oriented x 5 she reported that she had to go to the ER last week due to emotional stress.  This is in addition to a previous episode that she experienced and had an emergency session with Dr. Vanetta Shawl.  Myra provided FMLA paperwork today.  She noted she received a written write up at work due to her absences.  She has a pending interview for a different position that may be helpful for her current mental health status.  Ayriel engaged in developing her treatment plan.  She actively listened to coping skills and engage in 5-4-3-2-1 grounding mechanism.  She appears motivated to engage in therapy and expressed feelings this session was helpful.  Therapist Response: Conducted session with [facility]. Began session with check-in/update since previous session. Utilized empathetic and reflective listening.  Encouraged [name] to complete FMLA paperwork to ensure safety of job.  Develop treatment plan with input from [facility] on current strengths, needs, and progress towards goals. Explained basic coping skills such as breathing exercises, TIP, and STOP skill.  Engage [name] in 5-4-3-2-1 grounding mechanism.  Encouraged [name] to practice consistently to see benefits from skills.  Scheduled additional appointment and concluded  session.   Plan: Return again in 2 weeks.  Diagnosis: MDD (major depressive disorder), recurrent episode, moderate (HCC)  Generalized anxiety disorder  Collaboration of Care: Medication Management AEB chart review  Disclaimer: Portions of this note were prepared using Dragon voice recognition software and may include unintentional dictation errors due to the inherent limitations of voice recognition software.  Patient/Guardian was advised Release of Information must be obtained prior to any record release in order to collaborate their care with an outside provider. Patient/Guardian was advised if they have not already done so to contact the registration department to sign all necessary forms in order for [place] to release information regarding their care.   Consent: Patient/Guardian gives verbal consent for treatment and assignment of benefits for services provided during this visit. Patient/Guardian expressed understanding and agreed to proceed.   Edmonia Lynch, [facility] 08/17/2023

## 2023-08-17 ENCOUNTER — Ambulatory Visit (INDEPENDENT_AMBULATORY_CARE_PROVIDER_SITE_OTHER): Payer: 59 | Admitting: Psychiatry

## 2023-08-17 ENCOUNTER — Encounter: Payer: Self-pay | Admitting: Psychiatry

## 2023-08-17 VITALS — BP 122/78 | HR 88 | Temp 98.7°F | Ht 68.0 in | Wt 314.4 lb

## 2023-08-17 DIAGNOSIS — F411 Generalized anxiety disorder: Secondary | ICD-10-CM | POA: Diagnosis not present

## 2023-08-17 DIAGNOSIS — F909 Attention-deficit hyperactivity disorder, unspecified type: Secondary | ICD-10-CM

## 2023-08-17 DIAGNOSIS — F331 Major depressive disorder, recurrent, moderate: Secondary | ICD-10-CM

## 2023-08-17 DIAGNOSIS — G4700 Insomnia, unspecified: Secondary | ICD-10-CM | POA: Diagnosis not present

## 2023-08-17 DIAGNOSIS — F410 Panic disorder [episodic paroxysmal anxiety] without agoraphobia: Secondary | ICD-10-CM

## 2023-08-17 MED ORDER — ZALEPLON 10 MG PO CAPS
20.0000 mg | ORAL_CAPSULE | Freq: Every day | ORAL | 3 refills | Status: DC
Start: 2023-08-22 — End: 2023-12-19

## 2023-08-17 MED ORDER — QUETIAPINE FUMARATE 50 MG PO TABS: 50.0000 mg | ORAL_TABLET | Freq: Every day | ORAL | 0 refills | Status: DC

## 2023-08-17 NOTE — Patient Instructions (Signed)
 Continue fluoxetine 80 mg daily  Continue quetiapine 50 mg at night  Continue buspirone 30 mg at night Continue Zaleplon 20 mg at night as needed for sleep Continue Ritalin 10 mg daily Next appointment: 4/28 at 4:30,

## 2023-08-17 NOTE — Telephone Encounter (Signed)
 The form has been completed. Could you contact the patient? She stated that she will bring it to [facility]. Please ensure that we have a ROI on file.

## 2023-08-21 ENCOUNTER — Other Ambulatory Visit: Payer: Self-pay | Admitting: Psychiatry

## 2023-08-21 ENCOUNTER — Telehealth: Payer: Self-pay

## 2023-08-21 NOTE — Telephone Encounter (Signed)
 left message that pharmacy had been called per dr. Vanetta Shawl request and have them go ahead and refill medication today. i also left a message that i would also be emailing her a release of information that needs to be signed and return so that FLMA form can be faxed.

## 2023-08-21 NOTE — Telephone Encounter (Signed)
 The record indicates that she filled Zaleplon on 10/27 and had an early refill on 11/13 for some reason, which is why the next fill date shows as tomorrow. That said, a one-day early refill is fine. Could you please ask the pharmacy to fill the medication and notify the patient? Thanks.

## 2023-08-21 NOTE — Telephone Encounter (Signed)
 pt called states that she needs her sleeping medication she states she was out over the weekend and she had not slepted. pt also states that the rx could not be filled until tomorrow. pt was last seen on 08-17-23 next appt 09-25-23

## 2023-08-21 NOTE — Telephone Encounter (Signed)
 Sent you in another message about this. Thanks.

## 2023-08-30 ENCOUNTER — Ambulatory Visit (INDEPENDENT_AMBULATORY_CARE_PROVIDER_SITE_OTHER): Payer: Medicaid Other | Admitting: Professional Counselor

## 2023-08-30 DIAGNOSIS — F411 Generalized anxiety disorder: Secondary | ICD-10-CM

## 2023-08-30 DIAGNOSIS — F331 Major depressive disorder, recurrent, moderate: Secondary | ICD-10-CM | POA: Diagnosis not present

## 2023-08-30 NOTE — Progress Notes (Unsigned)
  THERAPIST PROGRESS NOTE  Session Time: 4:00 PM - 4:48 PM  Participation Level: Active  Behavioral Response: Casual, Alert, Dysphoric  Type of Therapy: Individual Therapy  Treatment Goals addressed: Active OP Depression  LTG: "I'd just be able to cope with the stress because I can't."                Start:  08/16/23    Expected End:  08/14/24      STG: "Coping." To improve ability to cope AEB reduction in breakdowns/crying spells/etc. by using coping skills over the next 12 weeks   ProgressTowards Goals: Progressing  Interventions: CBT and Motivational Interviewing  Summary: Martha [name] is a 28 y.o. female who presents with a history of anxiety and depression. She appeared alert and oriented x5. She stated she is doing okay. She was unsure what to talk about today. She identified she can put topics in her phone for next time. She chose to review DEAR MAN skill. She engaged in practicing communication skills in session. [name] noted it will take time to practice the skills in order to be effective. She briefly discussed work and reported her FMLA was approved but they won't let her off for a requested day. She doesn't feel confident in using DEAR MAN to ask again.  Therapist Response: Conducted session with [facility]. Began session with check-in/update since previous session. Utilized empathetic and reflective listening. Provided choices for today's session and [name] chose DEAR MAN skill. Explained and modeled DEAR MAN. Used Presenter, broadcasting to practice various responses. Briefly discussed updates at work. Inquired about using today's skill to help ask for time off. Confirmed next appointment and concluded session.   Suicidal/Homicidal: No  Plan: Return again in 2 weeks.  Diagnosis: MDD (major depressive disorder), recurrent episode, moderate (HCC)  Generalized anxiety disorder  Collaboration of Care: Medication Management AEB chart review  Patient/Guardian was advised  Release of Information must be obtained prior to any record release in order to collaborate their care with an outside provider. Patient/Guardian was advised if they have not already done so to contact the registration department to sign all necessary forms in order for [place] to release information regarding their care.   Consent: Patient/Guardian gives verbal consent for treatment and assignment of benefits for services provided during this visit. Patient/Guardian expressed understanding and agreed to proceed.   Edmonia Lynch, [facility] 08/30/2023

## 2023-09-13 ENCOUNTER — Ambulatory Visit (INDEPENDENT_AMBULATORY_CARE_PROVIDER_SITE_OTHER): Payer: Medicaid Other | Admitting: Professional Counselor

## 2023-09-13 DIAGNOSIS — F439 Reaction to severe stress, unspecified: Secondary | ICD-10-CM | POA: Diagnosis not present

## 2023-09-13 NOTE — Progress Notes (Signed)
  THERAPIST PROGRESS NOTE  Session Time: 3:55 PM - 4:38 PM   Participation Level: Active  Behavioral Response: Well Groomed, Alert, Dysphoric  Type of Therapy: Individual Therapy  Treatment Goals addressed: Active OP Depression  LTG: "I'd just be able to cope with the stress because I can't."                Start:  08/16/23    Expected End:  08/14/24      STG: "Coping." To improve ability to cope AEB reduction in breakdowns/crying spells/etc. by using coping skills over the next 12 weeks    ProgressTowards Goals: Progressing  Interventions: CBT  Summary: Martha [name] is a 28 y.o. female who presents with a history of anxiety and depression. She appeared alert and oriented x5. She noted she is having dental work tomorrow (root canal) and will be out of work the rest of the week. Liala inquired about possible PTSD. She scored a 63 on PCL screening today. She was receptive to beginning CPT. She actively listened to psychoeducation and expressed understanding about homework assignment - impact statement.   Therapist Response: Conducted session with [facility]. Began session with check-in/update since previous session. Utilized empathetic and reflective listening. Administered PCL screening. Discussed option for CPT. Provided psychoeducation on PTSD and explained stuck points. Explained homework assignment - impact statement - and provided Brunilda with workbook. Scheduled additional appointments and concluded session.   Suicidal/Homicidal: No  Plan: Return again in 2 weeks.  Diagnosis: Trauma and stressor-related disorder  Collaboration of Care: Medication Management AEB chart review  Patient/Guardian was advised Release of Information must be obtained prior to any record release in order to collaborate their care with an outside provider. Patient/Guardian was advised if they have not already done so to contact the registration department to sign all necessary forms in order for us  to release  information regarding their care.   Consent: Patient/Guardian gives verbal consent for treatment and assignment of benefits for services provided during this visit. Patient/Guardian expressed understanding and agreed to proceed.   Len Quale, [facility] 09/13/2023

## 2023-09-22 NOTE — Progress Notes (Signed)
 [facility] MD/PA/NP OP Progress Note  09/25/2023 5:27 PM Martha [name]  MRN:  [id number]  Chief Complaint:  Chief Complaint  Patient presents with   Follow-up   HPI:  This is a follow-up appointment for depression, anxiety, ADHD and insomnia.  She states that she has been doing all right.  She states that she was informed she has complex PTSD.  She thinks it makes more sense.  Although she denies any history of abuse, she describes a childhood characterized by receiving material items, such as toys, but lacking emotional nurturing. She was told to lose weight by her mother when she was a child.  She attributes her not moving in the bed to her childhood of doing so that her parents would not wake up. She has significant anxiety when there is an issue with shower being clogged as she was afraid of her father.  Although she stays in the living room with her mother, both are primarily focused on their phones.  Although her mother was supported when she had a panic attack, her mother also asked her what Taura wants her to do.  She has flashback and nightmares of fears of abandonment.  She has hypervigilance.  She has spells of feeling "dread tired."  She denies panic attacks.  She denies SI.  Her focus has been manageable.  She denies alcohol use or drug use.  She feels comfortable to stay on the current medication regimen while working through therapy.   Wt Readings from Last 3 Encounters:  09/25/23 (!) 306 lb (138.8 kg)  08/17/23 (!) 314 lb 6.4 oz (142.6 kg)  08/03/23 (!) 326 lb (147.9 kg)     Visit Diagnosis:    ICD-10-CM   1. MDD (major depressive disorder), recurrent episode, moderate (HCC)  F33.1     2. Generalized anxiety disorder  F41.1     3. Panic disorder  F41.0     4. Attention deficit hyperactivity disorder (ADHD), unspecified ADHD type  F90.9     5. Insomnia, unspecified type  G47.00       Past Psychiatric History: Please see initial evaluation for full details. I have reviewed the  history. No updates at this time.     Past Medical History:  Past Medical History:  Diagnosis Date   ADHD (attention deficit hyperactivity disorder)    no meds taken   Allergy    Anxiety    Complication of anesthesia    woke up during colonoscopy and dental surgery.   Depression    Diabetes mellitus    pre-diabetic,told when younger   Family history of breast cancer    12/20 cancer genetic testing letter sent   Gastritis    GERD (gastroesophageal reflux disease)    Hashimoto's disease    Headache(784.0)    chronic migraines, decrease since BCP started   History of hiatal hernia    Hypertension    Idiopathic intracranial hypertension    Insomnia    Irregular menstrual cycle    pt states she started BCP,unsure of name to help regulate cycle   Obesity    Sleep apnea    Vision abnormalities    near sighted,wears glasses    Past Surgical History:  Procedure Laterality Date   CHOLECYSTECTOMY     COLONOSCOPY WITH PROPOFOL  N/A 12/03/2021   Procedure: COLONOSCOPY WITH PROPOFOL ;  Surgeon: Shane Darling, MD;  Location: [facility];  Service: Endoscopy;  Laterality: N/A;   ENDOSCOPIC CONCHA BULLOSA RESECTION Right 10/12/2022   Procedure:  ENDOSCOPIC CONCHA BULLOSA RESECTION;  Surgeon: Mellody Sprout, MD;  Location: [facility] OR[facility];  Service: ENT;  Laterality: Right;   ESOPHAGOGASTRODUODENOSCOPY (EGD) WITH PROPOFOL  N/A 12/03/2021   Procedure: ESOPHAGOGASTRODUODENOSCOPY (EGD) WITH PROPOFOL ;  Surgeon: Shane Darling, MD;  Location: [facility];  Service: Endoscopy;  Laterality: N/A;  DM   ETHMOIDECTOMY Bilateral 10/12/2022   Procedure: TOTAL ETHMOIDECTOMY;  Surgeon: Mellody Sprout, MD;  Location: [facility] OR[facility];  Service: ENT;  Laterality: Bilateral;   IMAGE GUIDED SINUS SURGERY Bilateral 10/12/2022   Procedure: IMAGE GUIDED SINUS SURGERY;  Surgeon: Mellody Sprout, MD;  Location: [facility];  Service: ENT;  Laterality: Bilateral;   SEPTOPLASTY N/A 10/12/2022   Procedure: SEPTOPLASTY;  Surgeon:  Mellody Sprout, MD;  Location: [facility];  Service: ENT;  Laterality: N/A;   TONSILLECTOMY     at 28yo   UPPER GI ENDOSCOPY  01/2012    Family Psychiatric History: Please see initial evaluation for full details. I have reviewed the history. No updates at this time.     Family History:  Family History  Problem Relation Age of Onset   Thyroid  cancer Mother 47   Depression Mother    Hypertension Father    Anxiety disorder Father    Diabetes type II Father    Skin cancer Maternal Grandfather    Alzheimer's disease Maternal Grandfather    Alzheimer's disease Paternal Grandmother    Leukemia Paternal Aunt    Breast cancer Paternal Aunt 40   Hypertension Maternal Grandmother    Diabetes type II Maternal Grandmother     Social History:  Social History   Socioeconomic History   Marital status: Single    Spouse name: Not on file   Number of children: Not on file   Years of education: Not on file   Highest education level: Not on file  Occupational History   Occupation: Consulting civil engineer    Comment: [employer] 12th grade  Tobacco Use   Smoking status: Never   Smokeless tobacco: Never  Vaping Use   Vaping status: Former   Quit date: 10/30/2018  Substance and Sexual Activity   Alcohol use: Yes    Comment: occ   Drug use: No   Sexual activity: Not Currently    Partners: Male    Birth control/protection: Pill  Other Topics Concern   Not on file  Social History Narrative   Not on file   Social Drivers of Health   Financial Resource Strain: Low Risk  (07/12/2023)   Overall Financial Resource Strain (CARDIA)    Difficulty of Paying Living Expenses: Not hard at all  Food Insecurity: No Food Insecurity (07/12/2023)   Hunger Vital Sign    Worried About Running Out of Food in the Last Year: Never true    Ran Out of Food in the Last Year: Never true  Transportation Needs: No Transportation Needs (07/12/2023)   PRAPARE - Administrator, Civil Service (Medical): No    Lack  of Transportation (Non-Medical): No  Physical Activity: Insufficiently Active (07/12/2023)   Exercise Vital Sign    Days of Exercise per Week: 5 days    Minutes of Exercise per Session: 10 min  Stress: Stress Concern Present (07/12/2023)   [employer] of Occupational Health - Occupational Stress Questionnaire    Feeling of Stress : To some extent  Social Connections: Moderately Isolated (07/12/2023)   Social Connection and Isolation Panel [NHANES]    Frequency of Communication with Friends and Family: More than three times a week  Frequency of Social Gatherings with Friends and Family: Once a week    Attends Religious Services: More than 4 times per year    Active Member of [employer] or Organizations: No    Attends Banker Meetings: Never    Marital Status: Never married    Allergies:  Allergies  Allergen Reactions   Doxycycline Other (See Comments)   Lamisil [Terbinafine]    Wellbutrin  [Bupropion ] Other (See Comments)    Suicidal Ideation    Wound Dressing Adhesive Hives    "I welp up"    Metabolic Disorder Labs: Lab Results  Component Value Date   HGBA1C 6.5 (H) 04/19/2021   No results found for: "PROLACTIN" Lab Results  Component Value Date   CHOL 151 04/20/2020   TRIG 136 04/20/2020   HDL 40 04/20/2020   CHOLHDL 3.8 04/20/2020   LDLCALC 87 04/20/2020   Lab Results  Component Value Date   TSH 3.216 03/01/2023   TSH 6.670 (H) 04/20/2020    Therapeutic Level Labs: No results found for: "LITHIUM" No results found for: "VALPROATE" No results found for: "CBMZ"  Current Medications: Current Outpatient Medications  Medication Sig Dispense Refill   ascorbic acid (VITAMIN C) 1000 MG tablet Take 1,000 mg by mouth daily.     busPIRone  (BUSPAR ) 30 MG tablet Take 1 tablet (30 mg total) by mouth at bedtime. 90 tablet 1   Cholecalciferol (VITAMIN D -1000 MAX ST) 25 MCG (1000 UT) tablet Take 1,000 Units by mouth daily.     FLUoxetine  (PROZAC ) 40 MG capsule  Take 1 capsule (40 mg total) by mouth daily. Total of 60 mg daily. Take along with 20 mg cap (Patient taking differently: Take 80 mg by mouth daily. Total of 60 mg daily. Take along with 20 mg cap) 90 capsule 1   levothyroxine (SYNTHROID) 75 MCG tablet Take 75 mcg by mouth daily before breakfast.     magnesium oxide (MAG-OX) 400 MG tablet Take 400 mg by mouth daily.     metoprolol tartrate (LOPRESSOR) 50 MG tablet Take 50 mg by mouth 2 (two) times daily.     Multiple Vitamin (MULTI-VITAMIN) tablet Take by mouth.     Multiple Vitamins-Minerals (ZINC  PO) Take 1 tablet by mouth daily.     norethindrone  (MICRONOR ) 0.35 MG tablet Take 1 tablet by mouth daily.     pantoprazole (PROTONIX) 40 MG tablet Take 40 mg by mouth at bedtime.     promethazine (PHENERGAN) 12.5 MG tablet Take 12.5 mg by mouth every 6 (six) hours as needed for nausea or vomiting.     QUEtiapine  (SEROQUEL ) 50 MG tablet Take 1 tablet (50 mg total) by mouth at bedtime. 90 tablet 0   spironolactone (ALDACTONE) 100 MG tablet Take 200 mg by mouth daily.  9   tizanidine (ZANAFLEX) 2 MG capsule Take 2 mg by mouth at bedtime as needed for muscle spasms.     Ubrogepant (UBRELVY PO) Take 1 tablet by mouth as needed.     vitamin B-12 (CYANOCOBALAMIN) 1000 MCG tablet Take 1,000 mcg by mouth daily.     zaleplon  (SONATA ) 10 MG capsule Take 2 capsules (20 mg total) by mouth at bedtime. 60 capsule 3   calcium carbonate (OSCAL) 1500 (600 Ca) MG TABS tablet Take 600 mg of elemental calcium by mouth daily with breakfast.     FLUoxetine  (PROZAC ) 20 MG capsule Take 1 capsule (20 mg total) by mouth daily. Total of 60 mg daily. Take along with 40 mg cap (Patient not  taking: Reported on 09/25/2023) 90 capsule 0   hydrochlorothiazide (MICROZIDE) 12.5 MG capsule Take 12.5 mg by mouth daily. (Patient not taking: Reported on 09/25/2023)     metFORMIN (GLUCOPHAGE) 500 MG tablet Take 250 mg by mouth at bedtime as needed.     methylphenidate  (RITALIN ) 10 MG tablet  Take 1 tablet (10 mg total) by mouth daily before breakfast. 30 tablet 0   [START ON 10/25/2023] methylphenidate  (RITALIN ) 10 MG tablet Take 1 tablet (10 mg total) by mouth daily. 30 tablet 0   No current facility-administered medications for this visit.     Musculoskeletal: Strength & Muscle Tone: within normal limits Gait & Station: normal Patient leans: N/A  Psychiatric Specialty Exam: Review of Systems  Psychiatric/Behavioral:  Positive for dysphoric mood and sleep disturbance. Negative for agitation, behavioral problems, confusion, decreased concentration, hallucinations, self-injury and suicidal ideas. The patient is nervous/anxious. The patient is not hyperactive.   All other systems reviewed and are negative.   Blood pressure 121/84, pulse 77, temperature 97.6 F (36.4 C), temperature source Skin, height 5\' 8"  (1.727 m), weight (!) 306 lb (138.8 kg).Body mass index is 46.53 kg/m.  General Appearance: Well Groomed  Eye Contact:  Good  Speech:  Clear and Coherent  Volume:  Normal  Mood:  Anxious  Affect:  Appropriate, Congruent, and Full Range  Thought Process:  Coherent  Orientation:  Full (Time, Place, and Person)  Thought Content: Logical   Suicidal Thoughts:  No  Homicidal Thoughts:  No  Memory:  Immediate;   Good  Judgement:  Good  Insight:  Good  Psychomotor Activity:  Normal  Concentration:  Concentration: Good and Attention Span: Good  Recall:  Good  Fund of Knowledge: Good  Language: Good  Akathisia:  No  Handed:  Right  AIMS (if indicated): not done  Assets:  Communication Skills Desire for Improvement  ADL's:  Intact  Cognition: WNL  Sleep:  Poor   Screenings: GAD-7    Advertising copywriter from 07/12/2023 in [facility] Office Visit from 06/08/2023 in [facility] Visit from 02/27/2023 in [facility] Visit from  12/27/2022 in [facility] Visit from 10/20/2022 in [facility]  Total GAD-7 Score 6 7 14 14 7       PHQ2-9    Flowsheet Row Office Visit from 09/25/2023 in [facility] Counselor from 07/12/2023 in [facility] Visit from 06/08/2023 in [facility] Office Visit from 02/27/2023 in [facility] Visit from 12/27/2022 in [facility]  PHQ-2 Total Score 2 2 2 4 4   PHQ-9 Total Score 9 9 9 17 17       Flowsheet Row Office Visit from 09/25/2023 in [facility] ED from 08/03/2023 in [facility] Counselor from 07/12/2023 in [facility]  C-SSRS RISK CATEGORY Error: Q3, 4, or 5 should not be populated when Q2 is No No Risk Moderate Risk        Assessment and Plan:  Martha [name] is a 28 y.o. year old female with a history of depression, anxiety, ADHD by history, migraine, GERD, who presents for follow up appointment for below.    1. MDD (major depressive disorder), recurrent episode, moderate (HCC) 2.  Generalized anxiety disorder 3. Panic disorder Acute stressors include: her mother, s/p back surgery, s/p surgery for deviated septum, work related stress, undergoing evaluation of abnormal uterine bleeding Other stressors include:father with emotional abuse, loss of maternal grandmother from COVID, paternal grandmother with alzheimer, who is in nursing facility     History: Tx from [facility]. Depression, anxiety, insomnia for many years. admitted once in 2013 for depression. Originally on Pristiq  100 mg daily, buspirone  30 mg qhs, zaleplon  20 mg qhs, Ritalin  10 mg daily   She reports overall  improvement in depressive symptoms and anxiety, SI since her last visit.  She now recognizes symptoms of PTSD related to her upbringing, including a lack of emotional support from her parents.  On a positive note, she reports having a good relationship with her boyfriend and is considering moving in together, envisioning their future life.  She is willing to work through therapy, and feels comfortable to stay on the current medication regimen.  Will continue current dose of fluoxetine  to target PTSD symptoms, depression and anxiety.  Will continue BuSpar  for anxiety, and quetiapine  adjunctive treatment for depression.  She will continue to see Ms. Deetta Farrow for therapy.   4. Attention deficit hyperactivity disorder (ADHD), unspecified ADHD type - neuropsych testing was consistent with ADHD. UDS negative 12/2022     Overall manageable.  Will continue current dose of Ritalin  to target ADHD.   5. Insomnia, unspecified type - reportedly struggles with insomnia since age 55. She had CPAP study titration 07/2022.s/p surgery for deviated septum She has nightmares associated with her childhood, and especially struggles with hypersomnia diagnosable cycle.  Will consider adding prazosin if any worsening.  Will continue current dose of zaleplon  to target insomnia, although the hope is to use this only for short term.   Plan Continue fluoxetine  80 mg daily  Continue quetiapine  50 mg at night (on metformin) Continue buspirone  30 mg at night Continue Zaleplon  20 mg at night as needed for sleep - refill left Continue Ritalin  10 mg daily  Next appointment: 6/12 at 4:30, IP - on metoprolol    I support her having intermittent FMLA for flare-ups of her symptoms, allowing for up to 3 days per episode, with a maximum of 2 episodes per month. During these flare-ups, she is unable to work at her full capacity.   Past trials of medication: sertraline, Pristiq , bupropion  (some adverse reaction in the past), Abilify (visual  change)     The patient demonstrates the following risk factors for suicide: Chronic risk factors for suicide include: psychiatric disorder of depression, anxiety. Acute risk factors for suicide include: family or marital conflict, work related stress. Protective factors for this patient include: positive social support, coping skills and hope for the future. Considering these factors, the overall suicide risk at this point appears to be moderate, but not at imminent risk. Patient is appropriate for outpatient follow up.    Collaboration of Care: Collaboration of Care: Other reviewed notes in Epic  Patient/Guardian was advised Release of Information must be obtained prior to any record release in order to collaborate their care with an outside provider. Patient/Guardian was advised if they have not already done so to contact the registration department to sign all necessary forms in order for us  to release information regarding their care.   Consent: Patient/Guardian gives verbal consent for treatment and assignment of benefits for services provided during this visit. Patient/Guardian expressed understanding and agreed to proceed.    Todd Fossa, MD 09/25/2023, 5:27  PM

## 2023-09-25 ENCOUNTER — Other Ambulatory Visit: Payer: Self-pay

## 2023-09-25 ENCOUNTER — Ambulatory Visit (INDEPENDENT_AMBULATORY_CARE_PROVIDER_SITE_OTHER): Admitting: Psychiatry

## 2023-09-25 ENCOUNTER — Encounter: Payer: Self-pay | Admitting: Psychiatry

## 2023-09-25 VITALS — BP 121/84 | HR 77 | Temp 97.6°F | Ht 68.0 in | Wt 306.0 lb

## 2023-09-25 DIAGNOSIS — F411 Generalized anxiety disorder: Secondary | ICD-10-CM

## 2023-09-25 DIAGNOSIS — G4700 Insomnia, unspecified: Secondary | ICD-10-CM

## 2023-09-25 DIAGNOSIS — F909 Attention-deficit hyperactivity disorder, unspecified type: Secondary | ICD-10-CM | POA: Diagnosis not present

## 2023-09-25 DIAGNOSIS — F410 Panic disorder [episodic paroxysmal anxiety] without agoraphobia: Secondary | ICD-10-CM | POA: Diagnosis not present

## 2023-09-25 DIAGNOSIS — F331 Major depressive disorder, recurrent, moderate: Secondary | ICD-10-CM | POA: Diagnosis not present

## 2023-09-25 MED ORDER — METHYLPHENIDATE HCL 10 MG PO TABS: 10.0000 mg | ORAL_TABLET | Freq: Every day | ORAL | 0 refills | Status: DC

## 2023-09-25 NOTE — Patient Instructions (Signed)
 Continue fluoxetine  80 mg daily  Continue quetiapine  50 mg at night  Continue buspirone  30 mg at night Continue Zaleplon  20 mg at night as needed for sleep  Continue Ritalin  10 mg daily  Next appointment: 6/12 at 4:30

## 2023-09-28 ENCOUNTER — Ambulatory Visit (INDEPENDENT_AMBULATORY_CARE_PROVIDER_SITE_OTHER): Admitting: Professional Counselor

## 2023-09-28 DIAGNOSIS — F439 Reaction to severe stress, unspecified: Secondary | ICD-10-CM

## 2023-09-28 NOTE — Progress Notes (Signed)
  THERAPIST PROGRESS NOTE  Session Time: 4:05 PM - 4:42 PM   Participation Level: Active  Behavioral Response: Well Groomed, Alert, Dysphoric  Type of Therapy: Individual Therapy  Treatment Goals addressed: Active OP Depression  LTG: "I'd just be able to cope with the stress because I can't."                Start:  08/16/23    Expected End:  08/14/24      STG: "Coping." To improve ability to cope AEB reduction in breakdowns/crying spells/etc. by using coping skills over the next 12 weeks   ProgressTowards Goals: Progressing  Interventions: CBT  Summary: Martha [name] is a 29 y.o. female who presents with a history of anxiety and depression and PTSD. She appeared alert and oriented x5. She scored a 64 on PCL screening. She read her impact statement and discussed how completing the assignment was for her. She actively listened to review of stuck points and psychoeducation on natural vs manufactured emotions. She engaged in practicing an ABC worksheet. She expressed understanding about homework assignment.   Therapist Response: Conducted session with [facility]. Began session with check-in/update since previous session. Utilized empathetic and reflective listening. Administered PCL weekly.  Actively listened as [name] read impact statement. Assisted with identifying stuck points. Provided psychoeducation on spectrum of emotions and natural vs manufactured emotions. Explained ABC worksheet and completed one in session. Assigned ABC worksheets for homework. Scheduled additional appointment and concluded session.   Suicidal/Homicidal: No  Plan: Return again in 2 weeks.  Diagnosis: Trauma and stressor-related disorder  Collaboration of Care: Medication Management AEB chart review  Patient/Guardian was advised Release of Information must be obtained prior to any record release in order to collaborate their care with an outside provider. Patient/Guardian was advised if they have not already done so  to contact the registration department to sign all necessary forms in order for us  to release information regarding their care.   Consent: Patient/Guardian gives verbal consent for treatment and assignment of benefits for services provided during this visit. Patient/Guardian expressed understanding and agreed to proceed.   Len Quale, [facility] 09/28/2023

## 2023-10-09 ENCOUNTER — Ambulatory Visit (INDEPENDENT_AMBULATORY_CARE_PROVIDER_SITE_OTHER): Admitting: Professional Counselor

## 2023-10-09 DIAGNOSIS — F439 Reaction to severe stress, unspecified: Secondary | ICD-10-CM | POA: Diagnosis not present

## 2023-10-09 NOTE — Progress Notes (Unsigned)
  THERAPIST PROGRESS NOTE  Session Time: 1:00 PM - 1:54 PM  Participation Level: {BHH PARTICIPATION LEVEL:22264}  Behavioral Response: {Appearance:22683}{BHH LEVEL OF CONSCIOUSNESS:22305}{BHH MOOD:22306}  Type of Therapy: {CHL AMB BH Type of Therapy:21022741}  Treatment Goals addressed: ***  ProgressTowards Goals: {Progress Towards Goals:21014066}  Interventions: {CHL AMB BH Type of Intervention:21022753}  Summary: Martha [name] is a 28 y.o. female who presents with ***. PCL 64 mom's health, fmla today, abc worksheets  Suicidal/Homicidal: {BHH YES OR NO:22294}{yes/no/with/without intent/plan:22693}  Therapist Response: ***  Plan: Return again in *** weeks.  Diagnosis: No diagnosis found.  Collaboration of Care: {BH OP Collaboration of Care:21014065}  Patient/Guardian was advised Release of Information must be obtained prior to any record release in order to collaborate their care with an outside provider. Patient/Guardian was advised if they have not already done so to contact the registration department to sign all necessary forms in order for us  to release information regarding their care.   Consent: Patient/Guardian gives verbal consent for treatment and assignment of benefits for services provided during this visit. Patient/Guardian expressed understanding and agreed to proceed.   Len Quale, [facility] 10/09/2023

## 2023-10-17 ENCOUNTER — Emergency Department

## 2023-10-17 ENCOUNTER — Encounter (INDEPENDENT_AMBULATORY_CARE_PROVIDER_SITE_OTHER): Payer: Self-pay

## 2023-10-17 ENCOUNTER — Emergency Department
Admission: EM | Admit: 2023-10-17 | Discharge: 2023-10-17 | Disposition: A | Discharge status: Home / Self Care | Attending: Emergency Medicine | Admitting: Emergency Medicine

## 2023-10-17 ENCOUNTER — Other Ambulatory Visit: Payer: Self-pay

## 2023-10-17 DIAGNOSIS — R079 Chest pain, unspecified: Secondary | ICD-10-CM | POA: Diagnosis present

## 2023-10-17 LAB — BASIC METABOLIC PANEL WITH GFR
Anion gap: 12 (ref 5–15)
BUN: 9 mg/dL (ref 6–20)
CO2: 27 mmol/L (ref 22–32)
Calcium: 9.4 mg/dL (ref 8.9–10.3)
Chloride: 98 mmol/L (ref 98–111)
Creatinine, Ser: 0.81 mg/dL (ref 0.44–1.00)
GFR, Estimated: >60 mL/min (ref >60)
Glucose, Bld: 90 mg/dL (ref 70–99)
Potassium: 3.8 mmol/L (ref 3.5–5.1)
Sodium: 137 mmol/L (ref 135–145)

## 2023-10-17 LAB — CBC
HCT: 39.3 % (ref 36.0–46.0)
Hemoglobin: 12.5 g/dL (ref 12.0–15.0)
MCH: 27.8 pg (ref 26.0–34.0)
MCHC: 31.8 g/dL (ref 30.0–36.0)
MCV: 87.3 fL (ref 80.0–100.0)
Platelets: 386 10*3/uL (ref 150–400)
RBC: 4.5 MIL/uL (ref 3.87–5.11)
RDW: 13.9 % (ref 11.5–15.5)
WBC: 10.6 10*3/uL — ABNORMAL HIGH (ref 4.0–10.5)
nRBC: 0 % (ref 0.0–0.2)

## 2023-10-17 LAB — TROPONIN I (HIGH SENSITIVITY)
Troponin I (High Sensitivity): <2 ng/L (ref <18)
Troponin I (High Sensitivity): <2 ng/L (ref <18)

## 2023-10-17 LAB — D-DIMER, QUANTITATIVE: D-Dimer, Quant: <0.27 ug{FEU}/mL (ref 0.00–0.50)

## 2023-10-17 NOTE — ED Notes (Signed)
 First nurse note: From [facility] for L chest pain since 1000 today, radiates to L shoulder, stabbing. Also throat feels tight and feels lethargic.  BP 100/50 (takes metoprolol), HR 78, 98% RA, 98.1 oral

## 2023-10-17 NOTE — ED Provider Notes (Signed)
 [facility] Provider Note    Event Date/Time   First MD Initiated Contact with Patient 10/17/23 1530     (approximate)   History   Chest Pain   HPI  Martha [name] is a 28 y.o. female with a past medical history of major depressive disorder, anxiety, GERD, morbid obesity who presents today for evaluation of chest pain.  Patient reports that this occurred this morning at approximately 9 AM while she was working, she was sitting down at the time.  She reports that the pain felt mostly left-sided and radiated to her scapula.  No shortness of breath.  No nausea or vomiting.  She reports that the lasted a couple minutes and has gone away.  She reports that she has had similar episodes in the past with her anxiety.  Currently no symptoms.  Patient Active Problem List   Diagnosis Date Noted   MDD (major depressive disorder), recurrent episode, moderate (HCC) 08/03/2023   Hopelessness 08/03/2023   Migraine headache 10/07/2021   Oropharyngeal dysphagia 09/25/2021   FH: colon polyps 09/25/2021   Chronic diarrhea of unknown origin 09/25/2021   Recurrent major depressive disorder, in partial remission (HCC) 02/04/2021   Edema of both legs 02/04/2021   Seasonal allergic rhinitis due to pollen 08/28/2018   Hiatal hernia 10/07/2015   GERD without esophagitis 07/06/2015   White coat syndrome without diagnosis of hypertension 07/06/2015   Morbid obesity (HCC) 02/10/2011          Physical Exam   Triage Vital Signs: ED Triage Vitals  Encounter Vitals Group     BP 10/17/23 1303 120/81     Systolic BP Percentile --      Diastolic BP Percentile --      Pulse Rate 10/17/23 1303 62     Resp 10/17/23 1303 16     Temp 10/17/23 1303 98.3 F (36.8 C)     Temp Source 10/17/23 1303 Oral     SpO2 10/17/23 1303 99 %     Weight 10/17/23 1257 (!) 304 lb (137.9 kg)     Height 10/17/23 1257 5\' 8"  (1.727 m)     Head Circumference --      Peak Flow --      Pain Score  10/17/23 1257 6     Pain Loc --      Pain Education --      Exclude from Growth Chart --     Most recent vital signs: Vitals:   10/17/23 1303 10/17/23 1730  BP: 120/81 108/70  Pulse: 62 70  Resp: 16 18  Temp: 98.3 F (36.8 C) 98.4 F (36.9 C)  SpO2: 99% 99%    Physical Exam Vitals and nursing note reviewed.  Constitutional:      General: Awake and alert. No acute distress.    Appearance: Normal appearance.  HENT:     Head: Normocephalic and atraumatic.     Mouth: Mucous membranes are moist.  Eyes:     General: PERRL. Normal EOMs        Right eye: No discharge.        Left eye: No discharge.     Conjunctiva/sclera: Conjunctivae normal.  Cardiovascular:     Rate and Rhythm: Normal rate and regular rhythm.     Pulses: Normal pulses.  Pulmonary:     Effort: Pulmonary effort is normal. No respiratory distress.     Breath sounds: Normal breath sounds.  Abdominal:     Abdomen is soft.  There is no abdominal tenderness. No rebound or guarding. No distention. Musculoskeletal:        General: No swelling. Normal range of motion.     Cervical back: Normal range of motion and neck supple.  Skin:    General: Skin is warm and dry.     Capillary Refill: Capillary refill takes less than 2 seconds.     Findings: No rash.  Neurological:     Mental Status: The patient is awake and alert.      ED Results / Procedures / Treatments   Labs (all labs ordered are listed, but only abnormal results are displayed) Labs Reviewed  CBC - Abnormal; Notable for the following components:      Result Value   WBC 10.6 (*)    All other components within normal limits  BASIC METABOLIC PANEL WITH GFR  D-DIMER, QUANTITATIVE  POC URINE PREG, ED  TROPONIN I (HIGH SENSITIVITY)  TROPONIN I (HIGH SENSITIVITY)     EKG     RADIOLOGY I independently reviewed and interpreted imaging and agree with radiologists findings.     PROCEDURES:  Critical Care performed:    Procedures   MEDICATIONS ORDERED IN ED: Medications - No data to display   IMPRESSION / MDM / ASSESSMENT AND PLAN / ED COURSE  I reviewed the triage vital signs and the nursing notes.   Differential diagnosis includes, but is not limited to, anxiety, cardiac ischemia, pulmonary embolism, pneumothorax, chest wall pain.  Patient is awake and alert, hemodynamically stable and afebrile.  She is nontoxic in appearance.  I have personally reviewed and interpreted the EKG x2. HEART Score 1 (obesity and DM) for MACE in 6 weeks.  Patient is on OCPs, therefore cannot PERC her out.  D-dimer is negative however, unlikely pulmonary embolism.  Her OCP is progesterone only.  VSS no hypoxia. Overall well-appearing. No ST/PR changes to suggest pericarditis.  Troponin is negative x 2.  No pneumothorax, normal mediastinal width. Suspect anxiety, GI or MSK etiology as likely cause. Discussed care plan, return precautions, and advised close outpatient follow-up. Patient agrees with plan of care.    Patient's presentation is most consistent with acute presentation with potential threat to life or bodily function.   Clinical Course as of 10/17/23 1916  Tue Oct 17, 2023  1750 Upon reevaluation, patient reports that she feels significantly improved and will be discharged home [JP]    Clinical Course User Index [JP] Vayden Weinand E, PA-C     FINAL CLINICAL IMPRESSION(S) / ED DIAGNOSES   Final diagnoses:  Nonspecific chest pain     Rx / DC Orders   ED Discharge Orders     None        Note:  This document was prepared using Dragon voice recognition software and may include unintentional dictation errors.   Anaiyah Anglemyer E, PA-C 10/17/23 1916    Lubertha Rush, MD 10/21/23 [phone]

## 2023-10-17 NOTE — Discharge Instructions (Signed)
 Your blood test, chest x-ray were normal.  Please follow-up with your outpatient provider.  Please return for any new, worsening, or change in symptoms or other concerns.  It was a pleasure caring for you today.

## 2023-10-17 NOTE — ED Notes (Signed)
 See triage notes. Patient c/o chest and left shoulder pain that began at work today. Patient has a hx of anxiety but states she doesn't feel any different with that. Patient has family hx of cardiac related issues. Patient is currently on a beta blocker.

## 2023-10-17 NOTE — ED Triage Notes (Signed)
 Pt sts that she was at work when she started to get chest pain. Pt sts that she does have anxiety and stress however she doesn't feel any different with that.

## 2023-10-26 ENCOUNTER — Ambulatory Visit (INDEPENDENT_AMBULATORY_CARE_PROVIDER_SITE_OTHER): Admitting: Professional Counselor

## 2023-10-26 DIAGNOSIS — F439 Reaction to severe stress, unspecified: Secondary | ICD-10-CM

## 2023-10-26 DIAGNOSIS — F411 Generalized anxiety disorder: Secondary | ICD-10-CM | POA: Diagnosis not present

## 2023-10-26 DIAGNOSIS — F331 Major depressive disorder, recurrent, moderate: Secondary | ICD-10-CM

## 2023-10-26 NOTE — Progress Notes (Signed)
  THERAPIST PROGRESS NOTE  Session Time: 10:01 AM - 10:54 AM   Participation Level: Active  Behavioral Response: Well Groomed, Alert, Dysphoric  Type of Therapy: Individual Therapy  Treatment Goals addressed:  Active OP Depression  LTG: "I'd just be able to cope with the stress because I can't."                Start:  08/16/23    Expected End:  08/14/24      STG: "Coping." To improve ability to cope AEB reduction in breakdowns/crying spells/etc. by using coping skills over the next 12 weeks   ProgressTowards Goals: Progressing  Interventions: CBT, Motivational Interviewing, and Supportive  Summary: Martha [name] is a 28 y.o. female who presents with a history of anxiety and depression and PTSD. She appeared alert and oriented x5. She stated she had another health scare and was in the ER for chest pains. [name] reported they stated it wasn't a heart attack or blood clot but she wasn't really told what it was. She noted anxiety was listed as a possible factor. [name] scored 67 on PCL screening. She shared her completed ABC worksheets and engaged in challenging her negative thoughts during session. [name] actively listened and completed challenging questions worksheet with the the belief "What Mom and Dad say is law." She expressed understanding about homework assignment.   Therapist Response: Conducted session with [facility]. Began session with check-in/update since previous session. Utilized empathetic and reflective listening. Administered PCL screening. Reviewed completed ABC worksheets. Used Socratic questioning to challenge negative thoughts/beliefs. Explained challenging questions worksheet and completed one in session. Assigned additional for homework. Scheduled additional appointment and concluded session.   Suicidal/Homicidal: No  Plan: Return again in 2 weeks.  Diagnosis: Trauma and stressor-related disorder  MDD (major depressive disorder), recurrent episode, moderate  (HCC)  Generalized anxiety disorder  Collaboration of Care: Medication Management AEB chart review  Patient/Guardian was advised Release of Information must be obtained prior to any record release in order to collaborate their care with an outside provider. Patient/Guardian was advised if they have not already done so to contact the registration department to sign all necessary forms in order for us  to release information regarding their care.   Consent: Patient/Guardian gives verbal consent for treatment and assignment of benefits for services provided during this visit. Patient/Guardian expressed understanding and agreed to proceed.   Len Quale, [facility] 10/26/2023

## 2023-10-31 ENCOUNTER — Telehealth: Payer: Self-pay

## 2023-10-31 ENCOUNTER — Other Ambulatory Visit: Payer: Self-pay | Admitting: Psychiatry

## 2023-10-31 MED ORDER — FLUOXETINE HCL 40 MG PO CAPS: 80.0000 mg | ORAL_CAPSULE | Freq: Every day | ORAL | 0 refills | Status: DC

## 2023-10-31 NOTE — Telephone Encounter (Signed)
 pt called states that she take fluoxetine  80mg  she needs a new rx with the correct dosage she only has 3 days left. pt states she take 2 40mg  pills. pt was last seen on 4-28 next appt 6-12

## 2023-10-31 NOTE — Telephone Encounter (Signed)
 left message that rx has been sent to the pharmacy

## 2023-10-31 NOTE — Telephone Encounter (Signed)
 Ordered
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
## 2023-11-08 ENCOUNTER — Ambulatory Visit (INDEPENDENT_AMBULATORY_CARE_PROVIDER_SITE_OTHER): Admitting: Professional Counselor

## 2023-11-08 DIAGNOSIS — F331 Major depressive disorder, recurrent, moderate: Secondary | ICD-10-CM

## 2023-11-08 DIAGNOSIS — F439 Reaction to severe stress, unspecified: Secondary | ICD-10-CM

## 2023-11-08 DIAGNOSIS — F411 Generalized anxiety disorder: Secondary | ICD-10-CM
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
## 2023-11-09 ENCOUNTER — Ambulatory Visit (INDEPENDENT_AMBULATORY_CARE_PROVIDER_SITE_OTHER): Admitting: Psychiatry

## 2023-11-09 ENCOUNTER — Encounter: Payer: Self-pay | Admitting: Psychiatry

## 2023-11-09 VITALS — BP 128/80 | HR 80 | Temp 98.9°F | Ht 68.0 in | Wt 306.6 lb

## 2023-11-09 DIAGNOSIS — F909 Attention-deficit hyperactivity disorder, unspecified type: Secondary | ICD-10-CM

## 2023-11-09 DIAGNOSIS — F4310 Post-traumatic stress disorder, unspecified: Secondary | ICD-10-CM | POA: Diagnosis not present

## 2023-11-09 DIAGNOSIS — F410 Panic disorder [episodic paroxysmal anxiety] without agoraphobia: Secondary | ICD-10-CM

## 2023-11-09 DIAGNOSIS — F411 Generalized anxiety disorder: Secondary | ICD-10-CM | POA: Diagnosis not present

## 2023-11-09 DIAGNOSIS — F331 Major depressive disorder, recurrent, moderate: Secondary | ICD-10-CM

## 2023-11-09 MED ORDER — PRAZOSIN HCL 2 MG PO CAPS: 2.0000 mg | ORAL_CAPSULE | Freq: Every day | ORAL | 1 refills | Status: DC

## 2023-11-09 MED ORDER — PRAZOSIN HCL 1 MG PO CAPS: 1.0000 mg | ORAL_CAPSULE | Freq: Every day | ORAL | 0 refills | Status: DC

## 2023-11-09 MED ORDER — QUETIAPINE FUMARATE 50 MG PO TABS: 50.0000 mg | ORAL_TABLET | Freq: Every day | ORAL | 0 refills | Status: DC

## 2023-11-09 NOTE — Patient Instructions (Signed)
 Continue fluoxetine  80 mg daily  Continue quetiapine  50 mg at night  Continue buspirone  30 mg at night Start prazosin 1 mg at night for 3 days, then 2 mg at night  Continue Zaleplon  20 mg at night as needed for sleep  Continue Ritalin  10 mg daily  Next appointment:  7/22 at 4:30

## 2023-11-14 ENCOUNTER — Other Ambulatory Visit: Payer: Self-pay | Admitting: Psychiatry

## 2023-11-20 ENCOUNTER — Ambulatory Visit: Admitting: Professional Counselor

## 2023-11-29 ENCOUNTER — Other Ambulatory Visit: Payer: Self-pay | Admitting: Psychiatry

## 2023-11-29 ENCOUNTER — Telehealth: Payer: Self-pay

## 2023-11-29 MED ORDER — BUSPIRONE HCL 30 MG PO TABS: 30.0000 mg | ORAL_TABLET | Freq: Every day | ORAL | 0 refills | Status: AC

## 2023-11-29 NOTE — Telephone Encounter (Signed)
 Ordered

## 2023-11-29 NOTE — Telephone Encounter (Signed)
 pt was notified that rx was sent to the pharmacy. pt also stated that FLMA paperwork would be sent back out because she stayed out 4 day instead of 2 that was on the paperwork so they need that fix so she will not lose her job.  Pt was told that as of yet we have not received any paperwork for her and to please call them back and ask for them to fax it to our office.

## 2023-11-29 NOTE — Telephone Encounter (Signed)
 received fax requesting a refill on the buspirone  30mg  . pt was last seen on 6-12 and next appt 7-22

## 2023-12-05 ENCOUNTER — Telehealth: Payer: Self-pay | Admitting: Psychiatry

## 2023-12-05 NOTE — Telephone Encounter (Signed)
 I have completed the paperwork. Please send it after obtaining the signed ROI.

## 2023-12-11 ENCOUNTER — Telehealth: Payer: Self-pay | Admitting: Professional Counselor

## 2023-12-11 ENCOUNTER — Ambulatory Visit: Admitting: Professional Counselor

## 2023-12-11 NOTE — Telephone Encounter (Signed)
 Attempted to contact pt after second no-show to scheduled OPT appointment. Left HIPAA compliant message and sent letter

## 2023-12-15 ENCOUNTER — Telehealth: Payer: Self-pay | Admitting: Professional Counselor

## 2023-12-15 NOTE — Progress Notes (Signed)
 [facility] MD/PA/NP OP Progress Note  12/19/2023 5:20 PM Martha [name]  MRN:  [id number]  Chief Complaint:  Chief Complaint  Patient presents with   Follow-up   HPI:  This is a follow-up appointment for PTSD, depression and anxiety.  She states that she had to resubmit FMLA paperwork as she was written up.  She has not heard back from them since then.  She was out from work for vacation.  She stayed at home due to financial strain.  She reports good relationship with her boyfriend.  She has not met with him for the past few months due to her sickness, and mental health.  They communicate through the text, and  he has been supported while respecting the boundary.  Although she feels weird, she tries to get used to this.  She tends to stay in the room, not interacting with her father as much.  Her mother is recovering from this surgery for spinal cord stimulator.  She has been helping for household chores.  She had SI when she had issues at work.  However, she denies any plan or intent.  She thinks her dream is not as intense since taking prazosin .  She noticed that she was having some dizziness when she felt anxious.  She feels comfortable to stay on prazosin  at this time. The patient has mood symptoms as in PHQ-9/GAD-7.  She denies hallucinations.  She has fair concentration.  She agrees with the plans as outlined below.   Wt Readings from Last 3 Encounters:  12/19/23 (!) 302 lb (137 kg)  11/09/23 (!) 306 lb 9.6 oz (139.1 kg)  10/17/23 (!) 304 lb (137.9 kg)     Visit Diagnosis:    ICD-10-CM   1. PTSD (post-traumatic stress disorder)  F43.10     2. MDD (major depressive disorder), recurrent episode, moderate (HCC)  F33.1     3. GAD (generalized anxiety disorder)  F41.1     4. Panic disorder  F41.0     5. Attention deficit hyperactivity disorder (ADHD), unspecified ADHD type  F90.9     6. Insomnia, unspecified type  G47.00 zaleplon  (SONATA ) 10 MG capsule      Past Psychiatric History: Please  see initial evaluation for full details. I have reviewed the history. No updates at this time.     Past Medical History:  Past Medical History:  Diagnosis Date   ADHD (attention deficit hyperactivity disorder)    no meds taken   Allergy    Anxiety    Complication of anesthesia    woke up during colonoscopy and dental surgery.   Depression    Diabetes mellitus    pre-diabetic,told when younger   Family history of breast cancer    12/20 cancer genetic testing letter sent   Gastritis    GERD (gastroesophageal reflux disease)    Hashimoto's disease    Headache(784.0)    chronic migraines, decrease since BCP started   History of hiatal hernia    Hypertension    Idiopathic intracranial hypertension    Insomnia    Irregular menstrual cycle    pt states she started BCP,unsure of name to help regulate cycle   Obesity    Sleep apnea    Vision abnormalities    near sighted,wears glasses    Past Surgical History:  Procedure Laterality Date   CHOLECYSTECTOMY     COLONOSCOPY WITH PROPOFOL  N/A 12/03/2021   Procedure: COLONOSCOPY WITH PROPOFOL ;  Surgeon: Maryruth Ole DASEN, MD;  Location: [facility]  ENDOSCOPY;  Service: Endoscopy;  Laterality: N/A;   ENDOSCOPIC CONCHA BULLOSA RESECTION Right 10/12/2022   Procedure: ENDOSCOPIC CONCHA BULLOSA RESECTION;  Surgeon: Edda Mt, MD;  Location: [facility] ORS;  Service: ENT;  Laterality: Right;   ESOPHAGOGASTRODUODENOSCOPY (EGD) WITH PROPOFOL  N/A 12/03/2021   Procedure: ESOPHAGOGASTRODUODENOSCOPY (EGD) WITH PROPOFOL ;  Surgeon: Maryruth Ole DASEN, MD;  Location: [facility] ENDOSCOPY;  Service: Endoscopy;  Laterality: N/A;  DM   ETHMOIDECTOMY Bilateral 10/12/2022   Procedure: TOTAL ETHMOIDECTOMY;  Surgeon: Edda Mt, MD;  Location: [facility];  Service: ENT;  Laterality: Bilateral;   IMAGE GUIDED SINUS SURGERY Bilateral 10/12/2022   Procedure: IMAGE GUIDED SINUS SURGERY;  Surgeon: Edda Mt, MD;  Location: [facility];  Service: ENT;  Laterality: Bilateral;    SEPTOPLASTY N/A 10/12/2022   Procedure: SEPTOPLASTY;  Surgeon: Edda Mt, MD;  Location: [facility];  Service: ENT;  Laterality: N/A;   TONSILLECTOMY     at 28yo   UPPER GI ENDOSCOPY  01/2012    Family Psychiatric History: Please see initial evaluation for full details. I have reviewed the history. No updates at this time.     Family History:  Family History  Problem Relation Age of Onset   Thyroid  cancer Mother 65   Depression Mother    Hypertension Father    Anxiety disorder Father    Diabetes type II Father    Skin cancer Maternal Grandfather    Alzheimer's disease Maternal Grandfather    Alzheimer's disease Paternal Grandmother    Leukemia Paternal Aunt    Breast cancer Paternal Aunt 40   Hypertension Maternal Grandmother    Diabetes type II Maternal Grandmother     Social History:  Social History   Socioeconomic History   Marital status: Single    Spouse name: Not on file   Number of children: Not on file   Years of education: Not on file   Highest education level: Not on file  Occupational History   Occupation: Consulting civil engineer    Comment: [employer] 12th grade  Tobacco Use   Smoking status: Never   Smokeless tobacco: Never  Vaping Use   Vaping status: Former   Quit date: 10/30/2018  Substance and Sexual Activity   Alcohol use: Yes    Comment: occ   Drug use: No   Sexual activity: Not Currently    Partners: Male    Birth control/protection: Pill  Other Topics Concern   Not on file  Social History Narrative   Not on file   Social Drivers of Health   Financial Resource Strain: Low Risk  (07/12/2023)   Overall Financial Resource Strain (CARDIA)    Difficulty of Paying Living Expenses: Not hard at all  Food Insecurity: No Food Insecurity (07/12/2023)   Hunger Vital Sign    Worried About Running Out of Food in the Last Year: Never true    Ran Out of Food in the Last Year: Never true  Transportation Needs: No Transportation Needs (07/12/2023)   PRAPARE -  Administrator, Civil Service (Medical): No    Lack of Transportation (Non-Medical): No  Physical Activity: Insufficiently Active (07/12/2023)   Exercise Vital Sign    Days of Exercise per Week: 5 days    Minutes of Exercise per Session: 10 min  Stress: Stress Concern Present (07/12/2023)   [employer] of Occupational Health - Occupational Stress Questionnaire    Feeling of Stress : To some extent  Social Connections: Moderately Isolated (07/12/2023)   Social Connection and Isolation Panel  Frequency of Communication with Friends and Family: More than three times a week    Frequency of Social Gatherings with Friends and Family: Once a week    Attends Religious Services: More than 4 times per year    Active Member of [employer] or Organizations: No    Attends Banker Meetings: Never    Marital Status: Never married    Allergies:  Allergies  Allergen Reactions   Doxycycline Other (See Comments)   Lamisil [Terbinafine]    Wellbutrin  [Bupropion ] Other (See Comments)    Suicidal Ideation    Wound Dressing Adhesive Hives    I welp up    Metabolic Disorder Labs: Lab Results  Component Value Date   HGBA1C 6.5 (H) 04/19/2021   No results found for: PROLACTIN Lab Results  Component Value Date   CHOL 151 04/20/2020   TRIG 136 04/20/2020   HDL 40 04/20/2020   CHOLHDL 3.8 04/20/2020   LDLCALC 87 04/20/2020   Lab Results  Component Value Date   TSH 3.216 03/01/2023   TSH 6.670 (H) 04/20/2020    Therapeutic Level Labs: No results found for: LITHIUM No results found for: VALPROATE No results found for: CBMZ  Current Medications: Current Outpatient Medications  Medication Sig Dispense Refill   FLUoxetine  (PROZAC ) 20 MG capsule Take 1 capsule (20 mg total) by mouth daily. Total of 60 mg daily. Take along with 40 mg cap 90 capsule 0   FLUoxetine  (PROZAC ) 40 MG capsule Take 2 capsules (80 mg total) by mouth daily. (Patient taking differently:  Take 40 mg by mouth daily. Total of 60 mg daily) 180 capsule 0   ascorbic acid (VITAMIN C) 1000 MG tablet Take 1,000 mg by mouth daily.     busPIRone  (BUSPAR ) 30 MG tablet Take 1 tablet (30 mg total) by mouth at bedtime. 90 tablet 0   calcium carbonate (OSCAL) 1500 (600 Ca) MG TABS tablet Take 600 mg of elemental calcium by mouth daily with breakfast.     Cholecalciferol (VITAMIN D -1000 MAX ST) 25 MCG (1000 UT) tablet Take 1,000 Units by mouth daily.     hydrochlorothiazide (MICROZIDE) 12.5 MG capsule Take 12.5 mg by mouth daily.     levothyroxine (SYNTHROID) 75 MCG tablet Take 75 mcg by mouth daily before breakfast.     magnesium oxide (MAG-OX) 400 MG tablet Take 400 mg by mouth daily.     metFORMIN (GLUCOPHAGE) 500 MG tablet Take 250 mg by mouth at bedtime as needed.     [START ON 12/21/2023] methylphenidate  (RITALIN ) 10 MG tablet Take 1 tablet (10 mg total) by mouth daily. 30 tablet 0   [START ON 01/20/2024] methylphenidate  (RITALIN ) 10 MG tablet Take 1 tablet (10 mg total) by mouth daily before breakfast. 30 tablet 0   metoprolol tartrate (LOPRESSOR) 50 MG tablet Take 50 mg by mouth 2 (two) times daily.     Multiple Vitamin (MULTI-VITAMIN) tablet Take by mouth.     Multiple Vitamins-Minerals (ZINC  PO) Take 1 tablet by mouth daily.     norethindrone  (MICRONOR ) 0.35 MG tablet Take 1 tablet by mouth daily.     pantoprazole (PROTONIX) 40 MG tablet Take 40 mg by mouth at bedtime.     [START ON 01/11/2024] prazosin  (MINIPRESS ) 2 MG capsule Take 1 capsule (2 mg total) by mouth at bedtime. 90 capsule 0   promethazine (PHENERGAN) 12.5 MG tablet Take 12.5 mg by mouth every 6 (six) hours as needed for nausea or vomiting.     QUEtiapine  (SEROQUEL )  50 MG tablet Take 1 tablet (50 mg total) by mouth at bedtime. 90 tablet 0   spironolactone (ALDACTONE) 100 MG tablet Take 200 mg by mouth daily.  9   tizanidine (ZANAFLEX) 2 MG capsule Take 2 mg by mouth at bedtime as needed for muscle spasms.     Ubrogepant  (UBRELVY PO) Take 1 tablet by mouth as needed.     vitamin B-12 (CYANOCOBALAMIN) 1000 MCG tablet Take 1,000 mcg by mouth daily.     [START ON 12/24/2023] zaleplon  (SONATA ) 10 MG capsule Take 2 capsules (20 mg total) by mouth at bedtime. 60 capsule 1   No current facility-administered medications for this visit.     Musculoskeletal: Strength & Muscle Tone: within normal limits Gait & Station: normal Patient leans: N/A  Psychiatric Specialty Exam: Review of Systems  Psychiatric/Behavioral:  Positive for dysphoric mood and sleep disturbance. Negative for agitation, behavioral problems, confusion, decreased concentration, hallucinations, self-injury and suicidal ideas. The patient is nervous/anxious. The patient is not hyperactive.   All other systems reviewed and are negative.   Blood pressure 127/74, pulse 81, temperature 97.7 F (36.5 C), temperature source Temporal, height 5' 8 (1.727 m), weight (!) 302 lb (137 kg).Body mass index is 45.92 kg/m.  General Appearance: Well Groomed  Eye Contact:  Good  Speech:  Clear and Coherent  Volume:  Normal  Mood:  Anxious and Depressed  Affect:  Appropriate, Congruent, and calm  Thought Process:  Coherent  Orientation:  Full (Time, Place, and Person)  Thought Content: Logical   Suicidal Thoughts:  No  Homicidal Thoughts:  No  Memory:  Immediate;   Good  Judgement:  Good  Insight:  Good  Psychomotor Activity:  Normal  Concentration:  Concentration: Good and Attention Span: Good  Recall:  Good  Fund of Knowledge: Good  Language: Good  Akathisia:  No  Handed:  Right  AIMS (if indicated): not done  Assets:  Communication Skills Desire for Improvement  ADL's:  Intact  Cognition: WNL  Sleep:  Fair   Screenings: GAD-7    Flowsheet Row Office Visit from 12/19/2023 in [facility] Counselor from 07/12/2023 in [facility] Office Visit from 06/08/2023 in [facility] Office Visit from 02/27/2023 in [facility] Office Visit from 12/27/2022 in [facility]  Total GAD-7 Score 7 6 7 14 14    PHQ2-9    Flowsheet Row Office Visit from 12/19/2023 in [facility] Office Visit from 09/25/2023 in [facility] Counselor from 07/12/2023 in [facility] Office Visit from 06/08/2023 in [facility] Office Visit from 02/27/2023 in [facility]  PHQ-2 Total Score 3 2 2 2 4   PHQ-9 Total Score 10 9 9 9 17    Flowsheet Row Office Visit from 12/19/2023 in [facility] ED from 10/17/2023 in [facility] Emergency Department at [facility] Visit from 09/25/2023 in [facility]  C-SSRS RISK CATEGORY Error: Q3, 4, or 5 should not be populated when Q2 is No No Risk Error: Q3, 4, or 5 should not be populated when Q2 is No     Assessment and Plan:  Martha [name] is a 28 y.o. year old female with a history of depression, anxiety, ADHD, migraine, GERD, who presents for  follow up appointment for below.    1. PTSD (post-traumatic stress disorder) 2. MDD (major depressive disorder), recurrent episode, moderate (HCC) 3. GAD (generalized anxiety disorder) 4. Panic disorder She describes a childhood marked by emotional neglect--receiving material items such as toys but lacking emotional nurturing. Her mother frequently told her to lose weight as a child and she has a fear of abandonment. She has also experienced significant losses, including the death of maternal grandmother due to COVID-19 and the decline of paternal grandmother who is currently in a nursing facility with Alzheimer's  disease.   History: Tx from [facility]. Depression, anxiety, insomnia for many years. admitted once in 2013 for depression. Originally on Pristiq  100 mg daily, buspirone  30 mg qhs, zaleplon  20 mg qhs, Ritalin  10 mg daily   She continues to experience depressive symptoms and anxiety, although this appears to be slightly improving since the previous visit.  We will lower the dose of fluoxetine  given she reports limited benefit from this higher dose/to mitigate risk of polypharmacy.  Will continue current dose of prazosin  for disturbed sleep, given she reports good benefit from this.  Noted that she reports drowsiness related to anxiety; will continue to monitor this.  Discussed potential risk of orthostatic hypotension, dizziness.  Will continue BuSpar  for anxiety.  Will continue quetiapine  adjunctive treatment for depression.  Will consider uptitration of this medication if any worsening in her mood symptoms, while monitoring any dizziness.  Noted that although she would be a good candidate for TMS, she has Medicaid, which does not cover this treatment.  She understood and plans to discuss this again once she obtains other insurance.  She will continue to see Ms. Veva for therapy.   5. Attention deficit hyperactivity disorder (ADHD), unspecified ADHD type - neuropsych testing was consistent with ADHD. UDS negative 12/2022      Overall stable.  Will continue current dose of Ritalin  to target ADHD.    5. Insomnia, unspecified type - reportedly struggles with insomnia since age 1. She had CPAP study titration 07/2022.s/p surgery for deviated septum Improvement in vivid dreams since starting prazosin .  Will continue current dose.  Will continue zaleplon  to target insomnia, with the hope to taper it off in the future to avoid the risk of dependence.    Plan Decrease fluoxetine  60 mg daily  Continue quetiapine  50 mg at night (on metformin, mounjaro) Continue buspirone  30 mg at night Continue prazosin  2 mg at  night - monitor dizziness Continue Zaleplon  20 mg at night as needed for sleep - refill left Continue Ritalin  10 mg daily Next appointment: 9/16 at 4:30, IP - on metoprolol  - on mounjaro, 8 months   Past trials of medication: sertraline, Pristiq , bupropion  (some adverse reaction in the past), Abilify (visual change)     The patient demonstrates the following risk factors for suicide: Chronic risk factors for suicide include: psychiatric disorder of depression, anxiety. Acute risk factors for suicide include: family or marital conflict, work related stress. Protective factors for this patient include: positive social support, coping skills and hope for the future. Considering these factors, the overall suicide risk at this point appears to be moderate, but not at imminent risk. Patient is appropriate for outpatient follow up.  Collaboration of Care: Collaboration of Care: Other reviewed notes in Epic  Patient/Guardian was advised Release of Information must be obtained prior to any record release in order to collaborate their care with an outside provider. Patient/Guardian was advised if they have not already done  so to contact the registration department to sign all necessary forms in order for us  to release information regarding their care.   Consent: Patient/Guardian gives verbal consent for treatment and assignment of benefits for services provided during this visit. Patient/Guardian expressed understanding and agreed to proceed.    Katheren Sleet, MD 12/19/2023, 5:20 PM

## 2023-12-19 ENCOUNTER — Other Ambulatory Visit: Payer: Self-pay

## 2023-12-19 ENCOUNTER — Encounter: Payer: Self-pay | Admitting: Psychiatry

## 2023-12-19 ENCOUNTER — Ambulatory Visit (INDEPENDENT_AMBULATORY_CARE_PROVIDER_SITE_OTHER): Admitting: Psychiatry

## 2023-12-19 VITALS — BP 127/74 | HR 81 | Temp 97.7°F | Ht 68.0 in | Wt 302.0 lb

## 2023-12-19 DIAGNOSIS — F410 Panic disorder [episodic paroxysmal anxiety] without agoraphobia: Secondary | ICD-10-CM

## 2023-12-19 DIAGNOSIS — F4310 Post-traumatic stress disorder, unspecified: Secondary | ICD-10-CM | POA: Diagnosis not present

## 2023-12-19 DIAGNOSIS — F411 Generalized anxiety disorder: Secondary | ICD-10-CM

## 2023-12-19 DIAGNOSIS — F909 Attention-deficit hyperactivity disorder, unspecified type: Secondary | ICD-10-CM

## 2023-12-19 DIAGNOSIS — G4700 Insomnia, unspecified: Secondary | ICD-10-CM

## 2023-12-19 DIAGNOSIS — F331 Major depressive disorder, recurrent, moderate: Secondary | ICD-10-CM | POA: Diagnosis not present

## 2023-12-19 MED ORDER — METHYLPHENIDATE HCL 10 MG PO TABS: 10.0000 mg | ORAL_TABLET | Freq: Every day | ORAL | 0 refills | Status: DC

## 2023-12-19 MED ORDER — PRAZOSIN HCL 2 MG PO CAPS
2.0000 mg | ORAL_CAPSULE | Freq: Every day | ORAL | 0 refills | Status: DC
Start: 2024-01-11 — End: 2024-04-11

## 2023-12-19 MED ORDER — FLUOXETINE HCL 20 MG PO CAPS: 20.0000 mg | ORAL_CAPSULE | Freq: Every day | ORAL | 0 refills | Status: DC

## 2023-12-19 MED ORDER — ZALEPLON 10 MG PO CAPS: 20.0000 mg | ORAL_CAPSULE | Freq: Every day | ORAL | 1 refills | Status: DC

## 2023-12-20 ENCOUNTER — Other Ambulatory Visit: Payer: Self-pay | Admitting: Psychiatry

## 2023-12-20 NOTE — Telephone Encounter (Signed)
 Upon closer review of the database, she is correct. For some reason, she did not receive the last 30-day dose. Could you please contact the pharmacy to fill Zaleplon  on 7/24, and update the patient? Thanks.

## 2023-12-25 ENCOUNTER — Ambulatory Visit: Admitting: Professional Counselor

## 2023-12-26 ENCOUNTER — Ambulatory Visit (INDEPENDENT_AMBULATORY_CARE_PROVIDER_SITE_OTHER): Admitting: Professional Counselor

## 2023-12-26 DIAGNOSIS — F909 Attention-deficit hyperactivity disorder, unspecified type: Secondary | ICD-10-CM | POA: Diagnosis not present

## 2023-12-26 DIAGNOSIS — F331 Major depressive disorder, recurrent, moderate: Secondary | ICD-10-CM | POA: Diagnosis not present

## 2023-12-26 DIAGNOSIS — F4310 Post-traumatic stress disorder, unspecified: Secondary | ICD-10-CM

## 2023-12-26 DIAGNOSIS — F411 Generalized anxiety disorder: Secondary | ICD-10-CM | POA: Diagnosis not present

## 2023-12-26 NOTE — Progress Notes (Signed)
  THERAPIST PROGRESS NOTE  Session Time: 3:50 PM - 4:31 PM   Participation Level: Active  Behavioral Response: Well Groomed, Alert, Anxious  Type of Therapy: Individual Therapy  Treatment Goals addressed:  Active OP Depression  LTG: I'd just be able to cope with the stress because I can't. (Not Progressing)    Start:  08/16/23    Expected End:  08/14/24    Goal Note Reviewed 12/25/2023 - Yeah, that's still a thing. A tiny bit, but I don't know if it's affecting my health.   STG: Coping. To improve ability to cope AEB reduction in breakdowns/crying spells/etc. by using coping skills over the next 12 weeks (Progressing)  Goal Note Reviewed 12/26/23 - I'd say a little bit better but I wouldn't say by a whole lot.   ProgressTowards Goals: Not Progressing  Interventions: CBT, Motivational Interviewing, and Supportive  Summary: Martha [name] is a 28 y.o. female who presents with a history of anxiety, depression, trauma, and ADHD. She appeared anxious but oriented x5. She provided reasons for missing recent appointments. 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 She reported things at home have smoothed over because they all act like nothing has happened. However, she has been more guarded with her father. Martha [name] noted minimal progress on goals and stated her own adherence has been the problem. She identified an affirmation to help build her belief/ability to change. She also noted a hobby she would like to engage in again that would get her out of the house more.   Therapist Response: Conducted session with [facility]. Began session with check-in/update since previous session. Utilized empathetic and reflective listening. Reviewed treatment plan with input from [facility] on current strengths, needs, and progress towards  goals. Used open-ended questions to facilitate discussion and summarized thoughts/feelings. Assisted with identifying an affirmation to start practicing to change belief in ability to change/create new behaviors. Scheduled additional appointment and concluded session.   Suicidal/Homicidal: No  Plan: Return again in 5 weeks.  Diagnosis: MDD (major depressive disorder), recurrent episode, moderate (HCC)  GAD (generalized anxiety disorder)  PTSD (post-traumatic stress disorder)  Attention deficit hyperactivity disorder (ADHD), unspecified ADHD type  Collaboration of Care: Medication Management AEB chart review  Patient/Guardian was advised Release of Information must be obtained prior to any record release in order to collaborate their care with an outside provider. Patient/Guardian was advised if they have not already done so to contact the registration department to sign all necessary forms in order for us  to release information regarding their care.   Consent: Patient/Guardian gives verbal consent for treatment and assignment of benefits for services provided during this visit. Patient/Guardian expressed understanding and agreed to proceed.   Martha [name], [facility] 12/26/2023

## 2024-01-08 ENCOUNTER — Ambulatory Visit: Admitting: Professional Counselor

## 2024-01-09 ENCOUNTER — Ambulatory Visit: Admitting: Professional Counselor

## 2024-01-31 ENCOUNTER — Ambulatory Visit (INDEPENDENT_AMBULATORY_CARE_PROVIDER_SITE_OTHER): Admitting: Professional Counselor

## 2024-01-31 DIAGNOSIS — F331 Major depressive disorder, recurrent, moderate: Secondary | ICD-10-CM

## 2024-01-31 DIAGNOSIS — F411 Generalized anxiety disorder: Secondary | ICD-10-CM | POA: Diagnosis not present

## 2024-01-31 DIAGNOSIS — F4310 Post-traumatic stress disorder, unspecified: Secondary | ICD-10-CM | POA: Diagnosis not present

## 2024-01-31 DIAGNOSIS — F909 Attention-deficit hyperactivity disorder, unspecified type: Secondary | ICD-10-CM | POA: Diagnosis not present

## 2024-01-31 NOTE — Progress Notes (Signed)
  THERAPIST PROGRESS NOTE  Session Time: 1:00 PM - 1:40 PM   Participation Level: Active  Behavioral Response: Well Groomed, Alert, Dysphoric  Type of Therapy: Individual Therapy  Treatment Goals addressed:  Active OP Depression  LTG: I'd just be able to cope with the stress because I can't. (Not Progressing)                Start:  08/16/23    Expected End:  08/14/24    Goal Note Reviewed 12/25/2023 - Yeah, that's still a thing. A tiny bit, but I don't know if it's affecting my health.    STG: Coping. To improve ability to cope AEB reduction in breakdowns/crying spells/etc. by using coping skills over the next 12 weeks (Progressing)  Goal Note Reviewed 12/26/23 - I'd say a little bit better but I wouldn't say by a whole lot.  ProgressTowards Goals: Progressing  Interventions: Motivational Interviewing and Supportive  Summary: MADISON DIRENZO is a 28 y.o. female who presents with a history of anxiety, depression, trauma, and ADHD. She appeared somber but oriented x5. She reported her grandmother was released on hospice and they are preparing for her to pass. Noami expressed concerns about her father's reaction to this and noted she is unsure how she will respond herself. She reported things have been okay with work. 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 Madelin has been able to work on getting out of the house more and has spent more time with her boyfriend. She identified another way to work on this with doing extra tasks on week day evenings. Aranza plans to continue working on these skills to build comfort and routine.   Therapist Response: Conducted session with [facility]. Began session with check-in/update since previous session. Utilized empathetic and reflective listening. Used open-ended questions to facilitate discussion and summarized Porshe's thoughts/feelings. Praised Government social research officer for engaging in  socialization/exposure activities and explored ways to continue building on this. Scheduled additional appointment and concluded session.   Suicidal/Homicidal: No  Plan: Return again in 3 weeks.  Diagnosis: MDD (major depressive disorder), recurrent episode, moderate (HCC)  PTSD (post-traumatic stress disorder)  GAD (generalized anxiety disorder)  Attention deficit hyperactivity disorder (ADHD), unspecified ADHD type  Collaboration of Care: Medication Management AEB chart review  Patient/Guardian was advised Release of Information must be obtained prior to any record release in order to collaborate their care with an outside provider. Patient/Guardian was advised if they have not already done so to contact the registration department to sign all necessary forms in order for us  to release information regarding their care.   Consent: Patient/Guardian gives verbal consent for treatment and assignment of benefits for services provided during this visit. Patient/Guardian expressed understanding and agreed to proceed.   [name], [facility] 01/31/2024

## 2024-02-05 ENCOUNTER — Other Ambulatory Visit: Payer: Self-pay | Admitting: Psychiatry

## 2024-02-09 NOTE — Progress Notes (Signed)
 [facility] MD/PA/NP OP Progress Note  02/13/2024 5:16 PM Martha [name]  MRN:  [id number]  Chief Complaint:  Chief Complaint  Patient presents with   Follow-up   HPI:  This is a follow-up appointment for depression, PTSD, anxiety, ADHD.  She states that her grandmother had a UTI.  She had an episode of falling since being prescribed some medication for treatment.  She is back to the facility, and is getting palliative care.  She states that she does not visit her often as it reminds her of certain memories.  She had episodes of crying after visiting her for a few minutes.  She feels that her body remembers, while her mind does not.  Her grandmother was living next to her, and she and her siblings always went there when her parents were not available.  They commented about the weight and others.  She attributes to having anxiety due to the way she was raised.  At the same time, she also remembers the good thing with her.  She states that it is waiting game.  She is also concerned about her father, who might relapse in alcohol if something were to happen to her grandmother.  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  Although she loves this, this is only getting her FMLA.  She has occasional insomnia and menstrual cycle.  Although she was having intense anxiety due to the condition around her grandmother, it has been more stable lately.  Although she continues to get distracted, she denies much concern at work.  She denies SI, HI, hallucinations.  She has not noticed any difference since lowering the dose of fluoxetine .  She feels comfortable to stay on the current dose.   Wt Readings from Last 3 Encounters:  02/13/24 297 lb (134.7 kg)  12/19/23 (!) 302 lb (137 kg)  11/09/23 (!) 306 lb 9.6 oz (139.1 kg)     Visit Diagnosis:    ICD-10-CM   1. PTSD (post-traumatic stress disorder)  F43.10     2. MDD (major depressive disorder), recurrent episode, mild (HCC)  F33.0     3. GAD  (generalized anxiety disorder)  F41.1     4. Panic disorder  F41.0     5. Attention deficit hyperactivity disorder (ADHD), unspecified ADHD type  F90.9     6. Insomnia, unspecified type  G47.00 zaleplon  (SONATA ) 10 MG capsule      Past Psychiatric History: Please see initial evaluation for full details. I have reviewed the history. No updates at this time.     Past Medical History:  Past Medical History:  Diagnosis Date   ADHD (attention deficit hyperactivity disorder)    no meds taken   Allergy    Anxiety    Complication of anesthesia    woke up during colonoscopy and dental surgery.   Depression    Diabetes mellitus    pre-diabetic,told when younger   Family history of breast cancer    12/20 cancer genetic testing letter sent   Gastritis    GERD (gastroesophageal reflux disease)    Hashimoto's disease    Headache(784.0)    chronic migraines, decrease since BCP started   History of hiatal hernia    Hypertension    Idiopathic intracranial hypertension    Insomnia    Irregular menstrual cycle    pt states she started BCP,unsure of name to help regulate cycle   Obesity    Sleep apnea    Vision abnormalities  near sighted,wears glasses    Past Surgical History:  Procedure Laterality Date   CHOLECYSTECTOMY     COLONOSCOPY WITH PROPOFOL  N/A 12/03/2021   Procedure: COLONOSCOPY WITH PROPOFOL ;  Surgeon: Maryruth Ole DASEN, MD;  Location: [facility] ENDOSCOPY;  Service: Endoscopy;  Laterality: N/A;   ENDOSCOPIC CONCHA BULLOSA RESECTION Right 10/12/2022   Procedure: ENDOSCOPIC CONCHA BULLOSA RESECTION;  Surgeon: Edda Mt, MD;  Location: [facility] ORS;  Service: ENT;  Laterality: Right;   ESOPHAGOGASTRODUODENOSCOPY (EGD) WITH PROPOFOL  N/A 12/03/2021   Procedure: ESOPHAGOGASTRODUODENOSCOPY (EGD) WITH PROPOFOL ;  Surgeon: Maryruth Ole DASEN, MD;  Location: [facility] ENDOSCOPY;  Service: Endoscopy;  Laterality: N/A;  DM   ETHMOIDECTOMY Bilateral 10/12/2022   Procedure: TOTAL ETHMOIDECTOMY;   Surgeon: Edda Mt, MD;  Location: [facility] ORS;  Service: ENT;  Laterality: Bilateral;   IMAGE GUIDED SINUS SURGERY Bilateral 10/12/2022   Procedure: IMAGE GUIDED SINUS SURGERY;  Surgeon: Edda Mt, MD;  Location: [facility] ORS;  Service: ENT;  Laterality: Bilateral;   SEPTOPLASTY N/A 10/12/2022   Procedure: SEPTOPLASTY;  Surgeon: Edda Mt, MD;  Location: [facility] ORS;  Service: ENT;  Laterality: N/A;   TONSILLECTOMY     at 28yo   UPPER GI ENDOSCOPY  01/2012    Family Psychiatric History: Please see initial evaluation for full details. I have reviewed the history. No updates at this time.     Family History:  Family History  Problem Relation Age of Onset   Thyroid  cancer Mother 53   Depression Mother    Hypertension Father    Anxiety disorder Father    Diabetes type II Father    Skin cancer Maternal Grandfather    Alzheimer's disease Maternal Grandfather    Alzheimer's disease Paternal Grandmother    Leukemia Paternal Aunt    Breast cancer Paternal Aunt 40   Hypertension Maternal Grandmother    Diabetes type II Maternal Grandmother     Social History:  Social History   Socioeconomic History   Marital status: Single    Spouse name: Not on file   Number of children: Not on file   Years of education: Not on file   Highest education level: Not on file  Occupational History   Occupation: Consulting civil engineer    Comment: [employer] 12th grade  Tobacco Use   Smoking status: Never   Smokeless tobacco: Never  Vaping Use   Vaping status: Former   Quit date: 10/30/2018  Substance and Sexual Activity   Alcohol use: Yes    Comment: occ   Drug use: No   Sexual activity: Not Currently    Partners: Male    Birth control/protection: Pill  Other Topics Concern   Not on file  Social History Narrative   Not on file   Social Drivers of Health   Financial Resource Strain: Low Risk  (07/12/2023)   Overall Financial Resource Strain (CARDIA)    Difficulty of Paying Living Expenses: Not  hard at all  Food Insecurity: No Food Insecurity (07/12/2023)   Hunger Vital Sign    Worried About Running Out of Food in the Last Year: Never true    Ran Out of Food in the Last Year: Never true  Transportation Needs: No Transportation Needs (07/12/2023)   PRAPARE - Administrator, Civil Service (Medical): No    Lack of Transportation (Non-Medical): No  Physical Activity: Insufficiently Active (07/12/2023)   Exercise Vital Sign    Days of Exercise per Week: 5 days    Minutes of Exercise per Session: 10 min  Stress: Stress Concern Present (07/12/2023)   [employer] of Occupational Health - Occupational Stress Questionnaire    Feeling of Stress : To some extent  Social Connections: Moderately Isolated (07/12/2023)   Social Connection and Isolation Panel    Frequency of Communication with Friends and Family: More than three times a week    Frequency of Social Gatherings with Friends and Family: Once a week    Attends Religious Services: More than 4 times per year    Active Member of [employer] or Organizations: No    Attends Banker Meetings: Never    Marital Status: Never married    Allergies:  Allergies  Allergen Reactions   Doxycycline Other (See Comments)   Lamisil [Terbinafine]    Wellbutrin  [Bupropion ] Other (See Comments)    Suicidal Ideation    Wound Dressing Adhesive Hives    I welp up    Metabolic Disorder Labs: Lab Results  Component Value Date   HGBA1C 6.5 (H) 04/19/2021   No results found for: PROLACTIN Lab Results  Component Value Date   CHOL 151 04/20/2020   TRIG 136 04/20/2020   HDL 40 04/20/2020   CHOLHDL 3.8 04/20/2020   LDLCALC 87 04/20/2020   Lab Results  Component Value Date   TSH 3.216 03/01/2023   TSH 6.670 (H) 04/20/2020    Therapeutic Level Labs: No results found for: LITHIUM No results found for: VALPROATE No results found for: CBMZ  Current Medications: Current Outpatient Medications  Medication Sig  Dispense Refill   ascorbic acid (VITAMIN C) 1000 MG tablet Take 1,000 mg by mouth daily.     busPIRone  (BUSPAR ) 30 MG tablet Take 1 tablet (30 mg total) by mouth at bedtime. 90 tablet 0   Cholecalciferol (VITAMIN D -1000 MAX ST) 25 MCG (1000 UT) tablet Take 1,000 Units by mouth daily.     FLUoxetine  (PROZAC ) 40 MG capsule Take 1 capsule (40 mg total) by mouth daily. Total of 60 mg daily, take along with 20 mg cap 90 capsule 1   furosemide (LASIX) 20 MG tablet Take 20 mg by mouth daily.     levothyroxine (SYNTHROID) 75 MCG tablet Take 75 mcg by mouth daily before breakfast.     magnesium oxide (MAG-OX) 400 MG tablet Take 400 mg by mouth daily.     metFORMIN (GLUCOPHAGE) 500 MG tablet Take 250 mg by mouth at bedtime as needed.     methylphenidate  (RITALIN ) 10 MG tablet Take 1 tablet (10 mg total) by mouth daily. 30 tablet 0   methylphenidate  (RITALIN ) 10 MG tablet Take 1 tablet (10 mg total) by mouth daily before breakfast. 30 tablet 0   metoprolol tartrate (LOPRESSOR) 50 MG tablet Take 50 mg by mouth 2 (two) times daily.     MOUNJARO 10 MG/0.5ML Pen Inject 10 mg into the skin once a week.     Multiple Vitamin (MULTI-VITAMIN) tablet Take by mouth.     Multiple Vitamins-Minerals (ZINC  PO) Take 1 tablet by mouth daily.     norethindrone  (MICRONOR ) 0.35 MG tablet Take 1 tablet by mouth daily.     pantoprazole (PROTONIX) 40 MG tablet Take 40 mg by mouth at bedtime.     prazosin  (MINIPRESS ) 2 MG capsule Take 1 capsule (2 mg total) by mouth at bedtime. 90 capsule 0   promethazine (PHENERGAN) 12.5 MG tablet Take 12.5 mg by mouth every 6 (six) hours as needed for nausea or vomiting.     QUEtiapine  (SEROQUEL ) 50 MG tablet Take 1 tablet (50  mg total) by mouth at bedtime. 90 tablet 0   spironolactone (ALDACTONE) 100 MG tablet Take 200 mg by mouth daily.  9   tizanidine (ZANAFLEX) 2 MG capsule Take 2 mg by mouth at bedtime as needed for muscle spasms.     Ubrogepant (UBRELVY PO) Take 1 tablet by mouth as  needed.     vitamin B-12 (CYANOCOBALAMIN) 1000 MCG tablet Take 1,000 mcg by mouth daily.     calcium carbonate (OSCAL) 1500 (600 Ca) MG TABS tablet Take 600 mg of elemental calcium by mouth daily with breakfast.     [START ON 03/18/2024] FLUoxetine  (PROZAC ) 20 MG capsule Take 1 capsule (20 mg total) by mouth daily. Total of 60 mg daily. Take along with 40 mg cap 90 capsule 1   hydrochlorothiazide (MICROZIDE) 12.5 MG capsule Take 12.5 mg by mouth daily. (Patient not taking: Reported on 02/13/2024)     [START ON 02/22/2024] zaleplon  (SONATA ) 10 MG capsule Take 2 capsules (20 mg total) by mouth at bedtime. 60 capsule 1   No current facility-administered medications for this visit.     Musculoskeletal: Strength & Muscle Tone: within normal limits Gait & Station: normal Patient leans: N/A  Psychiatric Specialty Exam: Review of Systems  Psychiatric/Behavioral:  Positive for decreased concentration, dysphoric mood and sleep disturbance. Negative for agitation, behavioral problems, confusion, hallucinations, self-injury and suicidal ideas. The patient is nervous/anxious. The patient is not hyperactive.   All other systems reviewed and are negative.   Blood pressure 118/72, pulse 63, temperature 97.6 F (36.4 C), temperature source Temporal, height 5' 8.5 (1.74 m), weight 297 lb (134.7 kg), SpO2 100%.Body mass index is 44.5 kg/m.  General Appearance: Well Groomed  Eye Contact:  Good  Speech:  Clear and Coherent  Volume:  Normal  Mood:  fine  Affect:  Appropriate, Congruent, and calm  Thought Process:  Coherent  Orientation:  Full (Time, Place, and Person)  Thought Content: Logical   Suicidal Thoughts:  No  Homicidal Thoughts:  No  Memory:  Immediate;   Good  Judgement:  Good  Insight:  Good  Psychomotor Activity:  Normal  Concentration:  Concentration: Good and Attention Span: Good  Recall:  Good  Fund of Knowledge: Good  Language: Good  Akathisia:  No  Handed:  Right  AIMS (if  indicated): not done  Assets:  Communication Skills Desire for Improvement  ADL's:  Intact  Cognition: WNL  Sleep:  Fair   Screenings: GAD-7    Garment/textile technologist Visit from 02/13/2024 in [facility] Office Visit from 12/19/2023 in [facility] Counselor from 07/12/2023 in [facility] Office Visit from 06/08/2023 in [facility] Office Visit from 02/27/2023 in [facility]  Total GAD-7 Score 7 7 6 7 14    PHQ2-9    Flowsheet Row Office Visit from 02/13/2024 in [facility] Office Visit from 12/19/2023 in [facility] Office Visit from 09/25/2023 in [facility] Counselor from 07/12/2023 in [facility] Office Visit from 06/08/2023 in [facility]  PHQ-2 Total Score 2 3 2 2 2   PHQ-9 Total Score 9 10 9 9 9    Flowsheet Row Office Visit from 02/13/2024 in [facility] Office Visit from 12/19/2023 in [facility] ED from 10/17/2023  in [facility] Emergency Department at [facility]  C-SSRS RISK CATEGORY No Risk Error: Q3, 4, or 5 should not be populated when Q2 is No No Risk     Assessment and Plan:  CAYENNE BREAULT is a 28 y.o. year old female with a history of depression, anxiety, ADHD, migraine, GERD, who presents for follow up appointment for below.    1. PTSD (post-traumatic stress disorder) 2. MDD (major depressive disorder), recurrent episode, mild (HCC) 3. GAD (generalized anxiety disorder) 4. Panic disorder She describes a childhood marked by emotional neglect --receiving material items such as toys but lacking  emotional nurturing. Her mother frequently told her to lose weight as a child and she has a fear of abandonment. She has also experienced significant losses, including the death of maternal grandmother due to COVID-19 and the decline of paternal grandmother who is currently in a nursing facility with Alzheimer's disease.   History: Tx from [facility]. Depression, anxiety, insomnia for many years. admitted once in 2013 for depression. Originally on Pristiq  100 mg daily, buspirone  30 mg qhs, zaleplon  20 mg qhs, Ritalin  10 mg daily.    TMS was discussed- if she obtains other insurance (currently on Medicaid) Although she continues to experience PTSD, depressive symptoms and anxiety related to her grandmother's condition, things has been relatively manageable. She finds the current workplace to be helpful, and has started school for coding as well. She has not noticed any difference since lowering the dose of fluoxetine ; will continue current dose to target PTSD, depression and anxiety.  Will continue quetiapine  as adjunctive treatment for depression.  Will continue BuSpar  for anxiety, and prazosin  for disturbed sleep.  She will continue to see Ms. Veva for therapy.   5. Attention deficit hyperactivity disorder (ADHD), unspecified ADHD type - neuropsych testing was consistent with ADHD. UDS negative 12/2022      Overall stable.  Will continue current dose of Ritalin  to target ADHD.   6. Insomnia, unspecified type - reportedly struggles with insomnia since age 55. She had CPAP study titration 07/2022.s/p surgery for deviated septum  She reports significant benefit from prazosin  for vivid dreams.  Will continue current dose along with zaleplon  to target insomnia.   Plan Continue fluoxetine  60 mg daily  Continue quetiapine  50 mg at night (on metformin, mounjaro) Continue buspirone  30 mg at night Continue prazosin  2 mg at night - monitor dizziness Continue Zaleplon  20 mg at night as needed for sleep - refill  left Continue Ritalin  10 mg daily Next appointment: 11/13 at 4 pm, IP - on metoprolol  - on mounjaro, 8 months    Past trials of medication: sertraline, Pristiq , bupropion  (some adverse reaction in the past), Abilify (visual change)     The patient demonstrates the following risk factors for suicide: Chronic risk factors for suicide include: psychiatric disorder of depression, anxiety. Acute risk factors for suicide include: family or marital conflict, work related stress. Protective factors for this patient include: positive social support, coping skills and hope for the future. Considering these factors, the overall suicide risk at this point appears to be moderate, but not at imminent risk. Patient is appropriate for outpatient follow up.  Collaboration of Care: Collaboration of Care: Other reviewed notes in Epic  Patient/Guardian was advised Release of Information must be obtained prior to any record release in order to collaborate their care with an outside provider. Patient/Guardian was advised if they have not already done so to contact the registration department to sign all necessary  forms in order for us  to release information regarding their care.   Consent: Patient/Guardian gives verbal consent for treatment and assignment of benefits for services provided during this visit. Patient/Guardian expressed understanding and agreed to proceed.    Katheren Sleet, MD 02/13/2024, 5:16 PM

## 2024-02-11 ENCOUNTER — Other Ambulatory Visit: Payer: Self-pay | Admitting: Psychiatry

## 2024-02-13 ENCOUNTER — Ambulatory Visit (INDEPENDENT_AMBULATORY_CARE_PROVIDER_SITE_OTHER): Admitting: Psychiatry

## 2024-02-13 ENCOUNTER — Encounter: Payer: Self-pay | Admitting: Psychiatry

## 2024-02-13 VITALS — BP 118/72 | HR 63 | Temp 97.6°F | Ht 68.5 in | Wt 297.0 lb

## 2024-02-13 DIAGNOSIS — F410 Panic disorder [episodic paroxysmal anxiety] without agoraphobia: Secondary | ICD-10-CM | POA: Diagnosis not present

## 2024-02-13 DIAGNOSIS — F411 Generalized anxiety disorder: Secondary | ICD-10-CM | POA: Diagnosis not present

## 2024-02-13 DIAGNOSIS — G4700 Insomnia, unspecified: Secondary | ICD-10-CM

## 2024-02-13 DIAGNOSIS — F330 Major depressive disorder, recurrent, mild: Secondary | ICD-10-CM | POA: Diagnosis not present

## 2024-02-13 DIAGNOSIS — F909 Attention-deficit hyperactivity disorder, unspecified type: Secondary | ICD-10-CM

## 2024-02-13 DIAGNOSIS — F4310 Post-traumatic stress disorder, unspecified: Secondary | ICD-10-CM | POA: Diagnosis not present

## 2024-02-13 MED ORDER — FLUOXETINE HCL 20 MG PO CAPS: 20.0000 mg | ORAL_CAPSULE | Freq: Every day | ORAL | 1 refills | Status: AC

## 2024-02-13 MED ORDER — ZALEPLON 10 MG PO CAPS: 20.0000 mg | ORAL_CAPSULE | Freq: Every day | ORAL | 1 refills | Status: DC

## 2024-02-13 NOTE — Patient Instructions (Signed)
 Continue fluoxetine  60 mg daily  Continue quetiapine  50 mg at night  Continue buspirone  30 mg at night Continue prazosin  2 mg at night Continue Zaleplon  20 mg at night as needed for sleep  Continue Ritalin  10 mg daily Next appointment: 11/13 at 4 pm

## 2024-02-19 ENCOUNTER — Telehealth: Payer: Self-pay

## 2024-02-19 NOTE — Telephone Encounter (Signed)
 Medication management - [employer] and spoke to [facility]. pharmacist who reported pt has been getting 60 tablets a month for her ordered Sonata ; however, they are often out so they give her 10 until they get the other 50 into the pharmacy. Per Dr. Giovanni instructions, informed pharmacist he could go ahead and fill the new prescription that is dated to fill on 02/22/24 as he also verified pt last filled the Sonata  for #60, a 30 day supply on 01/18/24.  Left patient a message on her phone this was being filled for her today at her [facility].

## 2024-02-19 NOTE — Telephone Encounter (Signed)
 Could you call the pharmacy to fill zaleplon  today, and inform the patient? Also, could you also ask the pharmacy to clarify why the prescription has been filled in separate increments of 5 and 25 days each time? It seems this may be causing some confusion with refill timing.

## 2024-02-22 ENCOUNTER — Ambulatory Visit: Admitting: Professional Counselor

## 2024-02-28 ENCOUNTER — Ambulatory Visit (INDEPENDENT_AMBULATORY_CARE_PROVIDER_SITE_OTHER): Admitting: Professional Counselor

## 2024-02-28 DIAGNOSIS — F330 Major depressive disorder, recurrent, mild: Secondary | ICD-10-CM | POA: Diagnosis not present

## 2024-02-28 DIAGNOSIS — F411 Generalized anxiety disorder: Secondary | ICD-10-CM | POA: Diagnosis not present

## 2024-02-28 DIAGNOSIS — F4310 Post-traumatic stress disorder, unspecified: Secondary | ICD-10-CM | POA: Diagnosis not present

## 2024-02-28 NOTE — Progress Notes (Unsigned)
  THERAPIST PROGRESS NOTE  Session Time: 4:00 PM - 4:45 PM   Participation Level: Active  Behavioral Response: Casual, Alert, Dysphoric  Type of Therapy: Individual Therapy  Treatment Goals addressed: Active OP Depression  LTG: I'd just be able to cope with the stress because I can't. (Not Progressing)                Start:  08/16/23    Expected End:  08/14/24    Goal Note Reviewed 12/25/2023 - Yeah, that's still a thing. A tiny bit, but I don't know if it's affecting my health.    STG: Coping. To improve ability to cope AEB reduction in breakdowns/crying spells/etc. by using coping skills over the next 12 weeks (Progressing)  Goal Note Reviewed 12/26/23 - I'd say a little bit better but I wouldn't say by a whole lot.  ProgressTowards Goals: Progressing  Interventions: Motivational Interviewing and Supportive  Summary: Martha [name] is a 28 y.o. female who presents with a history of anxiety, depression, trauma, and ADHD. She appeared somber but oriented x5. She shared news that her grandmother passed away March 14, 2024 night. She reported how everyone is responding and their plans for the service. Martha [name] was happy to share that her boyfriend asked her parent's blessing for marriage and they have looked at wedding rings. This just happened over the weekend so they haven't really started planning things yet, but they know they want something small. Martha [name] noted updates with work and school.    Therapist Response: Conducted session with [facility]. Began session with check-in/update since previous session. Utilized empathetic and reflective listening. Used open-ended questions to facilitate discussion and summarized Martha [name]'s thoughts/feelings. Shared in emotions Martha [name] expressed of sadness/grief and upcoming excitement. Noted stressors and encouraged coping skills to manage. Scheduled additional appointment and concluded session.   Suicidal/Homicidal: No  Plan: Return again in 2 weeks.  Diagnosis:  PTSD (post-traumatic stress disorder)  MDD (major depressive disorder), recurrent episode, mild  GAD (generalized anxiety disorder)  Collaboration of Care: Medication Management AEB chart review  Patient/Guardian was advised Release of Information must be obtained prior to any record release in order to collaborate their care with an outside provider. Patient/Guardian was advised if they have not already done so to contact the registration department to sign all necessary forms in order for us  to release information regarding their care.   Consent: Patient/Guardian gives verbal consent for treatment and assignment of benefits for services provided during this visit. Patient/Guardian expressed understanding and agreed to proceed.   [name], [facility] 02/28/2024

## 2024-03-05 ENCOUNTER — Ambulatory Visit: Admitting: Professional Counselor

## 2024-03-12 ENCOUNTER — Ambulatory Visit (INDEPENDENT_AMBULATORY_CARE_PROVIDER_SITE_OTHER): Admitting: Professional Counselor

## 2024-03-12 DIAGNOSIS — F330 Major depressive disorder, recurrent, mild: Secondary | ICD-10-CM | POA: Diagnosis not present

## 2024-03-12 DIAGNOSIS — F4310 Post-traumatic stress disorder, unspecified: Secondary | ICD-10-CM | POA: Diagnosis not present

## 2024-03-12 DIAGNOSIS — F411 Generalized anxiety disorder: Secondary | ICD-10-CM

## 2024-03-12 NOTE — Progress Notes (Signed)
  THERAPIST PROGRESS NOTE  Session Time: 1:00 PM - 1:39 PM  Participation Level: Active  Behavioral Response: Well Groomed, Alert, Euthymic  Type of Therapy: Individual Therapy  Treatment Goals addressed: Active OP Depression  LTG: I'd just be able to cope with the stress because I can't. (Not Progressing)                Start:  08/16/23    Expected End:  08/14/24    Goal Note Reviewed 12/25/2023 - Yeah, that's still a thing. A tiny bit, but I don't know if it's affecting my health.    STG: Coping. To improve ability to cope AEB reduction in breakdowns/crying spells/etc. by using coping skills over the next 12 weeks (Progressing)  Goal Note Reviewed 12/26/23 - I'd say a little bit better but I wouldn't say by a whole lot.  ProgressTowards Goals: Progressing  Interventions: Motivational Interviewing and Supportive  Summary: Martha [name] is a 28 y.o. female who presents with a history of anxiety, depression, and trauma. She appeared alert and oriented x5. She reported things have been busy since her grandmother's passing, but they have dealt with most of it. Martha [name] reported they cleaned out their mother's room at the nursing home. Her service went well. She noted her father seems to be drinking more and hasn't contacted the lawyer about her grandmother's estate yet. Martha [name] shared things she is looking forward to, like her official engagement, wedding, honeymoon, and finding a place to live together. She discussed various plans and thoughts for the future. 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 She hopes this position will allow her to end her FMLA and resume a normal working schedule.   Therapist Response: Conducted session with [facility]. Began session with check-in/update since previous session. Utilized empathetic and reflective listening. Used open-ended questions to facilitate discussion and summarized  Martha [name]'s thoughts/feelings. Processed recent events with grandmother's passing and explored upcoming events to look forward to. Scheduled additional appointment and concluded session.   Suicidal/Homicidal: No  Plan: Return again in 4 weeks.  Diagnosis: GAD (generalized anxiety disorder)  MDD (major depressive disorder), recurrent episode, mild  PTSD (post-traumatic stress disorder)  Collaboration of Care: Medication Management AEB chart review  Patient/Guardian was advised Release of Information must be obtained prior to any record release in order to collaborate their care with an outside provider. Patient/Guardian was advised if they have not already done so to contact the registration department to sign all necessary forms in order for us  to release information regarding their care.   Consent: Patient/Guardian gives verbal consent for treatment and assignment of benefits for services provided during this visit. Patient/Guardian expressed understanding and agreed to proceed.   Martha [name], [facility] 03/12/2024

## 2024-04-07 NOTE — Progress Notes (Unsigned)
 BH MD/PA/NP OP Progress Note  04/07/2024 12:26 PM Martha [name]  MRN:  [id number]  Chief Complaint: No chief complaint on file.  HPI: ***  50 tabs, 30 days,   Visit Diagnosis: No diagnosis found.  Past Psychiatric History: Please see initial evaluation for full details. I have reviewed the history. No updates at this time.     Past Medical History:  Past Medical History:  Diagnosis Date   ADHD (attention deficit hyperactivity disorder)    no meds taken   Allergy    Anxiety    Complication of anesthesia    woke up during colonoscopy and dental surgery.   Depression    Diabetes mellitus    pre-diabetic,told when younger   Family history of breast cancer    12/20 cancer genetic testing letter sent   Gastritis    GERD (gastroesophageal reflux disease)    Hashimoto's disease    Headache(784.0)    chronic migraines, decrease since BCP started   History of hiatal hernia    Hypertension    Idiopathic intracranial hypertension    Insomnia    Irregular menstrual cycle    pt states she started BCP,unsure of name to help regulate cycle   Obesity    Sleep apnea    Vision abnormalities    near sighted,wears glasses    Past Surgical History:  Procedure Laterality Date   CHOLECYSTECTOMY     COLONOSCOPY WITH PROPOFOL  N/A 12/03/2021   Procedure: COLONOSCOPY WITH PROPOFOL ;  Surgeon: Maryruth Ole DASEN, MD;  Location: [facility];  Service: Endoscopy;  Laterality: N/A;   ENDOSCOPIC CONCHA BULLOSA RESECTION Right 10/12/2022   Procedure: ENDOSCOPIC CONCHA BULLOSA RESECTION;  Surgeon: Edda Mt, MD;  Location: [facility];  Service: ENT;  Laterality: Right;   ESOPHAGOGASTRODUODENOSCOPY (EGD) WITH PROPOFOL  N/A 12/03/2021   Procedure: ESOPHAGOGASTRODUODENOSCOPY (EGD) WITH PROPOFOL ;  Surgeon: Maryruth Ole DASEN, MD;  Location: [facility];  Service: Endoscopy;  Laterality: N/A;  DM   ETHMOIDECTOMY Bilateral 10/12/2022   Procedure: TOTAL ETHMOIDECTOMY;  Surgeon: Edda Mt, MD;   Location: [facility];  Service: ENT;  Laterality: Bilateral;   IMAGE GUIDED SINUS SURGERY Bilateral 10/12/2022   Procedure: IMAGE GUIDED SINUS SURGERY;  Surgeon: Edda Mt, MD;  Location: [facility];  Service: ENT;  Laterality: Bilateral;   SEPTOPLASTY N/A 10/12/2022   Procedure: SEPTOPLASTY;  Surgeon: Edda Mt, MD;  Location: [facility];  Service: ENT;  Laterality: N/A;   TONSILLECTOMY     at 28yo   UPPER GI ENDOSCOPY  01/2012    Family Psychiatric History: Please see initial evaluation for full details. I have reviewed the history. No updates at this time.     Family History:  Family History  Problem Relation Age of Onset   Thyroid  cancer Mother 56   Depression Mother    Hypertension Father    Anxiety disorder Father    Diabetes type II Father    Skin cancer Maternal Grandfather    Alzheimer's disease Maternal Grandfather    Alzheimer's disease Paternal Grandmother    Leukemia Paternal Aunt    Breast cancer Paternal Aunt 40   Hypertension Maternal Grandmother    Diabetes type II Maternal Grandmother     Social History:  Social History   Socioeconomic History   Marital status: Single    Spouse name: Not on file   Number of children: Not on file   Years of education: Not on file   Highest education level: Not on file  Occupational History   Occupation:  Student    Comment: [employer] 12th grade  Tobacco Use   Smoking status: Never   Smokeless tobacco: Never  Vaping Use   Vaping status: Former   Quit date: 10/30/2018  Substance and Sexual Activity   Alcohol use: Yes    Comment: occ   Drug use: No   Sexual activity: Not Currently    Partners: Male    Birth control/protection: Pill  Other Topics Concern   Not on file  Social History Narrative   Not on file   Social Drivers of Health   Financial Resource Strain: Low Risk  (07/12/2023)   Overall Financial Resource Strain (CARDIA)    Difficulty of Paying Living Expenses: Not hard at all  Food Insecurity:  No Food Insecurity (07/12/2023)   Hunger Vital Sign    Worried About Running Out of Food in the Last Year: Never true    Ran Out of Food in the Last Year: Never true  Transportation Needs: No Transportation Needs (07/12/2023)   PRAPARE - Administrator, Civil Service (Medical): No    Lack of Transportation (Non-Medical): No  Physical Activity: Insufficiently Active (07/12/2023)   Exercise Vital Sign    Days of Exercise per Week: 5 days    Minutes of Exercise per Session: 10 min  Stress: Stress Concern Present (07/12/2023)   [employer] of Occupational Health - Occupational Stress Questionnaire    Feeling of Stress : To some extent  Social Connections: Moderately Isolated (07/12/2023)   Social Connection and Isolation Panel    Frequency of Communication with Friends and Family: More than three times a week    Frequency of Social Gatherings with Friends and Family: Once a week    Attends Religious Services: More than 4 times per year    Active Member of [employer] or Organizations: No    Attends Banker Meetings: Never    Marital Status: Never married    Allergies:  Allergies  Allergen Reactions   Doxycycline Other (See Comments)   Lamisil [Terbinafine]    Wellbutrin  [Bupropion ] Other (See Comments)    Suicidal Ideation    Wound Dressing Adhesive Hives    I welp up    Metabolic Disorder Labs: Lab Results  Component Value Date   HGBA1C 6.5 (H) 04/19/2021   No results found for: PROLACTIN Lab Results  Component Value Date   CHOL 151 04/20/2020   TRIG 136 04/20/2020   HDL 40 04/20/2020   CHOLHDL 3.8 04/20/2020   LDLCALC 87 04/20/2020   Lab Results  Component Value Date   TSH 3.216 03/01/2023   TSH 6.670 (H) 04/20/2020    Therapeutic Level Labs: No results found for: LITHIUM No results found for: VALPROATE No results found for: CBMZ  Current Medications: Current Outpatient Medications  Medication Sig Dispense Refill   ascorbic  acid (VITAMIN C) 1000 MG tablet Take 1,000 mg by mouth daily.     calcium carbonate (OSCAL) 1500 (600 Ca) MG TABS tablet Take 600 mg of elemental calcium by mouth daily with breakfast.     Cholecalciferol (VITAMIN D -1000 MAX ST) 25 MCG (1000 UT) tablet Take 1,000 Units by mouth daily.     FLUoxetine  (PROZAC ) 20 MG capsule Take 1 capsule (20 mg total) by mouth daily. Total of 60 mg daily. Take along with 40 mg cap 90 capsule 1   FLUoxetine  (PROZAC ) 40 MG capsule Take 1 capsule (40 mg total) by mouth daily. Total of 60 mg daily, take along with  20 mg cap 90 capsule 1   furosemide (LASIX) 20 MG tablet Take 20 mg by mouth daily.     hydrochlorothiazide (MICROZIDE) 12.5 MG capsule Take 12.5 mg by mouth daily. (Patient not taking: Reported on 02/13/2024)     levothyroxine (SYNTHROID) 75 MCG tablet Take 75 mcg by mouth daily before breakfast.     magnesium oxide (MAG-OX) 400 MG tablet Take 400 mg by mouth daily.     metFORMIN (GLUCOPHAGE) 500 MG tablet Take 250 mg by mouth at bedtime as needed.     methylphenidate  (RITALIN ) 10 MG tablet Take 1 tablet (10 mg total) by mouth daily. 30 tablet 0   methylphenidate  (RITALIN ) 10 MG tablet Take 1 tablet (10 mg total) by mouth daily before breakfast. 30 tablet 0   metoprolol tartrate (LOPRESSOR) 50 MG tablet Take 50 mg by mouth 2 (two) times daily.     MOUNJARO 10 MG/0.5ML Pen Inject 10 mg into the skin once a week.     Multiple Vitamin (MULTI-VITAMIN) tablet Take by mouth.     Multiple Vitamins-Minerals (ZINC  PO) Take 1 tablet by mouth daily.     norethindrone  (MICRONOR ) 0.35 MG tablet Take 1 tablet by mouth daily.     pantoprazole (PROTONIX) 40 MG tablet Take 40 mg by mouth at bedtime.     prazosin  (MINIPRESS ) 2 MG capsule Take 1 capsule (2 mg total) by mouth at bedtime. 90 capsule 0   promethazine (PHENERGAN) 12.5 MG tablet Take 12.5 mg by mouth every 6 (six) hours as needed for nausea or vomiting.     QUEtiapine  (SEROQUEL ) 50 MG tablet Take 1 tablet (50 mg  total) by mouth at bedtime. 90 tablet 0   spironolactone (ALDACTONE) 100 MG tablet Take 200 mg by mouth daily.  9   tizanidine (ZANAFLEX) 2 MG capsule Take 2 mg by mouth at bedtime as needed for muscle spasms.     Ubrogepant (UBRELVY PO) Take 1 tablet by mouth as needed.     vitamin B-12 (CYANOCOBALAMIN) 1000 MCG tablet Take 1,000 mcg by mouth daily.     zaleplon  (SONATA ) 10 MG capsule Take 2 capsules (20 mg total) by mouth at bedtime. 60 capsule 1   No current facility-administered medications for this visit.     Musculoskeletal: Strength & Muscle Tone: normal Gait & Station: normal Patient leans: N/A  Psychiatric Specialty Exam: Review of Systems  There were no vitals taken for this visit.There is no height or weight on file to calculate BMI.  General Appearance: {Appearance:22683}  Eye Contact:  {BHH EYE CONTACT:22684}  Speech:  Clear and Coherent  Volume:  Normal  Mood:  {BHH MOOD:22306}  Affect:  {Affect (PAA):22687}  Thought Process:  Coherent  Orientation:  Full (Time, Place, and Person)  Thought Content: Logical   Suicidal Thoughts:  {ST/HT (PAA):22692}  Homicidal Thoughts:  {ST/HT (PAA):22692}  Memory:  Immediate;   Good  Judgement:  {Judgement (PAA):22694}  Insight:  {Insight (PAA):22695}  Psychomotor Activity:  Normal  Concentration:  Concentration: Good and Attention Span: Good  Recall:  Good  Fund of Knowledge: Good  Language: Good  Akathisia:  No  Handed:  Right  AIMS (if indicated): not done  Assets:  Communication Skills  ADL's:  Intact  Cognition: WNL  Sleep:  {BHH GOOD/FAIR/POOR:22877}   Screenings: GAD-7    Loss Adjuster, Chartered Office Visit from 02/13/2024 in [facility] Office Visit from 12/19/2023 in [facility] Counselor from 07/12/2023 in [facility] Office Visit from 06/08/2023 in [facility]  Office Visit from 02/27/2023 in [facility]  Total GAD-7 Score 7 7 6 7 14    PHQ2-9    Flowsheet Row Office Visit from 02/13/2024 in [facility] Office Visit from 12/19/2023 in [facility] Office Visit from 09/25/2023 in [facility] Counselor from 07/12/2023 in [facility] Office Visit from 06/08/2023 in [facility]  PHQ-2 Total Score 2 3 2 2 2   PHQ-9 Total Score 9 10 9 9 9    Flowsheet Row Office Visit from 02/13/2024 in [facility] Office Visit from 12/19/2023 in [facility] ED from 10/17/2023 in [facility] Department at [facility]  C-SSRS RISK CATEGORY No Risk Error: Q3, 4, or 5 should not be populated when Q2 is No No Risk     Assessment and Plan:  LUS KRIEGEL is a 28 y.o. year old female with a history of depression, anxiety, ADHD, migraine, GERD, who presents for follow up appointment for below.     1. PTSD (post-traumatic stress disorder) 2. MDD (major depressive disorder), recurrent episode, mild (HCC) 3. GAD (generalized anxiety disorder) 4. Panic disorder She describes a childhood marked by emotional neglect --receiving material items such as toys but lacking emotional nurturing. Her mother frequently told her to lose weight as a child and she has a fear of abandonment. She has also experienced significant losses, including the death of maternal grandmother due to COVID-19 and the decline of paternal grandmother who is currently in a nursing facility with Alzheimer's disease.   History: Tx from [facility]. Depression, anxiety, insomnia for many years. admitted once in 2013 for depression. Originally on Pristiq  100 mg daily, buspirone  30 mg qhs, zaleplon  20  mg qhs, Ritalin  10 mg daily.    TMS was discussed- if she obtains other insurance (currently on Medicaid) Although she continues to experience PTSD, depressive symptoms and anxiety related to her grandmother's condition, things has been relatively manageable. She finds the current workplace to be helpful, and has started school for coding as well. She has not noticed any difference since lowering the dose of fluoxetine ; will continue current dose to target PTSD, depression and anxiety.  Will continue quetiapine  as adjunctive treatment for depression.  Will continue BuSpar  for anxiety, and prazosin  for disturbed sleep.  She will continue to see Ms. Veva for therapy.    5. Attention deficit hyperactivity disorder (ADHD), unspecified ADHD type - neuropsych testing was consistent with ADHD. UDS negative 12/2022      Overall stable.  Will continue current dose of Ritalin  to target ADHD.    6. Insomnia, unspecified type - reportedly struggles with insomnia since age 34. She had CPAP study titration 07/2022.s/p surgery for deviated septum  She reports significant benefit from prazosin  for vivid dreams.  Will continue current dose along with zaleplon  to target insomnia.    Plan Continue fluoxetine  60 mg daily  Continue quetiapine  50 mg at night (on metformin, mounjaro) Continue buspirone  30 mg at night Continue prazosin  2 mg at night - monitor dizziness Continue Zaleplon  20 mg at night as needed for sleep - refill left Continue Ritalin  10 mg daily Next appointment: 11/13 at 4 pm, IP - on metoprolol  - on mounjaro, 8 months    Past trials of  medication: sertraline, Pristiq , bupropion  (some adverse reaction in the past), Abilify (visual change)     The patient demonstrates the following risk factors for suicide: Chronic risk factors for suicide include: psychiatric disorder of depression, anxiety. Acute risk factors for suicide include: family or marital conflict, work related stress. Protective  factors for this patient include: positive social support, coping skills and hope for the future. Considering these factors, the overall suicide risk at this point appears to be moderate, but not at imminent risk. Patient is appropriate for outpatient follow up.  Collaboration of Care: Collaboration of Care: {BH OP Collaboration of Care:21014065}  Patient/Guardian was advised Release of Information must be obtained prior to any record release in order to collaborate their care with an outside provider. Patient/Guardian was advised if they have not already done so to contact the registration department to sign all necessary forms in order for us  to release information regarding their care.   Consent: Patient/Guardian gives verbal consent for treatment and assignment of benefits for services provided during this visit. Patient/Guardian expressed understanding and agreed to proceed.    Katheren Sleet, MD 04/07/2024, 12:26 PM

## 2024-04-09 ENCOUNTER — Ambulatory Visit (INDEPENDENT_AMBULATORY_CARE_PROVIDER_SITE_OTHER): Admitting: Professional Counselor

## 2024-04-09 DIAGNOSIS — F330 Major depressive disorder, recurrent, mild: Secondary | ICD-10-CM

## 2024-04-09 DIAGNOSIS — F4310 Post-traumatic stress disorder, unspecified: Secondary | ICD-10-CM

## 2024-04-09 DIAGNOSIS — F909 Attention-deficit hyperactivity disorder, unspecified type: Secondary | ICD-10-CM | POA: Diagnosis not present

## 2024-04-09 DIAGNOSIS — F411 Generalized anxiety disorder: Secondary | ICD-10-CM | POA: Diagnosis not present

## 2024-04-09 NOTE — Progress Notes (Unsigned)
  THERAPIST PROGRESS NOTE  Session Time: 3:10 PM - 3:55 PM  Participation Level: Active  Behavioral Response: Well Groomed, Alert, Euthymic  Type of Therapy: Individual Therapy  Treatment Goals addressed: Active OP Depression  LTG: I'd just be able to cope with the stress because I can't. (Progressing)    Start:  08/16/23    Expected End:  08/14/24    Goal Note Reviewed 04/09/24 - I'd say I've improved a little bit. It's not as, like I can see a little in my health but it's switched places, like having more tension in my neck. Mentally I'm not as exhausted but my body will feel it later.   STG: Coping. To improve ability to cope AEB reduction in breakdowns/crying spells/etc. by using coping skills over the next 12 weeks (Progressing)  Goal Note Reviewed 04/09/24 - Quite a bit better. I'm not breaking down as much and I'm able to process the feelings more.  ProgressTowards Goals: Progressing  Interventions: Motivational Interviewing and Supportive  Summary: AMANIE MCCULLEY is a 28 y.o. female who presents with a history of anxiety, depression, trauma, and ADHD. She appeared alert and oriented x5. She stated she is still moving through the grieving process over her grandmother. She reported updates with work and is still waiting for the full transition to the new position. Lenetta shared excitement over her engagement and should have her ring soon. She discussed potential housing plans but noted they will likely be waiting until next year to make any moves. Pansy identified progress on goals and areas for continued improvement.  Therapist Response: Conducted session with [facility]. Began session with check-in/update since previous session. Utilized empathetic and reflective listening. Used open-ended questions to facilitate discussion and summarized Anahlia's thoughts/feelings. Reviewed treatment plan with input from [facility] on current strengths, needs, and progress towards goals. Scheduled  additional appointment and concluded session.   Suicidal/Homicidal: No  Plan: Return again in 4 weeks.  Diagnosis: GAD (generalized anxiety disorder)  MDD (major depressive disorder), recurrent episode, mild  PTSD (post-traumatic stress disorder)  Attention deficit hyperactivity disorder (ADHD), unspecified ADHD type  Collaboration of Care: Medication Management AEB chart review  Patient/Guardian was advised Release of Information must be obtained prior to any record release in order to collaborate their care with an outside provider. Patient/Guardian was advised if they have not already done so to contact the registration department to sign all necessary forms in order for us  to release information regarding their care.   Consent: Patient/Guardian gives verbal consent for treatment and assignment of benefits for services provided during this visit. Patient/Guardian expressed understanding and agreed to proceed.   [name], [facility] 04/09/2024

## 2024-04-11 ENCOUNTER — Other Ambulatory Visit: Payer: Self-pay

## 2024-04-11 ENCOUNTER — Ambulatory Visit (INDEPENDENT_AMBULATORY_CARE_PROVIDER_SITE_OTHER): Admitting: Psychiatry

## 2024-04-11 ENCOUNTER — Encounter: Payer: Self-pay | Admitting: Psychiatry

## 2024-04-11 VITALS — BP 130/83 | HR 73 | Temp 97.8°F | Ht 68.5 in | Wt 289.8 lb

## 2024-04-11 DIAGNOSIS — F4310 Post-traumatic stress disorder, unspecified: Secondary | ICD-10-CM | POA: Diagnosis not present

## 2024-04-11 DIAGNOSIS — F330 Major depressive disorder, recurrent, mild: Secondary | ICD-10-CM | POA: Diagnosis not present

## 2024-04-11 DIAGNOSIS — G4700 Insomnia, unspecified: Secondary | ICD-10-CM

## 2024-04-11 DIAGNOSIS — F411 Generalized anxiety disorder: Secondary | ICD-10-CM

## 2024-04-11 DIAGNOSIS — F909 Attention-deficit hyperactivity disorder, unspecified type: Secondary | ICD-10-CM

## 2024-04-11 DIAGNOSIS — F410 Panic disorder [episodic paroxysmal anxiety] without agoraphobia: Secondary | ICD-10-CM

## 2024-04-11 MED ORDER — QUETIAPINE FUMARATE 50 MG PO TABS: 50.0000 mg | ORAL_TABLET | Freq: Every day | ORAL | 0 refills | Status: AC

## 2024-04-11 MED ORDER — ZALEPLON 10 MG PO CAPS: 20.0000 mg | ORAL_CAPSULE | Freq: Every day | ORAL | 1 refills | Status: DC

## 2024-04-11 MED ORDER — METHYLPHENIDATE HCL 10 MG PO TABS: 10.0000 mg | ORAL_TABLET | Freq: Every day | ORAL | 0 refills | Status: DC

## 2024-04-11 MED ORDER — METHYLPHENIDATE HCL 10 MG PO TABS: 10.0000 mg | ORAL_TABLET | Freq: Every day | ORAL | 0 refills | Status: AC

## 2024-04-11 MED ORDER — PRAZOSIN HCL 2 MG PO CAPS: 2.0000 mg | ORAL_CAPSULE | Freq: Every day | ORAL | 0 refills | Status: DC

## 2024-04-11 NOTE — Patient Instructions (Signed)
 Continue fluoxetine  60 mg daily  Continue quetiapine  50 mg at night Continue buspirone  30 mg at night Continue prazosin  2 mg at night Continue Zaleplon  20 mg at night as needed for sleep  Continue Ritalin  10 mg daily Next appointment: 1/12 at 4:30

## 2024-05-07 ENCOUNTER — Ambulatory Visit: Admitting: Professional Counselor

## 2024-05-07 DIAGNOSIS — F909 Attention-deficit hyperactivity disorder, unspecified type: Secondary | ICD-10-CM

## 2024-05-07 DIAGNOSIS — F411 Generalized anxiety disorder: Secondary | ICD-10-CM

## 2024-05-07 DIAGNOSIS — F330 Major depressive disorder, recurrent, mild: Secondary | ICD-10-CM

## 2024-05-07 NOTE — Progress Notes (Signed)
 THERAPIST PROGRESS NOTE  Virtual Visit via Video Note  I connected with Martha [name] on 05/07/24 at  4:00 PM EST by a video enabled telemedicine application and verified that I am speaking with the correct person using two identifiers.  Location: Patient: Home Provider: Office   I discussed the limitations of evaluation and management by telemedicine and the availability of in person appointments. The patient expressed understanding and agreed to proceed.  I discussed the assessment and treatment plan with the patient. The patient was provided an opportunity to ask questions and all were answered. The patient agreed with the plan and demonstrated an understanding of the instructions.   The patient was advised to call back or seek an in-person evaluation if the symptoms worsen or if the condition fails to improve as anticipated.  I provided 45 minutes of non-face-to-face time during this encounter. Martha [name], [facility]  Session Time: 4:00 PM - 4:45 PM  Participation Level: Active  Behavioral Response: Casual, Alert, Euthymic  Type of Therapy: Individual Therapy  Treatment Goals addressed: Active OP Depression  LTG: I'd just be able to cope with the stress because I can't. (Progressing)                Start:  08/16/23    Expected End:  08/14/24    Goal Note Reviewed 04/09/24 - I'd say I've improved a little bit. It's not as, like I can see a little in my health but it's switched places, like having more tension in my neck. Mentally I'm not as exhausted but my body will feel it later.    STG: Coping. To improve ability to cope AEB reduction in breakdowns/crying spells/etc. by using coping skills over the next 12 weeks (Progressing)  Goal Note Reviewed 04/09/24 - Quite a bit better. I'm not breaking down as much and I'm able to process the feelings more.  ProgressTowards Goals: Progressing  Interventions: Motivational Interviewing and Supportive  Summary: Martha [name] is a 28 y.o. female who presents with a history of anxiety, depression, trauma, and ADHD. She appeared alert and oriented x5. She reported she is home today due to the weather. Martha [name] reported she is officially engaged and showed her ring. She shared the story of the engagement. She noted upcoming plans for finding housing. She provided updates with work and plans to end FMLA at the beginning of the new year. She shared about a recent outing with friends and was proud of being out in public. She discussed the holidays and plans for celebrating.   Therapist Response: Conducted session with [employer]. Began session with check-in/update since previous session. Utilized empathetic and reflective listening. Used open-ended questions to facilitate discussion and summarized Rachna's thoughts/feelings. Provided positive reinforcement for exposure to socialization. Scheduled additional appointment and concluded session.   Suicidal/Homicidal: No  Plan: Return again in 4 weeks.  Diagnosis: MDD (major depressive disorder), recurrent episode, mild  GAD (generalized anxiety disorder)  Attention deficit hyperactivity disorder (ADHD), unspecified ADHD type  Collaboration of Care: Medication Management AEB chart review  Patient/Guardian was advised Release of Information must be obtained prior to any record release in order to collaborate their care with an outside provider. Patient/Guardian was advised if they have not already done so to contact the registration department to sign all necessary forms in order for us  to release information regarding their care.   Consent: Patient/Guardian gives verbal consent for treatment and assignment of benefits for services provided during this visit. Patient/Guardian expressed understanding  and agreed to proceed.   Martha [name], [facility] 05/07/2024

## 2024-06-04 NOTE — Progress Notes (Addendum)
 "   BH MD/PA/NP OP Progress Note  06/10/2024 5:06 PM Martha [name]  MRN:  [id number]  Chief Complaint:  Chief Complaint  Patient presents with   Follow-up   HPI:  This is a follow-up appointment for depression, anxiety and insomnia.  She states that she got engaged.  She has new goals for this year.  She will be looking for a mobile home.  She has been discussing with her parents and her brother.  Although she feels excited, reports stress related to this.  She may get married through the court system if any concern about financial strain.  Reports good relationship with her fianc, who respect her wishes about the intimacy.  She talked with them to drop FMLA as she has been doing very well at the current position.  She will be there permanently.  Although she occasionally notices a little more difficulty in concentration, it has been manageable.  Although she reports slightly heard time during Christmas due to losses of her family members, she was also able to help her friend, who was out from the relationship.  She states that her depression is not as bad.  It tends to happen when it is raining or having that day at work.  Although she reports passive SI on those occasion, it is random thoughts and she is able to redirect herself that people care about her.  She denies any intent of plan, and agreed to contact emergency resources if any worsening.  She denies hallucinations.  She agrees with the plans as outlined below.    Wt Readings from Last 3 Encounters:  06/10/24 294 lb 12.8 oz (133.7 kg)  04/11/24 289 lb 12.8 oz (131.5 kg)  02/13/24 297 lb (134.7 kg)      Visit Diagnosis:    ICD-10-CM   1. PTSD (post-traumatic stress disorder)  F43.10     2. Insomnia, unspecified type  G47.00 zaleplon  (SONATA ) 10 MG capsule    3. MDD (major depressive disorder), recurrent episode, mild  F33.0     4. GAD (generalized anxiety disorder)  F41.1     5. Attention deficit hyperactivity disorder  (ADHD), unspecified ADHD type  F90.9       Past Psychiatric History: Please see initial evaluation for full details. I have reviewed the history. No updates at this time.     Past Medical History:  Past Medical History:  Diagnosis Date   ADHD (attention deficit hyperactivity disorder)    no meds taken   Allergy    Anxiety    Complication of anesthesia    woke up during colonoscopy and dental surgery.   Depression    Diabetes mellitus    pre-diabetic,told when younger   Family history of breast cancer    12/20 cancer genetic testing letter sent   Gastritis    GERD (gastroesophageal reflux disease)    Hashimoto's disease    Headache(784.0)    chronic migraines, decrease since BCP started   History of hiatal hernia    Hypertension    Idiopathic intracranial hypertension    Insomnia    Irregular menstrual cycle    pt states she started BCP,unsure of name to help regulate cycle   Obesity    Sleep apnea    Vision abnormalities    near sighted,wears glasses    Past Surgical History:  Procedure Laterality Date   CHOLECYSTECTOMY     COLONOSCOPY WITH PROPOFOL  N/A 12/03/2021   Procedure: COLONOSCOPY WITH PROPOFOL ;  Surgeon: Maryruth Smalls  T, MD;  Location: [facility] END[facility];  Service: Endoscopy;  Laterality: N/A;   ENDOSCOPIC CONCHA BULLOSA RESECTION Right 10/12/2022   Procedure: ENDOSCOPIC CONCHA BULLOSA RESECTION;  Surgeon: Edda Mt, MD;  Location: [facility] ORS;  Service: ENT;  Laterality: Right;   ESOPHAGOGASTRODUODENOSCOPY (EGD) WITH PROPOFOL  N/A 12/03/2021   Procedure: ESOPHAGOGASTRODUODENOSCOPY (EGD) WITH PROPOFOL ;  Surgeon: Maryruth Ole DASEN, MD;  Location: [facility] ENDOSCOPY;  Service: Endoscopy;  Laterality: N/A;  DM   ETHMOIDECTOMY Bilateral 10/12/2022   Procedure: TOTAL ETHMOIDECTOMY;  Surgeon: Edda Mt, MD;  Location: [facility] ORS;  Service: ENT;  Laterality: Bilateral;   IMAGE GUIDED SINUS SURGERY Bilateral 10/12/2022   Procedure: IMAGE GUIDED SINUS SURGERY;  Surgeon:  Edda Mt, MD;  Location: [facility] ORS;  Service: ENT;  Laterality: Bilateral;   SEPTOPLASTY N/A 10/12/2022   Procedure: SEPTOPLASTY;  Surgeon: Edda Mt, MD;  Location: [facility] ORS;  Service: ENT;  Laterality: N/A;   TONSILLECTOMY     at 29yo   UPPER GI ENDOSCOPY  01/2012    Family Psychiatric History: Please see initial evaluation for full details. I have reviewed the history. No updates at this time.     Family History:  Family History  Problem Relation Age of Onset   Thyroid  cancer Mother 35   Depression Mother    Hypertension Father    Anxiety disorder Father    Diabetes type II Father    Skin cancer Maternal Grandfather    Alzheimer's disease Maternal Grandfather    Alzheimer's disease Paternal Grandmother    Leukemia Paternal Aunt    Breast cancer Paternal Aunt 40   Hypertension Maternal Grandmother    Diabetes type II Maternal Grandmother     Social History:  Social History   Socioeconomic History   Marital status: Single    Spouse name: Not on file   Number of children: 0   Years of education: Not on file   Highest education level: Not on file  Occupational History   Occupation: Consulting Civil Engineer    Comment: [employer] 12th grade  Tobacco Use   Smoking status: Never   Smokeless tobacco: Never  Vaping Use   Vaping status: Former   Quit date: 10/30/2018  Substance and Sexual Activity   Alcohol use: Yes    Comment: occ   Drug use: No   Sexual activity: Not Currently    Partners: Male    Birth control/protection: Pill  Other Topics Concern   Not on file  Social History Narrative   Not on file   Social Drivers of Health   Tobacco Use: Low Risk (06/10/2024)   Patient History    Smoking Tobacco Use: Never    Smokeless Tobacco Use: Never    Passive Exposure: Not on file  Financial Resource Strain: Low Risk (07/12/2023)   Overall Financial Resource Strain (CARDIA)    Difficulty of Paying Living Expenses: Not hard at all  Food Insecurity: No Food Insecurity  (07/12/2023)   Hunger Vital Sign    Worried About Running Out of Food in the Last Year: Never true    Ran Out of Food in the Last Year: Never true  Transportation Needs: No Transportation Needs (07/12/2023)   PRAPARE - Administrator, Civil Service (Medical): No    Lack of Transportation (Non-Medical): No  Physical Activity: Insufficiently Active (07/12/2023)   Exercise Vital Sign    Days of Exercise per Week: 5 days    Minutes of Exercise per Session: 10 min  Stress: Stress Concern Present (07/12/2023)  [employer] of Occupational Health - Occupational Stress Questionnaire    Feeling of Stress : To some extent  Social Connections: Moderately Isolated (07/12/2023)   Social Connection and Isolation Panel    Frequency of Communication with Friends and Family: More than three times a week    Frequency of Social Gatherings with Friends and Family: Once a week    Attends Religious Services: More than 4 times per year    Active Member of Clubs or Organizations: No    Attends Banker Meetings: Never    Marital Status: Never married  Depression (PHQ2-9): Medium Risk (04/09/2024)   Depression (PHQ2-9)    PHQ-2 Score: 9  Alcohol Screen: Low Risk (07/12/2023)   Alcohol Screen    Last Alcohol Screening Score (AUDIT): 1  Housing: Low Risk (07/12/2023)   Housing Stability Vital Sign    Unable to Pay for Housing in the Last Year: No    Number of Times Moved in the Last Year: 0    Homeless in the Last Year: No  Utilities: Not At Risk (07/12/2023)   AHC Utilities    Threatened with loss of utilities: No  Health Literacy: Adequate Health Literacy (07/12/2023)   B1300 Health Literacy    Frequency of need for help with medical instructions: Rarely    Allergies: Allergies[1]  Metabolic Disorder Labs: Lab Results  Component Value Date   HGBA1C 6.5 (H) 04/19/2021   No results found for: PROLACTIN Lab Results  Component Value Date   CHOL 151 04/20/2020   TRIG 136  04/20/2020   HDL 40 04/20/2020   CHOLHDL 3.8 04/20/2020   LDLCALC 87 04/20/2020   Lab Results  Component Value Date   TSH 3.216 03/01/2023   TSH 6.670 (H) 04/20/2020    Therapeutic Level Labs: No results found for: LITHIUM No results found for: VALPROATE No results found for: CBMZ  Current Medications: Current Outpatient Medications  Medication Sig Dispense Refill   busPIRone  (BUSPAR ) 30 MG tablet Take 1 tablet (30 mg total) by mouth at bedtime. 90 tablet 1   ascorbic acid (VITAMIN C) 1000 MG tablet Take 1,000 mg by mouth daily.     calcium carbonate (OSCAL) 1500 (600 Ca) MG TABS tablet Take 600 mg of elemental calcium by mouth daily with breakfast.     Cholecalciferol (VITAMIN D -1000 MAX ST) 25 MCG (1000 UT) tablet Take 1,000 Units by mouth daily.     FLUoxetine  (PROZAC ) 20 MG capsule Take 1 capsule (20 mg total) by mouth daily. Total of 60 mg daily. Take along with 40 mg cap 90 capsule 1   FLUoxetine  (PROZAC ) 40 MG capsule Take 1 capsule (40 mg total) by mouth daily. Total of 60 mg daily, take along with 20 mg cap 90 capsule 1   furosemide (LASIX) 20 MG tablet Take 20 mg by mouth daily.     hydrochlorothiazide (MICROZIDE) 12.5 MG capsule Take 12.5 mg by mouth daily. (Patient not taking: Reported on 02/13/2024)     levothyroxine (SYNTHROID) 75 MCG tablet Take 75 mcg by mouth daily before breakfast.     magnesium oxide (MAG-OX) 400 MG tablet Take 400 mg by mouth daily.     metFORMIN (GLUCOPHAGE) 500 MG tablet Take 250 mg by mouth at bedtime as needed.     methylphenidate  (RITALIN ) 10 MG tablet Take 1 tablet (10 mg total) by mouth daily before breakfast. 30 tablet 0   [START ON 07/10/2024] methylphenidate  (RITALIN ) 10 MG tablet Take 1 tablet (10 mg total) by  mouth daily. 30 tablet 0   metoprolol tartrate (LOPRESSOR) 50 MG tablet Take 50 mg by mouth 2 (two) times daily.     MOUNJARO 10 MG/0.5ML Pen Inject 10 mg into the skin once a week.     Multiple Vitamin (MULTI-VITAMIN) tablet  Take by mouth.     Multiple Vitamins-Minerals (ZINC  PO) Take 1 tablet by mouth daily.     norethindrone  (MICRONOR ) 0.35 MG tablet Take 1 tablet by mouth daily.     pantoprazole (PROTONIX) 40 MG tablet Take 40 mg by mouth at bedtime.     [START ON 07/10/2024] prazosin  (MINIPRESS ) 2 MG capsule Take 1 capsule (2 mg total) by mouth at bedtime. 90 capsule 0   promethazine (PHENERGAN) 12.5 MG tablet Take 12.5 mg by mouth every 6 (six) hours as needed for nausea or vomiting.     QUEtiapine  (SEROQUEL ) 50 MG tablet Take 1 tablet (50 mg total) by mouth at bedtime. 90 tablet 0   spironolactone (ALDACTONE) 100 MG tablet Take 200 mg by mouth daily.  9   tizanidine (ZANAFLEX) 2 MG capsule Take 2 mg by mouth at bedtime as needed for muscle spasms.     Ubrogepant (UBRELVY PO) Take 1 tablet by mouth as needed.     vitamin B-12 (CYANOCOBALAMIN) 1000 MCG tablet Take 1,000 mcg by mouth daily.     [START ON 06/16/2024] zaleplon  (SONATA ) 10 MG capsule Take 2 capsules (20 mg total) by mouth at bedtime. 60 capsule 1   No current facility-administered medications for this visit.     Musculoskeletal: Strength & Muscle Tone: within normal limits Gait & Station: normal Patient leans: N/A  Psychiatric Specialty Exam: Review of Systems  Psychiatric/Behavioral:  Positive for decreased concentration, dysphoric mood and sleep disturbance. Negative for agitation, behavioral problems, confusion, hallucinations, self-injury and suicidal ideas. The patient is not nervous/anxious and is not hyperactive.   All other systems reviewed and are negative.   Blood pressure 130/74, pulse 85, temperature 98.3 F (36.8 C), temperature source Temporal, height 5' 8 (1.727 m), weight 294 lb 12.8 oz (133.7 kg).Body mass index is 44.82 kg/m.  General Appearance: Well Groomed  Eye Contact:  Good  Speech:  Clear and Coherent  Volume:  Normal  Mood:  good  Affect:  Appropriate, Congruent, and calm  Thought Process:  Coherent   Orientation:  Full (Time, Place, and Person)  Thought Content: Logical   Suicidal Thoughts:  No  Homicidal Thoughts:  No  Memory:  Immediate;   Good  Judgement:  Good  Insight:  Good  Psychomotor Activity:  Normal, Normal tone, no rigidity, no resting/postural tremors, no tardive dyskinesia    Concentration:  Concentration: Good and Attention Span: Good  Recall:  Good  Fund of Knowledge: Good  Language: Good  Akathisia:  No  Handed:  Right  AIMS (if indicated): 0   Assets:  Communication Skills Desire for Improvement  ADL's:  Intact  Cognition: WNL  Sleep:  Good   Screenings: GAD-7    Advertising Copywriter from 04/09/2024 in [facility] Office Visit from 02/13/2024 in [facility] Office Visit from 12/19/2023 in [facility] Counselor from 07/12/2023 in [facility] Office Visit from 06/08/2023 in [facility]  Total GAD-7 Score 9 7 7 6 7    PHQ2-9    Flowsheet Row Counselor from 04/09/2024 in [facility] Office Visit from  02/13/2024 in [facility] Office Visit from 12/19/2023 in [facility] Office Visit from 09/25/2023 in [facility] Counselor from 07/12/2023 in [facility]  PHQ-2 Total Score 2 2 3 2 2   PHQ-9 Total Score 9 9 10 9 9    Flowsheet Row Office Visit from 02/13/2024 in [facility] Office Visit from 12/19/2023 in [facility] ED from 10/17/2023 in [facility] Emergency Department at [facility]  C-SSRS RISK CATEGORY No Risk Error: Q3, 4, or 5 should not be populated when Q2 is No No Risk      Assessment and Plan:  Martha [name] is a 29 y.o. year old female with a history of depression, anxiety, ADHD, migraine, GERD, who presents for follow up appointment for below.      2. PTSD (post-traumatic stress disorder) 3. MDD (major depressive disorder), recurrent episode, mild 4. GAD (generalized anxiety disorder) She describes a childhood marked by emotional neglect --receiving material items such as toys but lacking emotional nurturing. Her mother frequently told her to lose weight as a child and she has a fear of abandonment. She has also experienced significant losses, including the death of maternal grandmother due to COVID-19, and paternal grandmother with alzheimer in Sept 2025.  History: Tx from [facility]. Depression, anxiety, insomnia for many years. admitted once in 2013 for depression. Originally on Pristiq  100 mg daily, buspirone  30 mg qhs, zaleplon  20 mg qhs, Ritalin  10 mg daily.    TMS was discussed- if she obtains other insurance (currently on Medicaid)  Although she reports occasional down mood with passive SI in the context of occasional stress at work, and rainy weather, her mood has been overall manageable since the previous visit.  She is working toward moving into a mobile home with her fianc and reports experiencing less stress at her current job.  It is also notable that she has a friend helpful her friend, who ended the relationship.  Will continue current medication regimen.  Will continue fluoxetine  to target PTSD, depression, anxiety along with quetiapine  as an active treatment for depression.  Will continue BuSpar  for anxiety, prazosin  for nightmares and disturbed sleep. She will continue to see Ms. Veva for therapy.   1. Insomnia, unspecified type - reportedly struggles with insomnia since age 80. She had CPAP study titration 07/2022.s/p surgery for deviated septum  Overall stable except around menstrual period.  She denies any nightmares since the previous  visit.  Will continue zaleplon  to target insomnia, and prazosin  for nightmares.   5. Attention deficit hyperactivity disorder (ADHD), unspecified ADHD type - neuropsych testing was consistent with ADHD. UDS negative 12/2022       Although she reports occasional difficulty since being at the current work, it has been overall manageable.  Will continue current dose of Ritalin  to target ADHD.   # high risk medication use She reports occasional dyskinesia like movement around her neck, although it was not noticeable during the visit.  Will continue to monitor this.     Last checked  EKG HR 63, QTc 433 msec 09/2023  Lipid panels 87 03/2020- Due  HbA1c 6.4 H  07/2023      Plan Continue fluoxetine  60 mg daily  Continue quetiapine  50 mg at night (on metformin, mounjaro) - monitor possible dyskinesia Continue buspirone  30 mg at night Continue prazosin  2 mg at night - monitor dizziness Continue  Zaleplon  20 mg at night as needed for sleep  Continue Ritalin  10 mg daily Next appointment: 3/9 at 4:30, IP - on metoprolol    Past trials of medication: sertraline, Pristiq , bupropion  (some adverse reaction in the past), Abilify (visual change)     The patient demonstrates the following risk factors for suicide: Chronic risk factors for suicide include: psychiatric disorder of depression, anxiety. Acute risk factors for suicide include: family or marital conflict, work related stress. Protective factors for this patient include: positive social support, coping skills and hope for the future. Considering these factors, the overall suicide risk at this point appears to be moderate, but not at imminent risk. Patient is appropriate for outpatient follow up.    Collaboration of Care: Collaboration of Care: Other reviewed notes in Epic  Patient/Guardian was advised Release of Information must be obtained prior to any record release in order to collaborate their care with an outside provider. Patient/Guardian was  advised if they have not already done so to contact the registration department to sign all necessary forms in order for us  to release information regarding their care.   Consent: Patient/Guardian gives verbal consent for treatment and assignment of benefits for services provided during this visit. Patient/Guardian expressed understanding and agreed to proceed.    Katheren Sleet, MD 06/10/2024, 5:06 PM     [1]  Allergies Allergen Reactions   Doxycycline Other (See Comments)   Lamisil [Terbinafine]    Wellbutrin  [Bupropion ] Other (See Comments)    Suicidal Ideation    Wound Dressing Adhesive Hives    I welp up   "

## 2024-06-06 ENCOUNTER — Ambulatory Visit (INDEPENDENT_AMBULATORY_CARE_PROVIDER_SITE_OTHER): Admitting: Professional Counselor

## 2024-06-06 DIAGNOSIS — F330 Major depressive disorder, recurrent, mild: Secondary | ICD-10-CM | POA: Diagnosis not present

## 2024-06-06 DIAGNOSIS — F909 Attention-deficit hyperactivity disorder, unspecified type: Secondary | ICD-10-CM

## 2024-06-06 DIAGNOSIS — F411 Generalized anxiety disorder: Secondary | ICD-10-CM | POA: Diagnosis not present

## 2024-06-06 NOTE — Progress Notes (Unsigned)
" °  THERAPIST PROGRESS NOTE  Session Time: 4:00 PM - 4:53 PM   Participation Level: Active  Behavioral Response: Well Groomed, Alert, Euthymic  Type of Therapy: Individual Therapy  Treatment Goals addressed: Active OP Depression  LTG: I'd just be able to cope with the stress because I can't. (Progressing)                Start:  08/16/23    Expected End:  08/14/24    Goal Note Reviewed 04/09/24 - I'd say I've improved a little bit. It's not as, like I can see a little in my health but it's switched places, like having more tension in my neck. Mentally I'm not as exhausted but my body will feel it later.    STG: Coping. To improve ability to cope AEB reduction in breakdowns/crying spells/etc. by using coping skills over the next 12 weeks (Progressing)  Goal Note Reviewed 04/09/24 - Quite a bit better. I'm not breaking down as much and I'm able to process the feelings more.  ProgressTowards Goals: Progressing  Interventions: Motivational Interviewing and Supportive  Summary: Martha [name] is a 28 y.o. female who presents with a history of anxiety, depression, trauma, and ADHD. She appeared alert and oriented x5. She stated the holidays went well. Martha [name] noted plans for the upcoming year, particularly around finding a home with her partner and getting married. She reported things are going well at work and she is officially done with [employer].   Therapist Response: Conducted session with [facility]. Began session with check-in/update since previous session. Utilized empathetic and reflective listening. Used open-ended questions to facilitate discussion and summarized Martha [name]'s thoughts/feelings. Discussed upcoming plans for the year, finding housing, and working on wedding plans. Scheduled additional appointment and concluded session.   Suicidal/Homicidal: No  Plan: Return again in 5 weeks.  Diagnosis: MDD (major depressive disorder), recurrent episode, mild  GAD (generalized anxiety  disorder)  Attention deficit hyperactivity disorder (ADHD), unspecified ADHD type  Collaboration of Care: Medication Management AEB chart review  Patient/Guardian was advised Release of Information must be obtained prior to any record release in order to collaborate their care with an outside provider. Patient/Guardian was advised if they have not already done so to contact the registration department to sign all necessary forms in order for us  to release information regarding their care.   Consent: Patient/Guardian gives verbal consent for treatment and assignment of benefits for services provided during this visit. Patient/Guardian expressed understanding and agreed to proceed.   Martha [name], [facility] 06/06/2024  "

## 2024-06-10 ENCOUNTER — Ambulatory Visit (INDEPENDENT_AMBULATORY_CARE_PROVIDER_SITE_OTHER): Admitting: Psychiatry

## 2024-06-10 ENCOUNTER — Other Ambulatory Visit: Payer: Self-pay

## 2024-06-10 ENCOUNTER — Encounter: Payer: Self-pay | Admitting: Psychiatry

## 2024-06-10 VITALS — BP 130/74 | HR 85 | Temp 98.3°F | Ht 68.0 in | Wt 294.8 lb

## 2024-06-10 DIAGNOSIS — F4310 Post-traumatic stress disorder, unspecified: Secondary | ICD-10-CM

## 2024-06-10 DIAGNOSIS — F411 Generalized anxiety disorder: Secondary | ICD-10-CM

## 2024-06-10 DIAGNOSIS — F909 Attention-deficit hyperactivity disorder, unspecified type: Secondary | ICD-10-CM

## 2024-06-10 DIAGNOSIS — F330 Major depressive disorder, recurrent, mild: Secondary | ICD-10-CM | POA: Diagnosis not present

## 2024-06-10 DIAGNOSIS — G4700 Insomnia, unspecified: Secondary | ICD-10-CM | POA: Diagnosis not present

## 2024-06-10 MED ORDER — ZALEPLON 10 MG PO CAPS: 20.0000 mg | ORAL_CAPSULE | Freq: Every day | ORAL | 1 refills | Status: AC

## 2024-06-10 MED ORDER — BUSPIRONE HCL 30 MG PO TABS: 30.0000 mg | ORAL_TABLET | Freq: Every day | ORAL | 1 refills | Status: AC

## 2024-06-10 MED ORDER — METHYLPHENIDATE HCL 10 MG PO TABS: 10.0000 mg | ORAL_TABLET | Freq: Every day | ORAL | 0 refills | Status: AC

## 2024-06-10 MED ORDER — PRAZOSIN HCL 2 MG PO CAPS: 2.0000 mg | ORAL_CAPSULE | Freq: Every day | ORAL | 0 refills | Status: AC

## 2024-06-10 NOTE — Patient Instructions (Signed)
 Continue fluoxetine  60 mg daily  Continue quetiapine  50 mg at night Continue buspirone  30 mg at night Continue prazosin  2 mg at night  Continue Zaleplon  20 mg at night as needed for sleep  Continue Ritalin  10 mg daily Next appointment: 3/9 at 4:30

## 2024-07-09 ENCOUNTER — Ambulatory Visit: Admitting: Professional Counselor

## 2024-08-05 ENCOUNTER — Ambulatory Visit: Admitting: Psychiatry
